# Patient Record
Sex: Male | Born: 1940 | Race: Black or African American | Hispanic: No | Marital: Married | Smoking: Former smoker
Health system: Southern US, Community
[De-identification: ages and names within clinical notes are randomized; demographics above are authoritative.]

## PROBLEM LIST (undated history)

## (undated) DIAGNOSIS — I10 Essential (primary) hypertension: Secondary | ICD-10-CM

## (undated) DIAGNOSIS — K859 Acute pancreatitis without necrosis or infection, unspecified: Secondary | ICD-10-CM

## (undated) DIAGNOSIS — I219 Acute myocardial infarction, unspecified: Secondary | ICD-10-CM

## (undated) DIAGNOSIS — G4730 Sleep apnea, unspecified: Secondary | ICD-10-CM

## (undated) DIAGNOSIS — J45909 Unspecified asthma, uncomplicated: Secondary | ICD-10-CM

## (undated) DIAGNOSIS — E785 Hyperlipidemia, unspecified: Secondary | ICD-10-CM

## (undated) DIAGNOSIS — L309 Dermatitis, unspecified: Secondary | ICD-10-CM

## (undated) DIAGNOSIS — I2510 Atherosclerotic heart disease of native coronary artery without angina pectoris: Secondary | ICD-10-CM

## (undated) DIAGNOSIS — Z720 Tobacco use: Secondary | ICD-10-CM

## (undated) DIAGNOSIS — N182 Chronic kidney disease, stage 2 (mild): Secondary | ICD-10-CM

## (undated) DIAGNOSIS — G629 Polyneuropathy, unspecified: Secondary | ICD-10-CM

## (undated) DIAGNOSIS — I509 Heart failure, unspecified: Secondary | ICD-10-CM

## (undated) DIAGNOSIS — M109 Gout, unspecified: Secondary | ICD-10-CM

## (undated) DIAGNOSIS — N183 Chronic kidney disease, stage 3 unspecified: Secondary | ICD-10-CM

## (undated) DIAGNOSIS — K5790 Diverticulosis of intestine, part unspecified, without perforation or abscess without bleeding: Secondary | ICD-10-CM

## (undated) DIAGNOSIS — I739 Peripheral vascular disease, unspecified: Secondary | ICD-10-CM

## (undated) DIAGNOSIS — R51 Headache: Secondary | ICD-10-CM

## (undated) HISTORY — DX: Dermatitis, unspecified: L30.9

## (undated) HISTORY — PX: TONSILLECTOMY: SUR1361

## (undated) HISTORY — PX: ADENOIDECTOMY: SUR15

## (undated) HISTORY — DX: Gout, unspecified: M10.9

## (undated) HISTORY — DX: Chronic kidney disease, stage 3 unspecified: N18.30

## (undated) HISTORY — DX: Sleep apnea, unspecified: G47.30

## (undated) HISTORY — DX: Hyperlipidemia, unspecified: E78.5

## (undated) HISTORY — DX: Chronic kidney disease, stage 2 (mild): N18.2

## (undated) HISTORY — DX: Polyneuropathy, unspecified: G62.9

## (undated) HISTORY — PX: FEMORAL-FEMORAL BYPASS GRAFT: SHX936

## (undated) HISTORY — DX: Unspecified asthma, uncomplicated: J45.909

## (undated) HISTORY — DX: Acute pancreatitis without necrosis or infection, unspecified: K85.90

## (undated) HISTORY — PX: REPLACEMENT TOTAL HIP W/  RESURFACING IMPLANTS: SUR1222

---

## 2002-04-07 HISTORY — PX: CORONARY ANGIOPLASTY WITH STENT PLACEMENT: SHX49

## 2004-03-25 ENCOUNTER — Encounter (HOSPITAL_COMMUNITY): Admission: RE | Admit: 2004-03-25 | Discharge: 2004-06-23 | Payer: Self-pay | Admitting: Internal Medicine

## 2004-06-03 ENCOUNTER — Ambulatory Visit (HOSPITAL_COMMUNITY): Admission: RE | Admit: 2004-06-03 | Discharge: 2004-06-03 | Payer: Self-pay | Admitting: Internal Medicine

## 2004-08-19 ENCOUNTER — Encounter: Admission: RE | Admit: 2004-08-19 | Discharge: 2004-08-19 | Payer: Self-pay | Admitting: Sports Medicine

## 2004-09-16 ENCOUNTER — Ambulatory Visit (HOSPITAL_COMMUNITY): Admission: RE | Admit: 2004-09-16 | Discharge: 2004-09-16 | Payer: Self-pay | Admitting: Internal Medicine

## 2004-11-08 ENCOUNTER — Encounter: Admission: RE | Admit: 2004-11-08 | Discharge: 2004-11-08 | Payer: Self-pay | Admitting: Orthopedic Surgery

## 2005-01-21 ENCOUNTER — Inpatient Hospital Stay (HOSPITAL_COMMUNITY): Admission: RE | Admit: 2005-01-21 | Discharge: 2005-01-24 | Payer: Self-pay | Admitting: Orthopedic Surgery

## 2006-02-13 ENCOUNTER — Emergency Department (HOSPITAL_COMMUNITY): Admission: EM | Admit: 2006-02-13 | Discharge: 2006-02-13 | Payer: Self-pay | Admitting: Emergency Medicine

## 2006-05-27 ENCOUNTER — Encounter: Admission: RE | Admit: 2006-05-27 | Discharge: 2006-05-27 | Payer: Self-pay | Admitting: Internal Medicine

## 2006-07-28 HISTORY — PX: DOPPLER ECHOCARDIOGRAPHY: SHX263

## 2007-01-18 ENCOUNTER — Inpatient Hospital Stay (HOSPITAL_COMMUNITY): Admission: EM | Admit: 2007-01-18 | Discharge: 2007-01-20 | Payer: Self-pay | Admitting: Emergency Medicine

## 2007-09-20 ENCOUNTER — Ambulatory Visit (HOSPITAL_COMMUNITY): Admission: RE | Admit: 2007-09-20 | Discharge: 2007-09-20 | Payer: Self-pay | Admitting: Cardiology

## 2007-09-20 HISTORY — PX: LOWER EXTREMITY ANGIOGRAM: SHX5955

## 2009-05-08 DIAGNOSIS — K5790 Diverticulosis of intestine, part unspecified, without perforation or abscess without bleeding: Secondary | ICD-10-CM

## 2009-05-08 HISTORY — DX: Diverticulosis of intestine, part unspecified, without perforation or abscess without bleeding: K57.90

## 2009-05-21 ENCOUNTER — Ambulatory Visit (HOSPITAL_COMMUNITY): Admission: RE | Admit: 2009-05-21 | Discharge: 2009-05-21 | Payer: Self-pay | Admitting: Gastroenterology

## 2010-06-03 ENCOUNTER — Emergency Department (HOSPITAL_COMMUNITY)
Admission: EM | Admit: 2010-06-03 | Discharge: 2010-06-03 | Disposition: A | Discharge status: Home / Self Care | Payer: Medicare Other | Attending: Emergency Medicine | Admitting: Emergency Medicine

## 2010-06-03 DIAGNOSIS — E119 Type 2 diabetes mellitus without complications: Secondary | ICD-10-CM | POA: Insufficient documentation

## 2010-06-03 DIAGNOSIS — E7800 Pure hypercholesterolemia, unspecified: Secondary | ICD-10-CM | POA: Insufficient documentation

## 2010-06-03 DIAGNOSIS — I10 Essential (primary) hypertension: Secondary | ICD-10-CM | POA: Insufficient documentation

## 2010-06-03 DIAGNOSIS — S61409A Unspecified open wound of unspecified hand, initial encounter: Secondary | ICD-10-CM | POA: Insufficient documentation

## 2010-06-03 DIAGNOSIS — Y990 Civilian activity done for income or pay: Secondary | ICD-10-CM | POA: Insufficient documentation

## 2010-06-03 DIAGNOSIS — Y929 Unspecified place or not applicable: Secondary | ICD-10-CM | POA: Insufficient documentation

## 2010-06-03 DIAGNOSIS — I519 Heart disease, unspecified: Secondary | ICD-10-CM | POA: Insufficient documentation

## 2010-06-03 DIAGNOSIS — W268XXA Contact with other sharp object(s), not elsewhere classified, initial encounter: Secondary | ICD-10-CM | POA: Insufficient documentation

## 2010-06-26 LAB — GLUCOSE, CAPILLARY
Glucose-Capillary: 70 mg/dL (ref 70–99)
Glucose-Capillary: 80 mg/dL (ref 70–99)

## 2010-08-20 NOTE — H&P (Signed)
Zachary Hoffman, Zachary Hoffman               ACCOUNT NO.:  [id number]   MEDICAL RECORD NO.:  [id number]          PATIENT TYPE:  INP   LOCATION:  6738                         FACILITY:  [facility]   PHYSICIAN:  [name], M.D.    DATE OF BIRTH:  [birth year]   DATE OF ADMISSION:  01/17/2007  DATE OF DISCHARGE:                              HISTORY & PHYSICAL   PRESENTING COMPLAINT:  Nausea and gaseous bloating.   HISTORY OF PRESENTING ILLNESS:  Zachary Hoffman is a 70 year old African  American gentleman with past medical history significant for type 2  diabetes mellitus, hypertension, coronary artery disease, peripheral  vascular disease, osteoarthritis of the hips and knees, tobacco abuse,  and peripheral neuropathy.  He presented to the emergency room at [facility] with 1-day history of sudden onset nausea, gaseous  bloating of the abdomen, heartburn and belching.  He stated he had one  episode of vomiting of a yellowish material without any blood.  He  denied any abdominal pain.  No diarrhea or constipation but he recall  eating a lot of macaroni and cheese and believes this blocked him up.  He had no melena or hematochezia.  He denied having any chest pain,  shortness of breath, orthopnea or PND.  He denies using any alcohol or  nonsteroidal anti-inflammatory drugs.  He had no fevers or chills.  At  the emergency room, he had persistent vomiting and had to have an NG  tube placed.  His vital signs were stable at the emergency room and  initial laboratory data did show slightly elevated creatinine at 1.74.  His liver function tests essentially were within normal limits.  Point-  of-care CPK was also normal and troponin.  Abdominal x-ray, however,  revealed small bowel with gaseous distention and air-fluid levels  suggestive of small-bowel obstruction.  He was therefore admitted to the  hospital for further evaluation and appropriate management.   PAST MEDICAL HISTORY:  1. Type 2 diabetes  mellitus.  His last hemoglobin A1c on Aug 17, 2006,      was 8.12.  2. Hypertension, systemic.  3. Coronary artery disease.  He is usually followed by Dr. Adrian Prows.      His last 2-D echocardiogram was July 28, 2006, with normal LV      systolic function and diastolic dysfunction.  Stress Myoview at      that time revealed no ischemia and a low-risk scan.  4. Tobacco abuse.  He continues to smoke cigarettes, about five per      day.  5. Peripheral vascular disease.  6. Arthritis of the hips and knees status post left total hip      replacement.  7. Hyperlipidemia.   PAST SURGICAL HISTORY:  1. Angioplasty and stent.  2. Femoral artery bypass surgery.  3. Right leg stent.  4. Left total hip replacement.   MEDICATION HISTORY:  He is on:  1. Aspirin 325 mg once a day.  2. Accupril 40 mg daily.  3. Niaspan 500 mg daily.  4. Nexium 40 mg daily.  5. Toprol-XL 100 mg daily.  6. Albuterol MDI two puffs q.6h. p.r.n.  7. Darvocet-N 100 one p.o. q.12h. p.r.n.  8. Januvia 100 mg daily.  9. Flexeril 10 mg t.i.d. p.r.n.  10.Lantus insulin 60 units q.h.s.  11.Lyrica 50 mg t.i.d.  12.Apidra insulin 10 units b.i.d. a.c.  13.Cialis 20 mg daily p.r.n.  14.Zocor 20 mg daily.  15.Azor 10/20 one p.o. daily.  16.Reglan 5 mg t.i.d. a.c.  17.Lasix 40 mg b.i.d.  18.Nasacort AQ two sprays each nostril once a day.   ALLERGIES:  He says AVANDIA causes hepatitis.   FAMILY HISTORY AND SOCIAL HISTORY:  He is married and lives with his  wife.  He smokes five cigarettes per day and has done so for the last 40  years.  He denies any use of alcohol or illicit drugs.  His father is  deceased from cancer.  His mother died from a stroke.  He has one  sibling with diabetes and has three healthy children.   REVIEW OF SYSTEMS:  Essentially as above.   PHYSICAL EXAMINATION:  He is lying a hospital bed, not in acute  respiratory or painful distress.  He is not pale.  He is not icteric.  He is not cyanosed.   He is well-hydrated.  INITIAL VITAL SIGNS:  Show a blood pressure of 160/83, heart rate of 97,  respiratory rate of 20, temperature 98.1 and O2 saturation on room air  is 96%.  HEENT:  Pupils are equal and react to light and accommodation.  NECK:  Supple with no elevated JVD, no cervical lymphadenopathy, no  carotid bruit.  CHEST:  Shows good air entry bilaterally with no rales, rhonchi or  wheezes.  Heart sounds 1 and 2 are heard.  No S3 gallop.  ABDOMEN:  Obese, soft, nontender, no palpable masses.  Bowel sounds are present.  EXTREMITIES:  Shows no edema, no calf tenderness or swelling.  Peripheral pulses are weak.  CENTRAL NERVOUS SYSTEM:  He is alert, oriented x2 with no focal  neurological deficits.   LABORATORY DATA:  Shows a white count of 8.4, hemoglobin 13.8, platelet  count 304, hematocrit 42.3.  Sodium is 144, chloride 109, potassium 3.8,  bicarbonate of 27, BUN is 18, creatinine 1.44, and glucose of 140.  AST  is 25, ALT 27, alkaline phosphatase 96, and total bilirubin is 0.9.  Lipase is 14, albumin is 4.3.  CPK and troponin x2 are normal.  Total  cholesterol of 83, LDL 48, HDL 21, triglycerides of 70.  Calcium is 9.1.  Chest x-ray reported as showing no acute infiltrates.  Abdominal x-ray:  Bowel gas pattern compatible with small-bowel obstruction.  Urinalysis  is negative for leukocyte esterase or nitrites.   ASSESSMENT:  Zachary Hoffman is a 70 year old African American  gentleman with past medical history significant for coronary artery  disease, systemic hypertension, diabetes mellitus, peripheral vascular  disease.  His last colonoscopy was in 2005 and according to him was  normal, performed in [place].  He now presents with symptoms and signs  suggestive of small-bowel obstruction, most likely due to ileus.   ADMISSION DIAGNOSIS:  1. Small-bowel ileus.  2. Type 2 diabetes mellitus.  3. Systemic hypertension.  4. Gastroesophageal reflux disease.   PLAN OF  CARE:  He will be admitted to a telemetry bed.  NG tube suction  will be performed until stable, and he will be started on clear liquids  and advanced as tolerated.  CBG is to be checked a.c. and h.s. and  covered with sliding scale NovoLog insulin.  His home medication will be  continued as above.  If he fails to respond to this conservative line of  treatment, a consult will be requested with gastroenterology to proceed  with endoscopy during hospitalization; otherwise, this can be performed  in the outpatient once he is more stable.  This plan of care has been  discussed with him and his questions answered.      [name], M.D.  Electronically Signed     EA/MEDQ  D:  01/18/2007  T:  01/19/2007  Job:  [id number]

## 2010-08-20 NOTE — Cardiovascular Report (Signed)
NAME:  Zachary Hoffman, Zachary Hoffman               ACCOUNT NO.:  [id number]   MEDICAL RECORD NO.:  [id number]          PATIENT TYPE:  AMB   LOCATION:  SDS                          FACILITY:  [facility]   PHYSICIAN:  Ulice Dash R. Einar Gip, MD       DATE OF BIRTH:  22-Jun-1940   DATE OF PROCEDURE:  09/20/2007  DATE OF DISCHARGE:                            CARDIAC CATHETERIZATION   ATTENDING PHYSICIAN:  [name], M.D.   PROCEDURES PERFORMED:  1. Pelvic aortogram.  2. Left iliac arteriogram with left femoral runoff.   INDICATIONS:  [name] is a 58-year gentleman with history of  smoking which he has recently quit and history of coronary artery  disease with a negative stress test in January 2009 who has been  complaining of left hip pain and left leg pain.  He has previously  undergone left femoro-femoral bypass surgery in [place], details of  which were not known.  He had undergone outpatient Doppler evaluation  which had suggested stenosis at the inflow of the graft of greater than  50%.  With his symptoms of claudication and new finding of stenosis  proximal to the graft or at the insertion of the proximal segment of the  graft he was brought to the catheterization lab to evaluate his  peripheral anatomy.   Pelvic Aortogram:  Pelvic aortogram revealed aortoiliac bifurcation to  be widely patent.  There was mild calcification.  The left external  iliac artery showed 10% to 20% luminal irregularity but was otherwise  widely patent.   Left iliac arteriogram with left femoral runoff:  The left femoral  runoff revealed that there was mild luminal irregularity in the left  iliac artery and left common femoral artery, and the left SFA was  occluded.  This was bypassed from left femoro-femoral bypass Dacron  graft.  This graft is widely patent.  The insertions both proximally and  distally are widely patent.   At the level of the knee, the popliteal artery is widely patent.   Below the left knee,  the anterior tibial artery is occluded and the  tibioperoneal trunk has a smooth 50% stenosis.  The posterior tibial  artery is also occluded.  There is one-vessel runoff in the form of a  large peroneal artery.  This peroneal artery gives collaterals to both  posterior tibial and anterior tibial arteries at the level of the ankle.   RECOMMENDATIONS:  The Doppler velocities are probably elevated secondary  to tortuosity, but no significant stenosis is noted.  We will continue  present aggressive medical therapy.  I congratulated Zachary Hoffman for  having quit smoking recently.  He will follow up with Dr. [name]  for further management and evaluation of his medical issues.  I will see  him back in 6 months to a year for coronary-disease and peripheral-  arterial-disease followup.   A total of 50 mL of contrast was utilized for diagnostic angiography.   TECHNIQUE OF THE PROCEDURE:  Under usual sterile precautions using a 5-  French right femoral arterial access, a Omni Flush 5-French catheter was  utilized to perform pelvic aortogram.  The same catheter was utilized to  engage the left common iliac artery, and crossover was performed with a  Wholey wire.  The Omni Flush catheter  was placed into the left external iliac artery, and left external iliac  arteriogram with left femoral runoff was performed.  The catheter was  then pulled out of the body.  The patient tolerated the procedure well.  No immediate complication noted.      [name], MD  Electronically Signed     [name], MD  Electronically Signed    JRG/MEDQ  D:  09/20/2007  T:  09/20/2007  Job:  [id number]   cc:   [name], M.D.

## 2010-08-23 NOTE — H&P (Signed)
[name], [name]               ACCOUNT NO.:  [id number]   MEDICAL RECORD NO.:  [id number]          PATIENT TYPE:  INP   LOCATION:  NA                           FACILITY:  [facility]   PHYSICIAN:  Pietro Cassis. Alvan Dame, M.D.  DATE OF BIRTH:  Oct 04, 1940   DATE OF ADMISSION:  01/21/2005  DATE OF DISCHARGE:                                HISTORY & PHYSICAL   PRINCIPAL DIAGNOSIS:  Right hip osteoarthritis.   SECONDARY DIAGNOSES:  1.  Hypertension.  2.  History of myocardial infarction.  3.  History of peripheral vascular disease.  4.  History of insulin-dependent diabetes.  5.  History of thyroid disease.  6.  Osteoarthritis.  7.  Degenerative disk disease.  8.  History of peripheral vascular lower extremity arterial bypass surgery.   HISTORY OF PRESENT ILLNESS:  Zachary Hoffman is a 70 year old gentleman who is  here for evaluation. He had been followed in the office for right hip pain.  He moved from [place] earlier this year and had previously been seen  and evaluated for right hip pain. He has had evidence of persistent right  groin pain that had not relieved with any conservative measures including  cortisone injections. MRI revealed evidence of degenerative changes. He is  noted to have significant medial sided cyst formation. His MRI revealed  advanced osteoarthritic changes of the right hip with some iliopsoas  bursitis. Injections have failed to provide relief. Given these parameters,  he wished to proceed with surgical intervention as he has been relegated to  using a cane and walker at this point to get around without pain.   PAST MEDICAL HISTORY:  Hypertension, myocardial infarction, peripheral  vascular disease, insulin-dependent diabetes, thyroid disease,  osteoarthritis, and degenerative disk disease.   PAST SURGICAL HISTORY:  1.  Includes lower extremity vascular bypass.  2.  Coronary stent placement and right lower extremity stent placement.   FAMILY HISTORY:   Includes stroke, osteoarthritis, diabetes, and bone cancer.   ALLERGIES:  No known drug allergies.   CURRENT MEDICATIONS:  Ibuprofen, Vytorin, Norvasc, Nexium, aspirin,  bisulfate, Centrum, vitamin C, vitamin E, Humalog, Toprol XL, Lantus,  Niaspan, and Quinapril.   REVIEW OF SYSTEMS:  Is otherwise unremarkable for any recent problems with  upper respiratory, pulmonary, cardiac, gastrointestinal, or genitourinary  issues over the past 2 to 3 weeks.   PHYSICAL EXAMINATION:  GENERAL:  An awake, alert, oriented male today.  VITAL SIGNS:  Temperature 98.5, pulse 68, blood pressure 128/72.  HEENT:  Normal examination. He does have some loss of teeth but no evidence  of any obvious infection at this point. He otherwise has no evidence of any  facial asymmetry. Has normal speech pattern.  NECK:  Normal examination. Supple without any lymph nodes palpable. He has  no bruits detected.  CHEST:  Clear to auscultation bilaterally.  HEART:  Regular rate and rhythm with no murmurs detected.  ABDOMEN:  Soft, nontender with palpable bowel sounds.  EXTREMITIES:  Examination reveals again that he is relying on a walker at  this point to get around, with pain in the  groin with internal and external  rotation, limited range of motion.  NEUROLOGIC:  He is otherwise neurovascularly intact.  SKIN:  Intact and no evidence of cellulitis around the right hip.   LABORATORY DATA:  Radiographs reveal degenerative changes of the right hip  with cystic formation in the femoral head. The MRI supported these claims  with no evidence of any other significant pathology.   ASSESSMENT:  End stage right hip osteoarthritis, primarily medially, with  intra-femoral cystic formation.   PLAN:  [name] will be admitted for surgery on January 21, 2005 for right  hip surgery. He will undergo right total hip replacement with plans for  release of his iliopsoas bursa. Risks and benefits reviewed in the office  today.  Consent will be obtained based on this visit.      Pietro Cassis Alvan Dame, M.D.  Electronically Signed     MDO/MEDQ  D:  01/19/2005  T:  01/19/2005  Job:  [id number]

## 2010-08-23 NOTE — Op Note (Signed)
[name], [name] NO.:  [id number]   MEDICAL RECORD NO.:  [id number]          PATIENT TYPE:  INP   LOCATION:  X003                         FACILITY:  [facility]   PHYSICIAN:  Zachary Hoffman, M.D.  DATE OF BIRTH:  [birth year]   DATE OF PROCEDURE:  01/21/2005  DATE OF DISCHARGE:                                 OPERATIVE REPORT   PREOPERATIVE DIAGNOSIS:  Right hip osteoarthritis.   POSTOPERATIVE DIAGNOSIS:  Right hip osteoarthritis.   PROCEDURE:  Right total hip replacement.   COMPONENTS USED:  A DePuy hip system with a size 52 pinnacle cup, 36 neutral  metal liner, one cancellous bone screw. A Trilock hip stem with a size 11.3  lateralized offset, 36 plus 1.5 ball.   SURGEON:  Zachary Hoffman, M.D.   ASSISTANT:  Bobbye Morton, P.A.-C.   ANESTHESIA:  General.   ESTIMATED BLOOD LOSS:  350.   DRAINS:  Drains x1.   COMPLICATIONS:  None.   DISPOSITION:  Stable to recovery room.   INDICATIONS FOR PROCEDURE:  Zachary Hoffman is a pleasant 70 year old black male  who I have been following for progressive right hip pain. I initially  presented on consultation from out of town. He has failed all conservative  measures including cortisone injections which failed to provide longstanding  relief. Radiographs including MRI revealed significant advanced degenerative  changes in the right hip.   After reviewing the risks and benefits of neurovascular damage, dislocation,  component failure, the patient was consented for right total hip  replacement.   DESCRIPTION OF PROCEDURE:  The patient was brought to the operative theater.  Once adequate anesthesia and preoperative antibiotics, 1 gram of Ancef, the  patient was positioned in the left lateral decubitus position with the right  side up. The right lower extremity was then prepped and draped in a sterile  fashion following a prescrub. A lateral based incision was made for  posterior approach to the hip. The patient's  soft tissues including  subcutaneous tissue, fat, and muscle tendon were extremely tight. This made  exposure a little bit more of a challenge. The gluteus maximus was not  released though it probably would have helped in terms of exposure though  that is  a technique that is not performed readily at this point. I did  taken down a portion of the quadratus which tried to save.   The femoral head was dislocated, femoral neck osteotomy was made based off  the anatomic landmarks and preoperative templates. At this point, attention  was directed to the femur. Femoral exposure was obtained with appropriate  retractors. A starting box cut was made with about 20 degrees of  anteversion. I then placed a starting reamer then followed by broaching.  Broaching was carried up sequentially to initially a size 10. At this point,  attention was directed to the acetabulum.   Acetabular exposure was obtained including labrectomy and anterior  capsulotomy. Retractors placed and exposure obtained. Initial reaming was  carried out with a 45 reamer and then carried up sequentially with the mini  reamers to a 51  reamer. This had provided excellent bone preparation. The  final 52 cup was then impacted with excellent stable scratch fit. A single  screw was placed in the superior ileum with excellent purchase. A manual  cover was positioned followed by placement of a trial liner. At this point,  attention was redirected back to the femur where the size 10 broach was  placed. A trial reduction was carried out. The patient's leg lengths were  symmetric to that examined preoperatively with a let down leg. Hip stability  was excellent. Initially I used a standard offset neck. However, there was a  slight bit of impingement anteriorly and the iliotibial band appeared to be  loose so I went with the lateralized offset. This maintained leg lengths but  provided extra stability. The patient was stable in the sleep  position.  Adduction to 40 degrees, internal rotation to 80 degrees, stable in neutral  abduction, flexion to 80 degrees and internal rotation to 80 degrees. The  patient's tissues were tight enough that there was no shucking; however, leg  lengths were equal. The patient also tolerated external rotation and  extension. Given these parameters, I reexamined the proximal femur with a  size 10 broach and there was some torsional movement so I decided to broach  up to 11.3. This had excellent torsional stability. The final 11.3  lateralized stem was then brought onto the field.   The final acetabular liner, 36 neutral liner was positioned and impacted  into place without complications. The final 11.3 lateralized stem was then  impacted into position to the level of the broach placement. The final 36  plus 1.5 ball was then  impacted onto a clean and dry trunnion, hip reduced.  The hip was irrigated throughout the case and in particular at the end of  the case. A medium hemovac drain was placed. The posterior capsule was  reapproximated to the posterior leaflet. The remainder of the wound was  closed in layers with #1 Ethibond on the iliotibial band. The #1 Vicryl was  used on the gluteus maximus fascia. The wound was closed in layers with 4-0  Monocryl on the skin.      Zachary Hoffman, M.D.  Electronically Signed     MDO/MEDQ  D:  01/21/2005  T:  01/21/2005  Job:  [id number]

## 2010-08-23 NOTE — Discharge Summary (Signed)
Zachary Hoffman, Zachary Hoffman               ACCOUNT NO.:  [id number]   MEDICAL RECORD NO.:  [id number]          PATIENT TYPE:  INP   LOCATION:  [place]                         FACILITY:  [facility]   PHYSICIAN:  Pietro Cassis. Alvan Dame, M.D.  DATE OF BIRTH:  [birth year]   DATE OF ADMISSION:  01/21/2005  DATE OF DISCHARGE:  01/24/2005                                 DISCHARGE SUMMARY   ADMISSION DIAGNOSES:  1.  Right hip osteoarthritis.  2.  Hypertension.  3.  History of myocardial infarction.  4.  History of peripheral vascular disease.  5.  Insulin-dependent diabetes.  6.  Thyroid disease.  7.  Osteoarthritis.  8.  Degenerative disk disease.  9.  History of peripheral vascular lower extremity and arterial bypass      surgery.   DISCHARGE DIAGNOSES:  1.  Right hip osteoarthritis.  2.  Hypertension.  3.  History of myocardial infarction.  4.  History of peripheral vascular disease.  5.  Insulin-dependent diabetes.  6.  Thyroid disease.  7.  Osteoarthritis.  8.  Degenerative disk disease.  9.  History of peripheral vascular lower extremity and arterial bypass      surgery.   OPERATION:  On January 21, 2005 the patient underwent right total hip  replacement arthroplasty utilizing Dupuy hip system.  Verner Chol  assisted.   BRIEF HISTORY:  This 70 year old black male had been seen, but for  progressive problems concerning his right hip.  He has had cortisone  injection to the hip as well as taken anti-inflammatories in hopes to  prolong hip surgery due to his age.  He now is quite miserable, having  difficulty getting about.  After risks and benefits of surgery, and much  consideration by the patient, he agreed to go ahead with the above  procedure.   COURSE IN THE HOSPITAL:  The patient tolerated the surgical procedure quite  well.  Neurovascularly he remained intact in the operative extremity and  dressing and wound remained dry.  He had some itching postoperatively, we  were unsure if this  might be something given to him in the anesthesia, or  some of the analgesics, but we gave him Benadryl and this essentially took  care of the problem.  Eventually we weaned him from his PCA, Foley was out,  Hemovac was removed.  The patient was ambulating in the hall, doing really  quite well with physical therapy.  Home arrangements were made through  [place], home equipment was provided for the patient's safety.  It was felt  he could be maintained at home and as he was able to maintain 50% weight-  bearing, and arrangements were made for that discharge.  Laboratory values  in the hospital hematologically showed a CBC on admission completely within  normal limits, hemoglobin 13.3, hematocrit 40.0, final hemoglobin 10.5 with  hematocrit of 30.0.  Blood chemistries were normal.  Urinalysis was negative  for urinary tract infection.  X-ray showed osteoarthritis of the right hip  preop and no active disease was seen on chest x-ray.  No electrocardiogram  seen on this chart.  CONDITION ON DISCHARGE:  Improved, stable.   PLAN:  The patient is discharged home to continue with home health, 50%  weight-bearing in the operative extremity.  Use dry pressing p.r.n.  Continue home medications and diet.  We gave him a prescription for Vicodin  for pain, Robaxin as a muscle relaxant, and a prescription for Coumadin on  which he will stay for four weeks after date of surgery.  He will return to  see Dr. Alvan Dame in 2 to 2-1/2 weeks after the date of surgery.  He may shower  at this point in time.  Call with any problems or questions.      Zachary Hoffman.      Pietro Cassis Alvan Dame, M.D.  Electronically Signed    DLU/MEDQ  D:  02/03/2005  T:  02/03/2005  Job:  [id number]   cc:   Durenda Guthrie, MD  [facility]

## 2010-09-17 HISTORY — PX: OTHER SURGICAL HISTORY: SHX169

## 2011-01-02 LAB — TSH: TSH: 0.206 — ABNORMAL LOW

## 2011-01-02 LAB — BASIC METABOLIC PANEL
BUN: 19
CO2: 26
Calcium: 9.5
Chloride: 103
Creatinine, Ser: 1.7 — ABNORMAL HIGH
GFR calc Af Amer: 49 — ABNORMAL LOW
GFR calc non Af Amer: 41 — ABNORMAL LOW
Glucose, Bld: 301 — ABNORMAL HIGH
Potassium: 4.2
Sodium: 136

## 2011-01-02 LAB — T4: T4, Total: 6.2

## 2011-01-02 LAB — T3: T3, Total: 126.2 (ref 80.0–204.0)

## 2011-01-15 LAB — BASIC METABOLIC PANEL
BUN: 8
CO2: 26
Calcium: 9.3
Chloride: 107
Creatinine, Ser: 1.27
GFR calc Af Amer: >60
GFR calc non Af Amer: 57 — ABNORMAL LOW
Glucose, Bld: 143 — ABNORMAL HIGH
Potassium: 4.1
Sodium: 139

## 2011-01-16 LAB — CBC
HCT: 36 — ABNORMAL LOW
HCT: 42.3
Hemoglobin: 13.8
MCHC: 32.7
MCV: 86.1
Platelets: 216
Platelets: 304
RBC: 4.92
RDW: 16.2 — ABNORMAL HIGH
RDW: 16.6 — ABNORMAL HIGH
WBC: 5.5
WBC: 8.4

## 2011-01-16 LAB — COMPREHENSIVE METABOLIC PANEL
ALT: 27
AST: 18
AST: 25
Albumin: 3.2 — ABNORMAL LOW
Albumin: 4.3
Alkaline Phosphatase: 75
Alkaline Phosphatase: 96
BUN: 23
BUN: 7
CO2: 27
Calcium: 9.9
Chloride: 101
Chloride: 109
Creatinine, Ser: 1.74 — ABNORMAL HIGH
GFR calc Af Amer: 48 — ABNORMAL LOW
GFR calc Af Amer: >60
GFR calc non Af Amer: 40 — ABNORMAL LOW
Glucose, Bld: 190 — ABNORMAL HIGH
Potassium: 4
Potassium: 4.6
Sodium: 139
Sodium: 141
Total Bilirubin: 0.9
Total Bilirubin: 0.9
Total Protein: 6.2
Total Protein: 8.5 — ABNORMAL HIGH

## 2011-01-16 LAB — URINALYSIS, ROUTINE W REFLEX MICROSCOPIC
Bilirubin Urine: NEGATIVE
Glucose, UA: NEGATIVE
Hgb urine dipstick: NEGATIVE
Ketones, ur: NEGATIVE
Leukocytes, UA: NEGATIVE
Nitrite: NEGATIVE
Protein, ur: 30 — AB
Specific Gravity, Urine: 1.021
Urobilinogen, UA: 0.2
pH: 5.5

## 2011-01-16 LAB — LIPASE, BLOOD: Lipase: 14

## 2011-01-16 LAB — CARDIAC PANEL(CRET KIN+CKTOT+MB+TROPI)
Relative Index: 3.4 — ABNORMAL HIGH
Total CK: 128
Troponin I: 0.02

## 2011-01-16 LAB — DIFFERENTIAL
Basophils Absolute: 0
Basophils Relative: 0
Eosinophils Absolute: 0
Eosinophils Relative: 0
Lymphocytes Relative: 17
Lymphs Abs: 1.4
Monocytes Absolute: 0.5
Monocytes Relative: 7
Neutro Abs: 6.4
Neutrophils Relative %: 76

## 2011-01-16 LAB — TROPONIN I: Troponin I: 0.01

## 2011-01-16 LAB — BASIC METABOLIC PANEL
Chloride: 109
GFR calc Af Amer: 60 — ABNORMAL LOW
Potassium: 3.8

## 2011-01-16 LAB — LIPID PANEL
Cholesterol: 83
HDL: 21 — ABNORMAL LOW
Total CHOL/HDL Ratio: 4
VLDL: 14

## 2011-01-16 LAB — URINE MICROSCOPIC-ADD ON

## 2011-06-09 ENCOUNTER — Inpatient Hospital Stay (HOSPITAL_COMMUNITY)
Admission: EM | Admit: 2011-06-09 | Discharge: 2011-06-11 | DRG: 439 | Disposition: A | Discharge status: Home / Self Care | Payer: Medicare Other | Source: Ambulatory Visit | Attending: Internal Medicine | Admitting: Internal Medicine

## 2011-06-09 DIAGNOSIS — I509 Heart failure, unspecified: Secondary | ICD-10-CM | POA: Diagnosis present

## 2011-06-09 DIAGNOSIS — I252 Old myocardial infarction: Secondary | ICD-10-CM

## 2011-06-09 DIAGNOSIS — N189 Chronic kidney disease, unspecified: Secondary | ICD-10-CM | POA: Diagnosis present

## 2011-06-09 DIAGNOSIS — I129 Hypertensive chronic kidney disease with stage 1 through stage 4 chronic kidney disease, or unspecified chronic kidney disease: Secondary | ICD-10-CM | POA: Diagnosis present

## 2011-06-09 DIAGNOSIS — Z23 Encounter for immunization: Secondary | ICD-10-CM

## 2011-06-09 DIAGNOSIS — Z7982 Long term (current) use of aspirin: Secondary | ICD-10-CM

## 2011-06-09 DIAGNOSIS — N289 Disorder of kidney and ureter, unspecified: Secondary | ICD-10-CM

## 2011-06-09 DIAGNOSIS — K5730 Diverticulosis of large intestine without perforation or abscess without bleeding: Secondary | ICD-10-CM | POA: Diagnosis present

## 2011-06-09 DIAGNOSIS — I10 Essential (primary) hypertension: Secondary | ICD-10-CM

## 2011-06-09 DIAGNOSIS — I739 Peripheral vascular disease, unspecified: Secondary | ICD-10-CM | POA: Diagnosis present

## 2011-06-09 DIAGNOSIS — E119 Type 2 diabetes mellitus without complications: Secondary | ICD-10-CM | POA: Diagnosis present

## 2011-06-09 DIAGNOSIS — I219 Acute myocardial infarction, unspecified: Secondary | ICD-10-CM

## 2011-06-09 DIAGNOSIS — K859 Acute pancreatitis without necrosis or infection, unspecified: Principal | ICD-10-CM | POA: Diagnosis present

## 2011-06-09 DIAGNOSIS — Z96649 Presence of unspecified artificial hip joint: Secondary | ICD-10-CM

## 2011-06-09 DIAGNOSIS — Z794 Long term (current) use of insulin: Secondary | ICD-10-CM

## 2011-06-09 DIAGNOSIS — N179 Acute kidney failure, unspecified: Secondary | ICD-10-CM | POA: Diagnosis present

## 2011-06-09 HISTORY — DX: Heart failure, unspecified: I50.9

## 2011-06-09 HISTORY — DX: Essential (primary) hypertension: I10

## 2011-06-09 HISTORY — DX: Acute myocardial infarction, unspecified: I21.9

## 2011-06-09 HISTORY — DX: Tobacco use: Z72.0

## 2011-06-09 HISTORY — DX: Headache: R51

## 2011-06-09 HISTORY — DX: Peripheral vascular disease, unspecified: I73.9

## 2011-06-09 HISTORY — DX: Diverticulosis of intestine, part unspecified, without perforation or abscess without bleeding: K57.90

## 2011-06-09 HISTORY — DX: Atherosclerotic heart disease of native coronary artery without angina pectoris: I25.10

## 2011-06-10 ENCOUNTER — Emergency Department (HOSPITAL_COMMUNITY): Payer: Medicare Other

## 2011-06-10 ENCOUNTER — Inpatient Hospital Stay (HOSPITAL_COMMUNITY): Payer: Medicare Other

## 2011-06-10 ENCOUNTER — Encounter (HOSPITAL_COMMUNITY): Payer: Self-pay | Admitting: Emergency Medicine

## 2011-06-10 DIAGNOSIS — I10 Essential (primary) hypertension: Secondary | ICD-10-CM | POA: Diagnosis present

## 2011-06-10 DIAGNOSIS — N289 Disorder of kidney and ureter, unspecified: Secondary | ICD-10-CM | POA: Diagnosis present

## 2011-06-10 LAB — POCT I-STAT, CHEM 8
BUN: 22 mg/dL (ref 6–23)
Calcium, Ion: 1.27 mmol/L (ref 1.12–1.32)
Chloride: 106 mEq/L (ref 96–112)
HCT: 39 % (ref 39.0–52.0)
Potassium: 4.8 mEq/L (ref 3.5–5.1)
Sodium: 140 mEq/L (ref 135–145)

## 2011-06-10 LAB — LIPASE, BLOOD: Lipase: 737 U/L — ABNORMAL HIGH (ref 11–59)

## 2011-06-10 LAB — CBC
HCT: 37 % — ABNORMAL LOW (ref 39.0–52.0)
MCH: 28.4 pg (ref 26.0–34.0)
MCV: 83.3 fL (ref 78.0–100.0)
RDW: 14.5 % (ref 11.5–15.5)
WBC: 6.3 10*3/uL (ref 4.0–10.5)

## 2011-06-10 LAB — GLUCOSE, CAPILLARY
Glucose-Capillary: 277 mg/dL — ABNORMAL HIGH (ref 70–99)
Glucose-Capillary: 197 mg/dL — ABNORMAL HIGH (ref 70–99)
Glucose-Capillary: 260 mg/dL — ABNORMAL HIGH (ref 70–99)
Glucose-Capillary: 282 mg/dL — ABNORMAL HIGH (ref 70–99)

## 2011-06-10 LAB — URINALYSIS, ROUTINE W REFLEX MICROSCOPIC
Bilirubin Urine: NEGATIVE
Hgb urine dipstick: NEGATIVE
Ketones, ur: NEGATIVE mg/dL
Protein, ur: 30 mg/dL — AB
Urobilinogen, UA: 0.2 mg/dL (ref 0.0–1.0)

## 2011-06-10 LAB — HEPATIC FUNCTION PANEL
ALT: 21 U/L (ref 0–53)
Alkaline Phosphatase: 101 U/L (ref 39–117)
Bilirubin, Direct: <0.1 mg/dL (ref 0.0–0.3)

## 2011-06-10 LAB — URINE MICROSCOPIC-ADD ON

## 2011-06-10 LAB — DIFFERENTIAL
Eosinophils Absolute: 0.2 10*3/uL (ref 0.0–0.7)
Eosinophils Relative: 3 % (ref 0–5)
Lymphocytes Relative: 36 % (ref 12–46)
Lymphs Abs: 2.3 10*3/uL (ref 0.7–4.0)
Monocytes Absolute: 0.6 10*3/uL (ref 0.1–1.0)

## 2011-06-10 MED ORDER — NIACIN-SIMVASTATIN ER 1000-20 MG PO TB24: 1.0000 | ORAL_TABLET | Freq: Every day | ORAL | Status: DC

## 2011-06-10 MED ORDER — NIACIN ER (ANTIHYPERLIPIDEMIC) 500 MG PO TBCR
500.0000 mg | EXTENDED_RELEASE_TABLET | Freq: Every morning | ORAL | Status: DC
  Administered 2011-06-10 – 2011-06-11 (×2): 500 mg via ORAL
  Filled 2011-06-10 (×2): qty 1

## 2011-06-10 MED ORDER — SODIUM CHLORIDE 0.9 % IV SOLN: INTRAVENOUS | Status: DC

## 2011-06-10 MED ORDER — SENNA 8.6 MG PO TABS
1.0000 | ORAL_TABLET | Freq: Two times a day (BID) | ORAL | Status: DC
  Administered 2011-06-10 – 2011-06-11 (×3): 8.6 mg via ORAL
  Filled 2011-06-10 (×2): qty 1
  Filled 2011-06-10: qty 2
  Filled 2011-06-10: qty 1

## 2011-06-10 MED ORDER — INSULIN ASPART 100 UNIT/ML ~~LOC~~ SOLN
0.0000 [IU] | SUBCUTANEOUS | Status: DC
  Administered 2011-06-10: 8 [IU] via SUBCUTANEOUS
  Administered 2011-06-11 (×2): 2 [IU] via SUBCUTANEOUS

## 2011-06-10 MED ORDER — HEPARIN SODIUM (PORCINE) 5000 UNIT/ML IJ SOLN
5000.0000 [IU] | Freq: Three times a day (TID) | INTRAMUSCULAR | Status: DC
  Administered 2011-06-10 – 2011-06-11 (×3): 5000 [IU] via SUBCUTANEOUS
  Filled 2011-06-10 (×7): qty 1

## 2011-06-10 MED ORDER — ONDANSETRON HCL 4 MG PO TABS: 4.0000 mg | ORAL_TABLET | Freq: Four times a day (QID) | ORAL | Status: DC | PRN

## 2011-06-10 MED ORDER — CLOPIDOGREL BISULFATE 75 MG PO TABS
75.0000 mg | ORAL_TABLET | Freq: Every day | ORAL | Status: DC
  Administered 2011-06-10 – 2011-06-11 (×2): 75 mg via ORAL
  Filled 2011-06-10 (×2): qty 1

## 2011-06-10 MED ORDER — ACETAMINOPHEN 650 MG RE SUPP: 650.0000 mg | Freq: Four times a day (QID) | RECTAL | Status: DC | PRN

## 2011-06-10 MED ORDER — INSULIN ASPART 100 UNIT/ML ~~LOC~~ SOLN
0.0000 [IU] | SUBCUTANEOUS | Status: DC
  Administered 2011-06-10: 3 [IU] via SUBCUTANEOUS
  Filled 2011-06-10: qty 3

## 2011-06-10 MED ORDER — LINAGLIPTIN 5 MG PO TABS
5.0000 mg | ORAL_TABLET | Freq: Every day | ORAL | Status: DC
  Administered 2011-06-10 – 2011-06-11 (×2): 5 mg via ORAL
  Filled 2011-06-10 (×2): qty 1

## 2011-06-10 MED ORDER — PREGABALIN 50 MG PO CAPS
50.0000 mg | ORAL_CAPSULE | Freq: Three times a day (TID) | ORAL | Status: DC
  Administered 2011-06-10 – 2011-06-11 (×3): 50 mg via ORAL
  Filled 2011-06-10 (×3): qty 1

## 2011-06-10 MED ORDER — INSULIN GLARGINE 100 UNIT/ML ~~LOC~~ SOLN
50.0000 [IU] | Freq: Every day | SUBCUTANEOUS | Status: DC
Start: 2011-06-10 — End: 2011-06-11
  Administered 2011-06-10: 50 [IU] via SUBCUTANEOUS
  Filled 2011-06-10: qty 3

## 2011-06-10 MED ORDER — DOCUSATE SODIUM 100 MG PO CAPS
100.0000 mg | ORAL_CAPSULE | Freq: Two times a day (BID) | ORAL | Status: DC
  Administered 2011-06-10 – 2011-06-11 (×3): 100 mg via ORAL
  Filled 2011-06-10 (×3): qty 1

## 2011-06-10 MED ORDER — METOPROLOL SUCCINATE ER 100 MG PO TB24
100.0000 mg | ORAL_TABLET | Freq: Every day | ORAL | Status: DC
  Administered 2011-06-10 – 2011-06-11 (×2): 100 mg via ORAL
  Filled 2011-06-10 (×2): qty 1

## 2011-06-10 MED ORDER — NIACIN ER 500 MG PO CPCR
1000.0000 mg | ORAL_CAPSULE | Freq: Every day | ORAL | Status: DC
  Administered 2011-06-10: 1000 mg via ORAL
  Filled 2011-06-10 (×2): qty 2

## 2011-06-10 MED ORDER — AMLODIPINE BESYLATE 10 MG PO TABS
10.0000 mg | ORAL_TABLET | Freq: Every day | ORAL | Status: DC
  Administered 2011-06-10 – 2011-06-11 (×2): 10 mg via ORAL
  Filled 2011-06-10 (×2): qty 1

## 2011-06-10 MED ORDER — ACETAMINOPHEN 325 MG PO TABS: 650.0000 mg | ORAL_TABLET | Freq: Four times a day (QID) | ORAL | Status: DC | PRN

## 2011-06-10 MED ORDER — ONDANSETRON HCL 4 MG/2ML IJ SOLN
4.0000 mg | Freq: Once | INTRAMUSCULAR | Status: AC
  Administered 2011-06-10: 4 mg via INTRAVENOUS
  Filled 2011-06-10: qty 2

## 2011-06-10 MED ORDER — ASPIRIN EC 325 MG PO TBEC: 325.0000 mg | DELAYED_RELEASE_TABLET | Freq: Every day | ORAL | Status: DC

## 2011-06-10 MED ORDER — SIMVASTATIN 20 MG PO TABS
20.0000 mg | ORAL_TABLET | Freq: Every day | ORAL | Status: DC
  Administered 2011-06-10: 20 mg via ORAL
  Filled 2011-06-10 (×2): qty 1

## 2011-06-10 MED ORDER — PNEUMOCOCCAL VAC POLYVALENT 25 MCG/0.5ML IJ INJ
0.5000 mL | INJECTION | Freq: Once | INTRAMUSCULAR | Status: AC
  Administered 2011-06-10: 0.5 mL via INTRAMUSCULAR
  Filled 2011-06-10: qty 0.5

## 2011-06-10 MED ORDER — SODIUM CHLORIDE 0.9 % IV SOLN
INTRAVENOUS | Status: DC
  Administered 2011-06-10: 03:00:00 via INTRAVENOUS

## 2011-06-10 MED ORDER — SODIUM CHLORIDE 0.9 % IV SOLN
INTRAVENOUS | Status: AC
  Administered 2011-06-10: 1000 mL via INTRAVENOUS
  Administered 2011-06-11: 75 mL/h via INTRAVENOUS

## 2011-06-10 MED ORDER — PREGABALIN 50 MG PO CAPS
50.0000 mg | ORAL_CAPSULE | Freq: Every morning | ORAL | Status: DC
  Administered 2011-06-10: 50 mg via ORAL
  Filled 2011-06-10: qty 1

## 2011-06-10 MED ORDER — ONDANSETRON HCL 4 MG/2ML IJ SOLN: 4.0000 mg | Freq: Four times a day (QID) | INTRAMUSCULAR | Status: DC | PRN

## 2011-06-10 MED ORDER — ASPIRIN EC 325 MG PO TBEC
325.0000 mg | DELAYED_RELEASE_TABLET | Freq: Every day | ORAL | Status: DC
  Administered 2011-06-10 – 2011-06-11 (×2): 325 mg via ORAL
  Filled 2011-06-10 (×2): qty 1

## 2011-06-10 MED ORDER — MORPHINE SULFATE 2 MG/ML IJ SOLN
2.0000 mg | INTRAMUSCULAR | Status: DC | PRN
  Administered 2011-06-10 – 2011-06-11 (×2): 2 mg via INTRAVENOUS
  Filled 2011-06-10 (×2): qty 1

## 2011-06-10 MED ORDER — INSULIN ASPART 100 UNIT/ML ~~LOC~~ SOLN
0.0000 [IU] | Freq: Three times a day (TID) | SUBCUTANEOUS | Status: DC
  Filled 2011-06-10: qty 3

## 2011-06-10 MED ORDER — HYDROMORPHONE HCL PF 1 MG/ML IJ SOLN
1.0000 mg | Freq: Once | INTRAMUSCULAR | Status: AC
  Administered 2011-06-10: 1 mg via INTRAVENOUS
  Filled 2011-06-10: qty 1

## 2011-06-10 NOTE — ED Notes (Signed)
Provided pt with urinal for sample ° °

## 2011-06-10 NOTE — ED Notes (Signed)
Pt has been having abd pain since last Wednesday. Pt unsure of cause. Pt thinks the upper abd is swollen. Pt has some nausea and vomiting. Pt had a small amount of BM today.

## 2011-06-10 NOTE — ED Notes (Signed)
Pt snoring in stretcher in NAD, respirations even and unlabored.

## 2011-06-10 NOTE — H&P (Signed)
PCP:   Philis Fendt, MD, MD   Confirmed with pt   Chief Complaint:  Abdominal pain  HPI: 45yoM with h/o CAD s/p stenting, PAD s/p left femoro-femoral bypass, diabetes on insulin presents  with pancreatitis.   Pt is reliable historian and relates that epigastric pain developed last Wednesday and has  progressive gotten worse, described as burning/cramping, radiating into his back, although pt has  baseline LBP/arthritis. He has also had nausea, and "made himself vomit" on Friday. He also  endorses increased abdominal distention and feeling of fullness, but has been having BM's, last  were yesterday in the am and in the pm that were hard/constipated, and assoc'd with flatus. He has  been able to maintain some PO intake that does not apparently worsen the pain.   In the ED, vitals were stable, but lipase noted to be >700, with normal LFT's. Cr a bit up at 1.5  and glucose elevated to 250. CBC stable. UA negative. Pt admitted for pancreatitis.   He adamantly denies any alcohol intake at all, although he does endorse cigarettes. He denies any  h/o chronic biliary colic type pain. He's never had pancreatitis before. He denies fevers, chills,  sweats, cardiopulmonary symptoms. ROS otherwise negative.  Past Medical History  Diagnosis Date  . Diabetes mellitus     On insulin   . Hypertension   . CHF (congestive heart failure)     Echo 07/2006 with normal LV fxn, and diastolic dysfxn  . MI (myocardial infarction)     S/p stenting per patient. Last stress 04/2007 negative   . Peripheral arterial disease     S/p left femoro-femoral bypass. LE cath by Dr. Einar Gip 09/2007 without significant stenosis  . Tobacco abuse   . Diverticulosis 05/2009    Noted on screening colo     Past Surgical History  Procedure Date  . Replacement total hip w/  resurfacing implants     Left  . Femoral-femoral bypass graft     In [place], on the left. Patent on cath 09/2007    Medications:  HOME MEDS:   Well reconciled with pt  Prior to Admission medications   Medication Sig Start Date End Date Taking? Authorizing Provider  amLODipine (NORVASC) 10 MG tablet Take 10 mg by mouth daily.   Yes Historical Provider, MD  aspirin 325 MG EC tablet Take 325 mg by mouth daily.   Yes Historical Provider, MD  clopidogrel (PLAVIX) 75 MG tablet Take 75 mg by mouth daily.   Yes Historical Provider, MD  cyclobenzaprine (FLEXERIL) 10 MG tablet Take 10 mg by mouth 3 (three) times daily as needed. For muscle spasms   Yes Historical Provider, MD  furosemide (LASIX) 40 MG tablet Take 40 mg by mouth daily.   Yes Historical Provider, MD  HYDROcodone-acetaminophen (VICODIN) 5-500 MG per tablet Take 1 tablet by mouth every 6 (six) hours as needed. For pain   Yes Historical Provider, MD  insulin glargine (LANTUS) 100 UNIT/ML injection Inject 50 Units into the skin daily after supper.    Yes Historical Provider, MD  insulin glulisine (APIDRA) 100 UNIT/ML injection Inject 10 Units into the skin daily before supper.    Yes Historical Provider, MD  Iron-Vitamins (GERITOL PO) Take 1 capsule by mouth every morning.   Yes Historical Provider, MD  methocarbamol (ROBAXIN) 500 MG tablet Take 500 mg by mouth daily as needed. For muscle relaxer   Yes Historical Provider, MD  metoprolol succinate (TOPROL-XL) 100 MG 24 hr tablet  Take 100 mg by mouth daily. Take with or immediately following a meal.   Yes Historical Provider, MD  niacin (NIASPAN) 500 MG CR tablet Take 500 mg by mouth every morning.   Yes Historical Provider, MD  niacin-simvastatin [facility]) 1000-20 MG 24 hr tablet Take 1 tablet by mouth at bedtime.   Yes Historical Provider, MD  OMEGA-3 KRILL OIL PO Take 1 capsule by mouth every morning.   Yes Historical Provider, MD  pantoprazole (PROTONIX) 40 MG tablet Take 40 mg by mouth daily as needed. For acid reflux   Yes Historical Provider, MD  pregabalin (LYRICA) 50 MG capsule Take 50 mg by mouth every morning.   Yes Historical  Provider, MD  quinapril (ACCUPRIL) 40 MG tablet Take 40 mg by mouth at bedtime.   Yes Historical Provider, MD  sitaGLIPtin (JANUVIA) 100 MG tablet Take 100 mg by mouth daily.   Yes Historical Provider, MD   Allergies:  No Known Allergies  Social History:   reports that he has been smoking Cigarettes.  He has a 25 pack-year smoking history. He has never used smokeless tobacco. He reports that he does not drink alcohol or use illicit drugs.  Lives with wife, has grown children. Still active without cane or walker. Still smoking 1/2 ppd but denies any alcohol or drugs.   Family History: History reviewed. No pertinent family history.  Physical Exam: Filed Vitals:   06/10/11 0234 06/10/11 0353 06/10/11 0430 06/10/11 0500  BP: 123/59 122/67 118/70 129/73  Pulse: 64 70 72 67  Temp:      TempSrc:      Resp: 18 18    SpO2: 96% 99% 95% 99%   Blood pressure 129/73, pulse 67, temperature 98.5 F (36.9 C), temperature source Oral, resp. rate 18, SpO2 99.00%.  Gen: Younger than stated age appearing M in no distress, pleasant, able to relate history well.  Breathing comfortably, not in pain.  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 Pupils round and reactive, scleral muddying present. Mouth moist, with dentures in place,  normal Lungs: CTAB no w/c/r, normal air movement Heart: Regular and not tachycardic, no m/g, normal exam Abd: Distended moderately, but not peritoneal or rigid, minimal facial grimacing to palpation, no  bowel sounds heard. Negative Murphy sign.  Extrem: Warm, perfusing well, radials easily palpable, no BLE edema noted.  Neuro: Alert, attentive, oriented, pleasant, CN 2-12 intact without droop or slurring, moves  extremities and sits up in bed on his own. Grossly non-focal.    Labs & Imaging Results for orders placed during the hospital encounter of 06/09/11 (from the past 48 hour(s))  CBC     Status: Abnormal     Collection Time   06/10/11  2:29 AM      Component Value Range Comment   WBC 6.3  4.0 - 10.5 (K/uL)    RBC 4.44  4.22 - 5.81 (MIL/uL)    Hemoglobin 12.6 (*) 13.0 - 17.0 (g/dL)    HCT 37.0 (*) 39.0 - 52.0 (%)    MCV 83.3  78.0 - 100.0 (fL)    MCH 28.4  26.0 - 34.0 (pg)    MCHC 34.1  30.0 - 36.0 (g/dL)    RDW 14.5  11.5 - 15.5 (%)    Platelets 221  150 - 400 (K/uL)   DIFFERENTIAL     Status: Normal   Collection Time   06/10/11  2:29 AM  Component Value Range Comment   Neutrophils Relative 52  43 - 77 (%)    Neutro Abs 3.3  1.7 - 7.7 (K/uL)    Lymphocytes Relative 36  12 - 46 (%)    Lymphs Abs 2.3  0.7 - 4.0 (K/uL)    Monocytes Relative 9  3 - 12 (%)    Monocytes Absolute 0.6  0.1 - 1.0 (K/uL)    Eosinophils Relative 3  0 - 5 (%)    Eosinophils Absolute 0.2  0.0 - 0.7 (K/uL)    Basophils Relative 1  0 - 1 (%)    Basophils Absolute 0.0  0.0 - 0.1 (K/uL)   HEPATIC FUNCTION PANEL     Status: Normal   Collection Time   06/10/11  2:29 AM      Component Value Range Comment   Total Protein 7.2  6.0 - 8.3 (g/dL)    Albumin 3.5  3.5 - 5.2 (g/dL)    AST 19  0 - 37 (U/L)    ALT 21  0 - 53 (U/L)    Alkaline Phosphatase 101  39 - 117 (U/L)    Total Bilirubin 0.4  0.3 - 1.2 (mg/dL)    Bilirubin, Direct <0.1  0.0 - 0.3 (mg/dL)    Indirect Bilirubin NOT CALCULATED  0.3 - 0.9 (mg/dL)   LIPASE, BLOOD     Status: Abnormal   Collection Time   06/10/11  2:29 AM      Component Value Range Comment   Lipase 737 (*) 11 - 59 (U/L) REPEATED TO VERIFY  POCT I-STAT, CHEM 8     Status: Abnormal   Collection Time   06/10/11  2:51 AM      Component Value Range Comment   Sodium 140  135 - 145 (mEq/L)    Potassium 4.8  3.5 - 5.1 (mEq/L)    Chloride 106  96 - 112 (mEq/L)    BUN 22  6 - 23 (mg/dL)    Creatinine, Ser 1.50 (*) 0.50 - 1.35 (mg/dL)    Glucose, Bld 256 (*) 70 - 99 (mg/dL)    Calcium, Ion 1.27  1.12 - 1.32 (mmol/L)    TCO2 27  0 - 100 (mmol/L)    Hemoglobin 13.3  13.0 - 17.0 (g/dL)    HCT  39.0  39.0 - 52.0 (%)   URINALYSIS, ROUTINE W REFLEX MICROSCOPIC     Status: Abnormal   Collection Time   06/10/11  3:29 AM      Component Value Range Comment   Color, Urine YELLOW  YELLOW     APPearance CLEAR  CLEAR     Specific Gravity, Urine 1.017  1.005 - 1.030     pH 5.5  5.0 - 8.0     Glucose, UA 500 (*) NEGATIVE (mg/dL)    Hgb urine dipstick NEGATIVE  NEGATIVE     Bilirubin Urine NEGATIVE  NEGATIVE     Ketones, ur NEGATIVE  NEGATIVE (mg/dL)    Protein, ur 30 (*) NEGATIVE (mg/dL)    Urobilinogen, UA 0.2  0.0 - 1.0 (mg/dL)    Nitrite NEGATIVE  NEGATIVE     Leukocytes, UA NEGATIVE  NEGATIVE    URINE MICROSCOPIC-ADD ON     Status: Normal   Collection Time   06/10/11  3:29 AM      Component Value Range Comment   WBC, UA 0-2  <3 (WBC/hpf)    RBC / HPF 0-2  <3 (RBC/hpf)    No results found.  Impression Present on Admission:  .Pancreatitis, acute .Renal insufficiency .Diabetes mellitus .Hypertension  24yoM with h/o CAD s/p stenting, PAD s/p left femoro-femoral bypass, diabetes on insulin presents  with pancreatitis.   1. Pancreatitis: Pt seems reliable and adamantly refuses any alcohol intake, he also denies any  h/o pancreatitis, biliary colic, or gallstone disease. However, given negative alcohol intake,  gallstones is probably the most likely Dx.   - Symptomatic therapy, keep NPO and give IVF's, can advance to clears as tolerated, get RUQ u/s  and depending on results, may need GI consult, ERCP, etc.  - Plain film abdomen for abdominal distention and constipation to r/o obstruction.   2. Acute vs chronic kidney disease: Cr 1.5 is up from prior noted values of 1.2-1.4 which is  probably his baseline. Will give IVF's as above and trend for now.  - Holding home lasix, quinapril  3. Diabetes: On insulin and currently hyperglycemic. Will continue home lantus, add moderate SSI.  Continue home sitagliptin alternative med  4. HTN: Continue home amlodipine, ASA 325, niacin,  statin  5. H/o MI: Continue plavix, toprol  Regular bed, MC team 4 Presumed full code   Other plans as per orders.  Halie Gass 06/10/2011, 5:49 AM

## 2011-06-10 NOTE — ED Provider Notes (Signed)
History     CSN: [id number]  Arrival date & time 06/09/11  2345   First MD Initiated Contact with Patient 06/10/11 0217      Chief Complaint  Patient presents with  . Abdominal Pain    (Consider location/radiation/quality/duration/timing/severity/associated sxs/prior treatment) HPI This is a 71 year old black male with a six-day history of abdominal pain. The pain began in the epigastrium and is now located in the epigastrium and suprapubic regions. He states it feels like someone is stabbing him. The pain is moderate to severe. It has gotten worse. He feels that his abdomen is mildly distended. He has been nauseated but has not vomited. His appetite is still present but is unable to eat orally but likely due to nausea. He has not had diarrhea but has had decreased bowel movements. He denies dysuria or hematuria. He denies fevers or chills.   Past Medical History  Diagnosis Date  . Diabetes mellitus   . Hypertension   . CHF (congestive heart failure)   . MI (myocardial infarction)     Past Surgical History  Procedure Date  . Replacement total hip w/  resurfacing implants     History reviewed. No pertinent family history.  History  Substance Use Topics  . Smoking status: Not on file  . Smokeless tobacco: Not on file  . Alcohol Use: No      Review of Systems  All other systems reviewed and are negative.    Allergies  Review of patient's allergies indicates no known allergies.  Home Medications   Current Outpatient Rx  Name Route Sig Dispense Refill  . AMLODIPINE BESYLATE 10 MG PO TABS Oral Take 10 mg by mouth daily.    . ASPIRIN 325 MG PO TBEC Oral Take 325 mg by mouth daily.    Marland Kitchen CLOPIDOGREL BISULFATE 75 MG PO TABS Oral Take 75 mg by mouth daily.    . CYCLOBENZAPRINE HCL 10 MG PO TABS Oral Take 10 mg by mouth 3 (three) times daily as needed. For muscle spasms    . FUROSEMIDE 40 MG PO TABS Oral Take 40 mg by mouth daily.    Marland Kitchen HYDROCODONE-ACETAMINOPHEN 5-500  MG PO TABS Oral Take 1 tablet by mouth every 6 (six) hours as needed. For pain    . INSULIN GLARGINE 100 UNIT/ML [place] SOLN Subcutaneous Inject 50 Units into the skin daily.    . INSULIN GLULISINE 100 UNIT/ML [place] SOLN Subcutaneous Inject 10 Units into the skin 3 (three) times daily before meals.    Marland Kitchen GERITOL PO Oral Take 1 capsule by mouth every morning.    Marland Kitchen METHOCARBAMOL 500 MG PO TABS Oral Take 500 mg by mouth daily as needed. For muscle relaxer    . METOPROLOL SUCCINATE ER 100 MG PO TB24 Oral Take 100 mg by mouth daily. Take with or immediately following a meal.    . NIACIN ER (ANTIHYPERLIPIDEMIC) 500 MG PO TBCR Oral Take 500 mg by mouth every morning.    Marland Kitchen NIACIN-SIMVASTATIN ER 1000-20 MG PO TB24 Oral Take 1 tablet by mouth at bedtime.    . OMEGA-3 KRILL OIL PO Oral Take 1 capsule by mouth every morning.    Marland Kitchen PANTOPRAZOLE SODIUM 40 MG PO TBEC Oral Take 40 mg by mouth daily as needed. For acid reflux    . PREGABALIN 50 MG PO CAPS Oral Take 50 mg by mouth every morning.    . QUINAPRIL HCL 40 MG PO TABS Oral Take 40 mg by mouth at bedtime.    Marland Kitchen  SITAGLIPTIN PHOSPHATE 100 MG PO TABS Oral Take 100 mg by mouth daily.      BP 137/64  Pulse 66  Temp(Src) 98.5 F (36.9 C) (Oral)  Resp 20  SpO2 96%  Physical Exam General: Well-developed, well-nourished male in no acute distress; appearance consistent with age of record HENT: normocephalic, atraumatic Eyes: pupils equal round and reactive to light; extraocular muscles intact; arcus senilis bilateral Neck: supple Heart: regular rate and rhythm; no murmurs Lungs: clear to auscultation bilaterally Abdomen: soft; nondistended; mild epigastric and suprapubic tenderness; no masses or hepatosplenomegaly appreciated; bowel sounds present Extremities: No deformity; full range of motion; pulses normal Neurologic: Awake, alert and oriented; motor function intact in all extremities and symmetric; no facial droop Skin: Warm and dry Psychiatric: Normal mood  and affect    ED Course  Procedures (including critical care time)    MDM   Nursing notes and vitals signs, including pulse oximetry, reviewed.  Summary of this visit's results, reviewed by myself:  Labs:  Results for orders placed during the hospital encounter of 06/09/11  CBC      Component Value Range   WBC 6.3  4.0 - 10.5 (K/uL)   RBC 4.44  4.22 - 5.81 (MIL/uL)   Hemoglobin 12.6 (*) 13.0 - 17.0 (g/dL)   HCT 37.0 (*) 39.0 - 52.0 (%)   MCV 83.3  78.0 - 100.0 (fL)   MCH 28.4  26.0 - 34.0 (pg)   MCHC 34.1  30.0 - 36.0 (g/dL)   RDW 14.5  11.5 - 15.5 (%)   Platelets 221  150 - 400 (K/uL)  DIFFERENTIAL      Component Value Range   Neutrophils Relative 52  43 - 77 (%)   Neutro Abs 3.3  1.7 - 7.7 (K/uL)   Lymphocytes Relative 36  12 - 46 (%)   Lymphs Abs 2.3  0.7 - 4.0 (K/uL)   Monocytes Relative 9  3 - 12 (%)   Monocytes Absolute 0.6  0.1 - 1.0 (K/uL)   Eosinophils Relative 3  0 - 5 (%)   Eosinophils Absolute 0.2  0.0 - 0.7 (K/uL)   Basophils Relative 1  0 - 1 (%)   Basophils Absolute 0.0  0.0 - 0.1 (K/uL)  HEPATIC FUNCTION PANEL      Component Value Range   Total Protein 7.2  6.0 - 8.3 (g/dL)   Albumin 3.5  3.5 - 5.2 (g/dL)   AST 19  0 - 37 (U/L)   ALT 21  0 - 53 (U/L)   Alkaline Phosphatase 101  39 - 117 (U/L)   Total Bilirubin 0.4  0.3 - 1.2 (mg/dL)   Bilirubin, Direct <0.1  0.0 - 0.3 (mg/dL)   Indirect Bilirubin NOT CALCULATED  0.3 - 0.9 (mg/dL)  LIPASE, BLOOD      Component Value Range   Lipase 737 (*) 11 - 59 (U/L)  URINALYSIS, ROUTINE W REFLEX MICROSCOPIC      Component Value Range   Color, Urine YELLOW  YELLOW    APPearance CLEAR  CLEAR    Specific Gravity, Urine 1.017  1.005 - 1.030    pH 5.5  5.0 - 8.0    Glucose, UA 500 (*) NEGATIVE (mg/dL)   Hgb urine dipstick NEGATIVE  NEGATIVE    Bilirubin Urine NEGATIVE  NEGATIVE    Ketones, ur NEGATIVE  NEGATIVE (mg/dL)   Protein, ur 30 (*) NEGATIVE (mg/dL)   Urobilinogen, UA 0.2  0.0 - 1.0 (mg/dL)    Nitrite NEGATIVE  NEGATIVE    Leukocytes, UA NEGATIVE  NEGATIVE   POCT I-STAT, CHEM 8      Component Value Range   Sodium 140  135 - 145 (mEq/L)   Potassium 4.8  3.5 - 5.1 (mEq/L)   Chloride 106  96 - 112 (mEq/L)   BUN 22  6 - 23 (mg/dL)   Creatinine, Ser 1.50 (*) 0.50 - 1.35 (mg/dL)   Glucose, Bld 256 (*) 70 - 99 (mg/dL)   Calcium, Ion 1.27  1.12 - 1.32 (mmol/L)   TCO2 27  0 - 100 (mmol/L)   Hemoglobin 13.3  13.0 - 17.0 (g/dL)   HCT 39.0  39.0 - 52.0 (%)  URINE MICROSCOPIC-ADD ON      Component Value Range   WBC, UA 0-2  <3 (WBC/hpf)   RBC / HPF 0-2  <3 (RBC/hpf)    [facility] to admit for new onset pancreatitis. Patient states he is a nondrinker.        Wynetta Fines, MD 06/10/11 [phone]

## 2011-06-10 NOTE — ED Notes (Signed)
Pt resting now and introduced self.  Pt has abdominal tenderness to mid above umbilicus area.  Bowel sounds present.  Pt states he had bowel movement yesterday and it was hard.  Pt has no swelling to ankles or sob.  Warm blankets given and will continue to monitor

## 2011-06-10 NOTE — Progress Notes (Signed)
Pt with H/O CAD-Stent, admitted few hrs ago for 2nd episode of Acute Pancreatitis, awaits [place] Abd, pain better, start clears, accuchecks and scale Q4hrs.

## 2011-06-10 NOTE — ED Notes (Signed)
CBG 227 

## 2011-06-10 NOTE — Progress Notes (Signed)
Utilization review completed. Zachary Hoffman 06/10/2011

## 2011-06-10 NOTE — ED Notes (Signed)
Called to give report and floor unable to take at this time.  Floor to call me back

## 2011-06-11 ENCOUNTER — Encounter: Payer: Self-pay | Admitting: Gastroenterology

## 2011-06-11 LAB — COMPREHENSIVE METABOLIC PANEL
ALT: 17 U/L (ref 0–53)
AST: 17 U/L (ref 0–37)
CO2: 23 mEq/L (ref 19–32)
Calcium: 9.6 mg/dL (ref 8.4–10.5)
Chloride: 108 mEq/L (ref 96–112)
Creatinine, Ser: 1.2 mg/dL (ref 0.50–1.35)
GFR calc Af Amer: 69 mL/min — ABNORMAL LOW (ref >90)
GFR calc non Af Amer: 60 mL/min — ABNORMAL LOW (ref >90)
Glucose, Bld: 66 mg/dL — ABNORMAL LOW (ref 70–99)
Total Bilirubin: 0.4 mg/dL (ref 0.3–1.2)

## 2011-06-11 LAB — CBC
Hemoglobin: 12.1 g/dL — ABNORMAL LOW (ref 13.0–17.0)
MCH: 28.3 pg (ref 26.0–34.0)
MCHC: 34.2 g/dL (ref 30.0–36.0)
Platelets: 206 10*3/uL (ref 150–400)
RBC: 4.28 MIL/uL (ref 4.22–5.81)

## 2011-06-11 LAB — LIPASE, BLOOD: Lipase: 108 U/L — ABNORMAL HIGH (ref 11–59)

## 2011-06-11 LAB — GLUCOSE, CAPILLARY: Glucose-Capillary: 131 mg/dL — ABNORMAL HIGH (ref 70–99)

## 2011-06-11 MED ORDER — HYDROCODONE-ACETAMINOPHEN 5-500 MG PO TABS: 1.0000 | ORAL_TABLET | Freq: Four times a day (QID) | ORAL | Status: DC | PRN

## 2011-06-11 NOTE — Discharge Summary (Signed)
Patient ID: Zachary Hoffman MRN: [id number] DOB/AGE: 1941/01/16 71 y.o.  Admit date: 06/09/2011 Discharge date: 06/11/2011  Primary Care Physician:  Philis Fendt, MD, MD  Discharge Diagnoses:    Present on Admission:  .Pancreatitis, acute .Renal insufficiency .Diabetes mellitus .Hypertension  Active Problems:  Diabetes mellitus  Hypertension  Pancreatitis, acute  Renal insufficiency   Medication List  As of 06/11/2011 10:51 AM   TAKE these medications         amLODipine 10 MG tablet   Commonly known as: NORVASC   Take 10 mg by mouth daily.      aspirin 325 MG EC tablet   Take 325 mg by mouth daily.      clopidogrel 75 MG tablet   Commonly known as: PLAVIX   Take 75 mg by mouth daily.      cyclobenzaprine 10 MG tablet   Commonly known as: FLEXERIL   Take 10 mg by mouth 3 (three) times daily as needed. For muscle spasms      furosemide 40 MG tablet   Commonly known as: LASIX   Take 40 mg by mouth daily.      GERITOL PO   Take 1 capsule by mouth every morning.      HYDROcodone-acetaminophen 5-500 MG per tablet   Commonly known as: VICODIN   Take 1 tablet by mouth every 6 (six) hours as needed. For pain      insulin glargine 100 UNIT/ML injection   Commonly known as: LANTUS   Inject 50 Units into the skin daily after supper.      insulin glulisine 100 UNIT/ML injection   Commonly known as: APIDRA   Inject 10 Units into the skin daily before supper.      methocarbamol 500 MG tablet   Commonly known as: ROBAXIN   Take 500 mg by mouth daily as needed. For muscle relaxer      metoprolol succinate 100 MG 24 hr tablet   Commonly known as: TOPROL-XL   Take 100 mg by mouth daily. Take with or immediately following a meal.      niacin 500 MG CR tablet   Commonly known as: NIASPAN   Take 500 mg by mouth every morning.      niacin-simvastatin 1000-20 MG 24 hr tablet   Commonly known as: SIMCOR   Take 1 tablet by mouth at bedtime.      OMEGA-3 KRILL OIL PO   Take 1 capsule by mouth every morning.      pantoprazole 40 MG tablet   Commonly known as: PROTONIX   Take 40 mg by mouth daily as needed. For acid reflux      pregabalin 50 MG capsule   Commonly known as: LYRICA   Take 50 mg by mouth every morning.      quinapril 40 MG tablet   Commonly known as: ACCUPRIL   Take 40 mg by mouth at bedtime.      sitaGLIPtin 100 MG tablet   Commonly known as: JANUVIA   Take 100 mg by mouth daily.            Disposition and Follow-up: Patient will need to see PCP in 2 weeks and have following lab tests repeated: complete metabolic panel to ensure that liver enzymes remain stable, also needs to have lipase checked to ensure it is trending down. I have also scheduled an appointment with [facility] Dr. Sharlett Iles 06/24/2011 at 2:45 pm for further recommendations on management and evaluation. Will forward this  note to Dr. Sharlett Iles and PCP as well.  Consults: none  Significant Diagnostic Studies:  Dg Abd 1 View 06/10/2011   IMPRESSION: Nonobstructive bowel gas pattern.    US Abdomen Complete 06/10/2011   IMPRESSION: Small amount of sludge within the gallbladder.  No stones or evidence of acute cholecystitis.  Pancreas not visualized due to overlying bowel gas and body habitus.    Brief H and P: 71 yo M with h/o CAD s/p stenting, PAD s/p left femoro-femoral bypass, diabetes on insulin, presents with left sided, LUQ abdominal pain that started 1 week prior to admission and has been getting progressively worse, associated with poor oral intake, nausea and vomiting, radiating to the back, and 7/10 in severity. His pain was worse with food ingestion and relieved with rest. He denies similar episodes in the past but was admitted in the past for pancreatitis. He denies alcohol intake but does endorse smoking. He denies fevers, chills, sweats, cardiopulmonary symptoms. ROS otherwise negative.  Physical Exam on Discharge:  Filed Vitals:   06/10/11 1407 06/10/11  2100 06/11/11 0606 06/11/11 1011  BP: 126/66 135/71 118/63 146/66  Pulse: 64 56 67   Temp: 97 F (36.1 C) 97.5 F (36.4 C) 99.4 F (37.4 C)   TempSrc: Oral Oral Oral   Resp: 16 20 20    Height:      Weight:      SpO2: 97% 98% 95%     Intake/Output Summary (Last 24 hours) at 06/11/11 1051 Last data filed at 06/10/11 2341  Gross per 24 hour  Intake    360 ml  Output      0 ml  Net    360 ml    General: Alert, awake, oriented x3, in no acute distress. HEENT: No bruits, no goiter. Heart: Regular rate and rhythm, without murmurs, rubs, gallops. Lungs: Clear to auscultation bilaterally. Abdomen: Soft, nontender, nondistended, positive bowel sounds. Extremities: No clubbing cyanosis or edema with positive pedal pulses. Neuro: Grossly intact, nonfocal.  CBC:    Component Value Date/Time   WBC 5.5 06/11/2011 0613   HGB 12.1* 06/11/2011 0613   HCT 35.4* 06/11/2011 0613   PLT 206 06/11/2011 0613   MCV 82.7 06/11/2011 0613   NEUTROABS 3.3 06/10/2011 0229   LYMPHSABS 2.3 06/10/2011 0229   MONOABS 0.6 06/10/2011 0229   EOSABS 0.2 06/10/2011 0229   BASOSABS 0.0 06/10/2011 99991111    Basic Metabolic Panel:    Component Value Date/Time   NA 140 06/11/2011 0613   K 4.3 06/11/2011 0613   CL 108 06/11/2011 0613   CO2 23 06/11/2011 0613   BUN 12 06/11/2011 0613   CREATININE 1.20 06/11/2011 0613   GLUCOSE 66* 06/11/2011 0613   CALCIUM 9.6 06/11/2011 0613    Hospital Course:   Active Problems:  Diabetes mellitus - this has remained stable during the stay - pt will resume home medication regimen upon discharge   Hypertension - remained stable during the hospitalization - pt will resume home medication regimen as noted above   Pancreatitis, acute - please note the elevated lipase level of >700 - repeat lipase pending - pt clinically improved and denies any pain - pt also tolerating solids and liquids with no nausea or vomiting  - his LFT are stable and within normal limits - please also note that small  amount of biliary sludge noted on imaging and I have discussed with pt the need for CT of the abdomen for further evaluation - he is insisting on going  home and having the rest of the work up done in an outpatient setting - he has responded well to IVF, analgesia and antiemetics - I have scheduled an appointment with GI and please see disposition part   Renal insufficiency - stable function  And within normal limits  Time spent on Discharge: Over 30 minutes   DISPOSITION - plan of care and diagnosis, diagnostic studies and test results were discussed with pt  - pt verbalized understanding  Signed: MAGICK-Pooja Camuso 06/11/2011, 10:51 AM  [facility] [phone] (pager)

## 2011-06-24 ENCOUNTER — Ambulatory Visit: Payer: Medicare Other | Admitting: Gastroenterology

## 2011-10-21 ENCOUNTER — Ambulatory Visit: Payer: Medicare Other | Attending: Internal Medicine | Admitting: Physical Therapy

## 2011-10-21 DIAGNOSIS — IMO0001 Reserved for inherently not codable concepts without codable children: Secondary | ICD-10-CM | POA: Insufficient documentation

## 2011-10-21 DIAGNOSIS — M545 Low back pain, unspecified: Secondary | ICD-10-CM | POA: Insufficient documentation

## 2011-10-21 DIAGNOSIS — R293 Abnormal posture: Secondary | ICD-10-CM | POA: Insufficient documentation

## 2011-10-28 ENCOUNTER — Ambulatory Visit: Payer: Medicare Other | Admitting: Rehabilitation

## 2011-10-30 ENCOUNTER — Ambulatory Visit: Payer: Medicare Other | Admitting: Rehabilitation

## 2011-11-03 ENCOUNTER — Encounter: Payer: Medicare Other | Admitting: Rehabilitation

## 2011-11-05 ENCOUNTER — Encounter: Payer: Medicare Other | Admitting: Rehabilitation

## 2011-11-14 ENCOUNTER — Ambulatory Visit: Payer: Medicare Other | Attending: Internal Medicine | Admitting: Rehabilitation

## 2011-11-14 DIAGNOSIS — R293 Abnormal posture: Secondary | ICD-10-CM | POA: Insufficient documentation

## 2011-11-14 DIAGNOSIS — IMO0001 Reserved for inherently not codable concepts without codable children: Secondary | ICD-10-CM | POA: Insufficient documentation

## 2011-11-14 DIAGNOSIS — M545 Low back pain, unspecified: Secondary | ICD-10-CM | POA: Insufficient documentation

## 2011-11-17 ENCOUNTER — Ambulatory Visit: Payer: Medicare Other | Admitting: Physical Therapy

## 2011-11-19 ENCOUNTER — Encounter: Payer: Medicare Other | Admitting: Physical Therapy

## 2011-11-24 ENCOUNTER — Encounter: Payer: Medicare Other | Admitting: Physical Therapy

## 2012-05-24 ENCOUNTER — Ambulatory Visit (HOSPITAL_COMMUNITY)
Admission: RE | Admit: 2012-05-24 | Discharge: 2012-05-24 | Disposition: A | Discharge status: Home / Self Care | Payer: Medicare Other | Source: Ambulatory Visit | Attending: Internal Medicine | Admitting: Internal Medicine

## 2012-05-24 ENCOUNTER — Other Ambulatory Visit (HOSPITAL_COMMUNITY): Payer: Self-pay | Admitting: Internal Medicine

## 2012-05-24 DIAGNOSIS — R071 Chest pain on breathing: Secondary | ICD-10-CM

## 2012-05-24 DIAGNOSIS — R079 Chest pain, unspecified: Secondary | ICD-10-CM | POA: Insufficient documentation

## 2012-06-23 ENCOUNTER — Other Ambulatory Visit: Payer: Self-pay | Admitting: Gastroenterology

## 2012-06-23 DIAGNOSIS — R109 Unspecified abdominal pain: Secondary | ICD-10-CM

## 2012-06-25 ENCOUNTER — Other Ambulatory Visit: Payer: Medicare Other

## 2012-07-01 ENCOUNTER — Ambulatory Visit
Admission: RE | Admit: 2012-07-01 | Discharge: 2012-07-01 | Disposition: A | Discharge status: Home / Self Care | Payer: Medicare Other | Source: Ambulatory Visit | Attending: Gastroenterology | Admitting: Gastroenterology

## 2012-07-01 DIAGNOSIS — R109 Unspecified abdominal pain: Secondary | ICD-10-CM

## 2012-08-12 ENCOUNTER — Other Ambulatory Visit (HOSPITAL_COMMUNITY): Payer: Self-pay | Admitting: Cardiovascular Disease

## 2012-08-12 DIAGNOSIS — I739 Peripheral vascular disease, unspecified: Secondary | ICD-10-CM

## 2012-08-15 ENCOUNTER — Encounter: Payer: Self-pay | Admitting: *Deleted

## 2012-08-24 ENCOUNTER — Encounter (HOSPITAL_COMMUNITY): Payer: Medicare Other

## 2012-09-24 ENCOUNTER — Ambulatory Visit (HOSPITAL_COMMUNITY)
Admission: RE | Admit: 2012-09-24 | Discharge: 2012-09-24 | Disposition: A | Discharge status: Home / Self Care | Payer: Medicare Other | Source: Ambulatory Visit | Attending: Internal Medicine | Admitting: Internal Medicine

## 2012-09-24 ENCOUNTER — Encounter: Payer: Self-pay | Admitting: Cardiovascular Disease

## 2012-09-24 DIAGNOSIS — I739 Peripheral vascular disease, unspecified: Secondary | ICD-10-CM

## 2012-09-24 NOTE — Progress Notes (Signed)
Lower Extremity Arterial Duplex Completed. °Zachary Hoffman ° °

## 2012-10-19 ENCOUNTER — Telehealth: Payer: Self-pay | Admitting: *Deleted

## 2012-10-19 DIAGNOSIS — I739 Peripheral vascular disease, unspecified: Secondary | ICD-10-CM

## 2012-10-19 NOTE — Telephone Encounter (Signed)
Message copied by Tressa Busman on Tue Oct 19, 2012  4:27 PM ------      Message from: [place], [place]      Created: Sun Oct 17, 2012  7:00 PM       Arterial supply to left foot is a little worse compared to last eval, due to known infrapopliteal disease; repeat in 6 months. ------

## 2012-10-19 NOTE — Telephone Encounter (Signed)
Results of doppler study called to patient.  Informed him we need to repeat in 6 months.  Voiced understanding.

## 2012-10-19 NOTE — Telephone Encounter (Signed)
Arterial doppler order placed for 04/2013

## 2012-10-22 ENCOUNTER — Telehealth: Payer: Self-pay | Admitting: Cardiovascular Disease

## 2012-10-22 NOTE — Telephone Encounter (Signed)
Wants Dr. Loletha Grayer to be aware of legs swelling.  Told pt he needs to make an appt for evaluation and treatment we can not treat over the phone.  Voiced understanding will call Monday to schedule.  Suggested he see a PA for edema and then schedule w/either Dr. Loletha Grayer or Dr. Gwenlyn Found.

## 2012-10-22 NOTE — Telephone Encounter (Signed)
Pt received a letter about his leg and would like to speak to someone regarding this letter.

## 2012-10-22 NOTE — Telephone Encounter (Signed)
Pt wants to now if he can get a new medical records release sent to him, he would like to update it but would not like to wait until his next appointment. He would like for someone to call him back.

## 2012-10-25 ENCOUNTER — Ambulatory Visit (INDEPENDENT_AMBULATORY_CARE_PROVIDER_SITE_OTHER): Payer: Medicare Other | Admitting: Cardiology

## 2012-10-25 ENCOUNTER — Encounter: Payer: Self-pay | Admitting: Cardiology

## 2012-10-25 ENCOUNTER — Telehealth: Payer: Self-pay | Admitting: *Deleted

## 2012-10-25 VITALS — BP 140/66 | HR 60 | Ht 67.0 in | Wt 205.0 lb

## 2012-10-25 DIAGNOSIS — I739 Peripheral vascular disease, unspecified: Secondary | ICD-10-CM

## 2012-10-25 DIAGNOSIS — Z733 Stress, not elsewhere classified: Secondary | ICD-10-CM

## 2012-10-25 DIAGNOSIS — G4730 Sleep apnea, unspecified: Secondary | ICD-10-CM | POA: Insufficient documentation

## 2012-10-25 DIAGNOSIS — R609 Edema, unspecified: Secondary | ICD-10-CM

## 2012-10-25 DIAGNOSIS — I2510 Atherosclerotic heart disease of native coronary artery without angina pectoris: Secondary | ICD-10-CM | POA: Insufficient documentation

## 2012-10-25 DIAGNOSIS — F439 Reaction to severe stress, unspecified: Secondary | ICD-10-CM

## 2012-10-25 DIAGNOSIS — E669 Obesity, unspecified: Secondary | ICD-10-CM

## 2012-10-25 NOTE — Assessment & Plan Note (Signed)
No angina 

## 2012-10-25 NOTE — Telephone Encounter (Signed)
Pt called c/o left leg edema and pain.  Wants to be seen ASAP.   Appt scheduled w/Luke Dyann Kief today at 3:40pm.

## 2012-10-25 NOTE — Assessment & Plan Note (Signed)
I suspect this is venous insufficiency but it is unilateral and recent in onset, r/o DVT

## 2012-10-25 NOTE — Assessment & Plan Note (Signed)
Recent drop in his Lt ABI, no claudication history

## 2012-10-25 NOTE — Patient Instructions (Addendum)
Avoid salt, continue with compression stockings. Sleep study, dopplers, follow up with Dr Sallyanne Kuster.  Your physician has recommended that you have a sleep study. This test records several body functions during sleep, including: brain activity, eye movement, oxygen and carbon dioxide blood levels, heart rate and rhythm, breathing rate and rhythm, the flow of air through your mouth and nose, snoring, body muscle movements, and chest and belly movement.  Your physician has requested that you have a lower extremity venous duplex. This test is an ultrasound of the veins in the legs. It looks at venous blood flow that carries blood from the heart to the legs. Allow one hour for a Lower Venous exam. There are no restrictions or special instructions. Please schedule this week.    Marland Kitchen

## 2012-10-25 NOTE — Progress Notes (Signed)
10/25/2012 Zachary Hoffman   [birth year]  [id number]  Primary Physicia Zachary Fendt, MD Primary Cardiologist: Dr Zachary Hoffman  HPI:   [name] is a pleasant 72 y/o from [place] who had a Lt Fem-Fem BPG and an LAD stent (no details available) in 2004. He had an lower extremity angiogram in [place] in 2009 after abnormal dopplers that showed patent Fem-Fem. He had a Myoview in 2012 that was low risk. Recent LE dopplers from 09/24/12 showed a drop in his Lt ABI to 0.68. He is for f/u in 6 mos. Today he comes to the office complaining of Lt leg "swelling" since May. He denies any pain, hemoptysis, SOB, or recent injury. He denies any orthopnea or PND. He has been treating this himself with compression stockings that work nicely.    Current Outpatient Prescriptions  Medication Sig Dispense Refill  . amLODipine (NORVASC) 10 MG tablet Take 10 mg by mouth daily.      Marland Kitchen aspirin 325 MG EC tablet Take 325 mg by mouth daily.      . clopidogrel (PLAVIX) 75 MG tablet Take 75 mg by mouth daily.      . colchicine 0.6 MG tablet Take 0.6 mg by mouth daily as needed.      . cyclobenzaprine (FLEXERIL) 10 MG tablet Take 10 mg by mouth 3 (three) times daily as needed. For muscle spasms      . dexlansoprazole (DEXILANT) 60 MG capsule Take 60 mg by mouth daily.      . furosemide (LASIX) 40 MG tablet Take 40 mg by mouth daily.      Marland Kitchen HYDROcodone-acetaminophen (VICODIN) 5-500 MG per tablet Take 1 tablet by mouth every 6 (six) hours as needed. For pain  65 tablet  1  . insulin glargine (LANTUS) 100 UNIT/ML injection Inject 35 Units into the skin daily after supper.       . insulin glulisine (APIDRA) 100 UNIT/ML injection Inject 10 Units into the skin daily before supper.       . Iron-Vitamins (GERITOL PO) Take 1 capsule by mouth every morning.      . methocarbamol (ROBAXIN) 500 MG tablet Take 500 mg by mouth daily as needed. For muscle relaxer      . metoprolol succinate (TOPROL-XL) 100 MG 24 hr tablet Take 100 mg by mouth daily.  Take with or immediately following a meal.      . niacin (NIASPAN) 500 MG CR tablet Take 500 mg by mouth every morning.      . niacin-simvastatin (SIMCOR) 1000-20 MG 24 hr tablet Take 1 tablet by mouth at bedtime.      . pantoprazole (PROTONIX) 40 MG tablet Take 40 mg by mouth daily as needed. For acid reflux      . pregabalin (LYRICA) 50 MG capsule Take 50 mg by mouth every morning.      . quinapril (ACCUPRIL) 40 MG tablet Take 40 mg by mouth at bedtime.      . simvastatin (ZOCOR) 20 MG tablet Take 20 mg by mouth every evening.      . sitaGLIPtin (JANUVIA) 100 MG tablet Take 100 mg by mouth daily.      . ursodiol (ACTIGALL) 250 MG tablet Take 250 mg by mouth daily.      . OMEGA-3 KRILL OIL PO Take 1 capsule by mouth every morning.       No current facility-administered medications for this visit.    No Known Allergies  History   Social History  . Marital  Status: Married    Spouse Name: N/A    Number of Children: N/A  . Years of Education: N/A   Occupational History  . Not on file.   Social History Main Topics  . Smoking status: Former Smoker -- 0.50 packs/day for 50 years    Types: Cigarettes    Quit date: 10/18/2012  . Smokeless tobacco: Never Used  . Alcohol Use: No  . Drug Use: No  . Sexually Active: Not Currently   Other Topics Concern  . Not on file   Social History Narrative   Lives with wife, has grown children. Still active without cane or walker.      Review of Systems: General: negative for chills, fever, night sweats or weight changes.  Cardiovascular: negative for chest pain, dyspnea on exertion, orthopnea, palpitations, paroxysmal nocturnal dyspnea or shortness of breath Dermatological: negative for rash Respiratory: negative for cough or wheezing Urologic: negative for hematuria Abdominal: negative for nausea, vomiting, diarrhea, bright red blood per rectum, melena, or hematemesis Neurologic: negative for visual changes, syncope, or dizziness He had  hospitalization for pancreatitis in March 2014 He has daytime somnolence, snoring, and poor sleep patterns He has been under a lot of stress- 3d seperation All other systems reviewed and are otherwise negative except as noted above.    Blood pressure 140/66, pulse 60, height 5\' 7"  (1.702 m), weight 92.987 kg (205 lb).  General appearance: alert, cooperative, no distress and moderately obese Lungs: clear to auscultation bilaterally Heart: regular rate and rhythm, S1, S2 normal, no murmur, click, rub or gallop Extremities: no edema with compression stockings on    ASSESSMENT AND PLAN:   Edema- Lt leg, new since May I suspect this is venous insufficiency but it is unilateral and recent in onset, r/o DVT  Unspecified sleep apnea by history He admits to daytime somnolence, poor sleep patterns, snoring  PVD (peripheral vascular disease)- Hx F-F in [place], last dopplers OK 6/13 Recent drop in his Lt ABI, no claudication history  CAD (coronary artery disease)- Hx of LAD stent in '04- low risk Myoview 6/12 No angina  Situational stress- going through his 3d seperation (same wife) .    PLAN  Check venous dopplers, avoid salt, sleep study. Consider decreasing his Norvasc if needed but his edema was quit well controlled with his compression stockings. I think he was concerned about recent abnormal dopplers and thought there may be a connection between that and his edema.    Zachary Hoffman KPA-C 10/25/2012 4:53 PM

## 2012-10-25 NOTE — Assessment & Plan Note (Signed)
He admits to daytime somnolence, poor sleep patterns, snoring

## 2012-10-28 ENCOUNTER — Other Ambulatory Visit (HOSPITAL_COMMUNITY): Payer: Self-pay | Admitting: Cardiovascular Disease

## 2012-10-28 ENCOUNTER — Ambulatory Visit (HOSPITAL_COMMUNITY)
Admission: RE | Admit: 2012-10-28 | Discharge: 2012-10-28 | Disposition: A | Discharge status: Home / Self Care | Payer: Medicare Other | Source: Ambulatory Visit | Attending: Cardiovascular Disease | Admitting: Cardiovascular Disease

## 2012-10-28 DIAGNOSIS — R609 Edema, unspecified: Secondary | ICD-10-CM

## 2012-10-28 DIAGNOSIS — M7989 Other specified soft tissue disorders: Secondary | ICD-10-CM | POA: Insufficient documentation

## 2012-10-28 NOTE — Progress Notes (Signed)
Left Lower Extremity Venous Duplex Completed. Zachary Hoffman

## 2012-11-03 ENCOUNTER — Encounter: Payer: Self-pay | Admitting: *Deleted

## 2012-12-02 DIAGNOSIS — G4733 Obstructive sleep apnea (adult) (pediatric): Secondary | ICD-10-CM

## 2012-12-27 DIAGNOSIS — G4733 Obstructive sleep apnea (adult) (pediatric): Secondary | ICD-10-CM

## 2013-02-22 ENCOUNTER — Ambulatory Visit (INDEPENDENT_AMBULATORY_CARE_PROVIDER_SITE_OTHER): Payer: Medicare Other | Admitting: Cardiovascular Disease

## 2013-02-22 ENCOUNTER — Encounter: Payer: Self-pay | Admitting: Cardiovascular Disease

## 2013-02-22 VITALS — BP 122/60 | HR 70 | Ht 67.0 in | Wt 193.6 lb

## 2013-02-22 DIAGNOSIS — I10 Essential (primary) hypertension: Secondary | ICD-10-CM

## 2013-02-22 DIAGNOSIS — E785 Hyperlipidemia, unspecified: Secondary | ICD-10-CM | POA: Insufficient documentation

## 2013-02-22 DIAGNOSIS — G4733 Obstructive sleep apnea (adult) (pediatric): Secondary | ICD-10-CM

## 2013-02-22 DIAGNOSIS — E669 Obesity, unspecified: Secondary | ICD-10-CM

## 2013-02-22 DIAGNOSIS — E119 Type 2 diabetes mellitus without complications: Secondary | ICD-10-CM

## 2013-02-22 NOTE — Progress Notes (Signed)
Patient ID: Zachary Hoffman, male   DOB: January 05, 1941, 72 y.o.   MRN: [id number]     HPI: Zachary Hoffman, is a 72 y.o. male who presents to sleep clinic for evaluation following initiation of CPAP therapy for obstructive sleep apnea.  [name] is a 72 year old Afro-American gentleman who is followed by Dr. 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 He also has a history of hyperlipidemia due to concerns for sleep apnea he was referred for a diagnostic polysomnogram on 12/02/2012. At that time, he did have significant daytime sleepiness with an Epworth sleepiness scale and pushing at 15. He complained of loud snoring, witnessed apnea, gasping for breath, nonrestorative sleep, difficulty falling asleep as well as waking to early. These complaints have persisted for greater than 2 years. He was found to have moderate sleep apnea with AHI of 20.0 and REM sleep this increased to 25.9 per hour. He also had a significant positional component. Oxygen saturation dropped to 79% with non-REM sleep and 81% with REM sleep. He had evidence for loud snoring. A subsequent CPAP titration trial was done on 12/27/2012 he was titrated up to 16 cm water pressure.  Since initiating CPAP therapy his sleep pattern has improved. Her, he does have complaints where he feels that there is a leak around his mask. He also has noticed some dry mouth and some postnasal drip which he contributes to the CPAP treatment.  Recent download was obtained from October 18 through 02/20/2013. He said status is 90% at 27/30 days. 23 days, or 77% were greater than 4 hours. He is averaging 6 hours and 30 minutes of use per night and a 16 cm water pressure. HIA 6.2. However, review of his download indicates a significant leak a daily basis. He has not yet changed his cushion  around his nasal mask.   Epworth Sleepiness Scale: Situation   Chance of Dozing/Sleeping (0 = never , 1 = slight chance , 2 = moderate chance , 3 = high chance )   sitting and reading 2   watching TV 2   sitting inactive in a public place 2   being a passenger in a motor vehicle for an hour or more 0   lying down in the afternoon 2   sitting and talking to someone 2   sitting quietly after lunch (no alcohol) 2   while stopped for a few minutes in traffic as the driver 0   Total Score  12    Past Medical History  Diagnosis Date  . Hypertension   . CHF (congestive heart failure)     Echo 07/2006 with normal LV fxn, and diastolic dysfxn  . MI (myocardial infarction)     S/p stenting per patient. Last stress 04/2007 negative   . Peripheral arterial disease     S/p left femoro-femoral bypass. LE cath by Dr. Einar Gip 09/2007 without significant stenosis  . Tobacco abuse   . Diverticulosis 05/2009    Noted on screening colo   . Coronary artery disease   . Headache(784.0)   . Diabetes mellitus     On insulin   . Neuropathy   . 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 Dyslipidemia   . Pancreatitis     P/H    Past Surgical History  Procedure Laterality Date  .  Replacement total hip w/  resurfacing implants      Left  . Femoral-femoral bypass graft      In [place], on the left. Patent on cath 09/2007  . Coronary angioplasty with stent placement  2004    Mid LAD, 95% first obtuse marginal   . Doppler echocardiography  07/28/2006    Mod. LVH,EF =>55%,LA mildly dilated,mitral annular ca+,AOV mildly sclerotic,trace AI  . Nuclear stress test  09/17/2010    No ischemia  . Lower extremity angiogram  09/20/2007    no significant stenosis    No Known Allergies  Current Outpatient Prescriptions  Medication Sig Dispense Refill  . amLODipine (NORVASC) 10 MG tablet Take 10 mg by mouth daily.      Marland Kitchen aspirin 81 MG tablet Take 81 mg by mouth daily.      . clopidogrel (PLAVIX)  75 MG tablet Take 75 mg by mouth daily.      . colchicine 0.6 MG tablet Take 0.6 mg by mouth daily as needed.      . cyclobenzaprine (FLEXERIL) 10 MG tablet Take 10 mg by mouth 3 (three) times daily as needed. For muscle spasms      . dexlansoprazole (DEXILANT) 60 MG capsule Take 60 mg by mouth daily.      . furosemide (LASIX) 40 MG tablet Take 40 mg by mouth daily.      Marland Kitchen HYDROcodone-acetaminophen (VICODIN) 5-500 MG per tablet Take 1 tablet by mouth every 6 (six) hours as needed. For pain  65 tablet  1  . insulin glargine (LANTUS) 100 UNIT/ML injection Inject 35 Units into the skin daily after supper.       . insulin glulisine (APIDRA) 100 UNIT/ML injection Inject 10 Units into the skin daily before supper.       . Iron-Vitamins (GERITOL PO) Take 1 capsule by mouth every morning.      . Linaclotide (LINZESS PO) Take 1 capsule by mouth daily.      . methocarbamol (ROBAXIN) 500 MG tablet Take 500 mg by mouth daily as needed. For muscle relaxer      . metoprolol succinate (TOPROL-XL) 100 MG 24 hr tablet Take 100 mg by mouth daily. Take with or immediately following a meal.      . niacin (NIASPAN) 500 MG CR tablet Take 500 mg by mouth every morning.      Marland Kitchen OMEGA-3 KRILL OIL PO Take 1 capsule by mouth every morning.      . pantoprazole (PROTONIX) 40 MG tablet Take 40 mg by mouth daily as needed. For acid reflux      . pregabalin (LYRICA) 50 MG capsule Take 50 mg by mouth daily. 2 tabs      . quinapril (ACCUPRIL) 40 MG tablet Take 40 mg by mouth at bedtime.      . simvastatin (ZOCOR) 20 MG tablet Take 20 mg by mouth every evening.      . sitaGLIPtin (JANUVIA) 100 MG tablet Take 100 mg by mouth daily.      . ursodiol (ACTIGALL) 250 MG tablet Take 250 mg by mouth daily.       No current facility-administered medications for this visit.    Social history is that he was born in [place] the he is married has 5 children and grandchildren 3 great-grandchildren. He continues to smoke cigarettes  currently one half to one third pack a day. No alcohol use. He does not routinely exercise.  ROS negative for fever, chills or  night sweats. He is unaware of breakthrough snoring. He denies restless legs. He denies parasomnias. There is no cataplexy. He denies vivid dreams.. He does at times no chest congestion. He denies tachycardia palpitations. He denies recent angina or chest pressure. He denies abdominal pain. He denies nausea vomiting or diarrhea. He denies recent GU symptoms. He denies change in GI pattern. He denies significant edema. He denies myalgias. He denies cold or heat intolerance. He does have diabetes Other comprehensive 12 point system review is negative.  PE BP 122/60  Pulse 70  Ht 5\' 7"  (1.702 m)  Wt 193 lb 9.6 oz (87.816 kg)  BMI 30.31 kg/m2  General: Alert, oriented, no distress.  Skin: normal turgor, no rashes HEENT: Normocephalic, atraumatic. Pupils round and reactive; sclera anicteric; Fundi bilaterally narrowing the Nose without nasal septal hypertrophy Mouth/Parynx benign; Mallinpatti scale 2 Neck: No JVD, no carotid briuts Lungs: clear to ausculatation and percussion; no wheezing or rales Heart: RRR, s1 s2 normal  1/6 systolic murmur. Abdomen: soft, nontender; no hepatosplenomehaly, BS+; abdominal aorta nontender and not dilated by palpation. Pulses 2+ Extremities: no clubbinbg cyanosis or edema, Homan's sign negative  Neurologic: grossly nonfocal    LABS:  BMET    Component Value Date/Time   NA 140 06/11/2011 0613   K 4.3 06/11/2011 0613   CL 108 06/11/2011 0613   CO2 23 06/11/2011 0613   GLUCOSE 66* 06/11/2011 0613   BUN 12 06/11/2011 0613   CREATININE 1.20 06/11/2011 0613   CALCIUM 9.6 06/11/2011 0613   GFRNONAA 60* 06/11/2011 0613   GFRAA 69* 06/11/2011 0613     Hepatic Function Panel     Component Value Date/Time   PROT 6.7 06/11/2011 0613   ALBUMIN 3.2* 06/11/2011 0613   AST 17 06/11/2011 0613   ALT 17 06/11/2011 0613   ALKPHOS 79 06/11/2011 0613   BILITOT 0.4  06/11/2011 0613   BILIDIR <0.1 06/10/2011 0229   IBILI NOT CALCULATED 06/10/2011 0229     CBC    Component Value Date/Time   WBC 5.5 06/11/2011 0613   RBC 4.28 06/11/2011 0613   HGB 12.1* 06/11/2011 0613   HCT 35.4* 06/11/2011 0613   PLT 206 06/11/2011 0613   MCV 82.7 06/11/2011 0613   MCH 28.3 06/11/2011 0613   MCHC 34.2 06/11/2011 0613   RDW 14.5 06/11/2011 0613   LYMPHSABS 2.3 06/10/2011 0229   MONOABS 0.6 06/10/2011 0229   EOSABS 0.2 06/10/2011 0229   BASOSABS 0.0 06/10/2011 0229     BNP No results found for this basename: probnp    Lipid Panel     Component Value Date/Time   CHOL  Value: 83        ATP III CLASSIFICATION:  <200     mg/dL   Desirable  200-239  mg/dL   Borderline High  >=240    mg/dL   High 01/18/2007 0539   TRIG 70 01/18/2007 0539   HDL 21* 01/18/2007 0539   CHOLHDL 4.0 01/18/2007 0539   VLDL 14 01/18/2007 0539   LDLCALC  Value: 48        Total Cholesterol/HDL:CHD Risk Coronary Heart Disease Risk Table                     Men   Women  1/2 Average Risk   3.4   3.3 01/18/2007 0539     RADIOLOGY: No results found.    ASSESSMENT AND PLAN: Impression is that Mr. Gwyndolyn Saxon Befort is a 72 year old Afro-American  gentleman with significant neurovascular comorbidities including coronary artery disease, peripheral vascular disease, diabetes mellitus, hypertension, hyperlipidemia. The patient does have moderate obstructive sleep apnea with documented significant oxygen desaturation to 79% with non-REM sleep on his initial study. Unfortunately continues to smoke cigarettes. We talked about the importance of smoking cessation. Presently, his AHI is 6.2 on his most recent download. He is having sizable suite. We did review with him how to adjust his humidification temperature as well as level which may be playing a role in some of his chest congestion and dry mouth. I do believe he has a leak as a result of his condition or mask. He will be refitted for this and have new supplies sent today upon  his insurance timing. A  download will be  obtained at 2 weeks after the new mask and cushion is applied to see if there has been significant correction of this leak. We talked about 100% days of use and even greater sleep duration per night if possible. He is meeting Medicare compliance standards.  I will be available in the future if problems arise.     Troy Sine, MD, [facility]  02/22/2013 10:57 AM

## 2013-02-22 NOTE — Patient Instructions (Signed)
Your physician recommends that you schedule a follow-up appointment with Dr. Claiborne Billings as needed if problems arise for sleep.

## 2013-03-10 ENCOUNTER — Telehealth: Payer: Self-pay | Admitting: *Deleted

## 2013-03-10 NOTE — Telephone Encounter (Signed)
Faxed signed CPAP supply order back.

## 2013-05-05 ENCOUNTER — Telehealth: Payer: Self-pay | Admitting: *Deleted

## 2013-05-05 NOTE — Telephone Encounter (Signed)
Returned CPAP order supply order.

## 2013-06-02 ENCOUNTER — Ambulatory Visit: Payer: Medicare Other | Admitting: Cardiovascular Disease

## 2013-07-12 ENCOUNTER — Ambulatory Visit: Payer: Medicare Other | Admitting: Cardiovascular Disease

## 2013-08-10 ENCOUNTER — Ambulatory Visit: Payer: Medicare Other | Admitting: Cardiovascular Disease

## 2013-08-22 ENCOUNTER — Telehealth: Payer: Self-pay | Admitting: Cardiovascular Disease

## 2013-08-25 NOTE — Telephone Encounter (Signed)
Closed encounter °

## 2013-09-07 ENCOUNTER — Telehealth (HOSPITAL_COMMUNITY): Payer: Self-pay | Admitting: *Deleted

## 2013-09-12 ENCOUNTER — Ambulatory Visit (HOSPITAL_COMMUNITY)
Admission: RE | Admit: 2013-09-12 | Discharge: 2013-09-12 | Disposition: A | Discharge status: Home / Self Care | Payer: Medicare Other | Source: Ambulatory Visit | Attending: Cardiovascular Disease | Admitting: Cardiovascular Disease

## 2013-09-12 DIAGNOSIS — I739 Peripheral vascular disease, unspecified: Secondary | ICD-10-CM | POA: Diagnosis present

## 2013-09-12 NOTE — Progress Notes (Signed)
Arterial Duplex Lower Ext. Completed. Alylah Blakney, BS, RDMS, RVT  

## 2013-09-21 ENCOUNTER — Encounter (HOSPITAL_COMMUNITY): Payer: Self-pay | Admitting: Emergency Medicine

## 2013-09-21 DIAGNOSIS — E785 Hyperlipidemia, unspecified: Secondary | ICD-10-CM | POA: Diagnosis not present

## 2013-09-21 DIAGNOSIS — I509 Heart failure, unspecified: Secondary | ICD-10-CM | POA: Insufficient documentation

## 2013-09-21 DIAGNOSIS — Z9861 Coronary angioplasty status: Secondary | ICD-10-CM | POA: Insufficient documentation

## 2013-09-21 DIAGNOSIS — Z8719 Personal history of other diseases of the digestive system: Secondary | ICD-10-CM | POA: Insufficient documentation

## 2013-09-21 DIAGNOSIS — Z7902 Long term (current) use of antithrombotics/antiplatelets: Secondary | ICD-10-CM | POA: Diagnosis not present

## 2013-09-21 DIAGNOSIS — M795 Residual foreign body in soft tissue: Secondary | ICD-10-CM | POA: Insufficient documentation

## 2013-09-21 DIAGNOSIS — Z7982 Long term (current) use of aspirin: Secondary | ICD-10-CM | POA: Diagnosis not present

## 2013-09-21 DIAGNOSIS — I129 Hypertensive chronic kidney disease with stage 1 through stage 4 chronic kidney disease, or unspecified chronic kidney disease: Secondary | ICD-10-CM | POA: Insufficient documentation

## 2013-09-21 DIAGNOSIS — Z794 Long term (current) use of insulin: Secondary | ICD-10-CM | POA: Diagnosis not present

## 2013-09-21 DIAGNOSIS — N182 Chronic kidney disease, stage 2 (mild): Secondary | ICD-10-CM | POA: Insufficient documentation

## 2013-09-21 DIAGNOSIS — I2510 Atherosclerotic heart disease of native coronary artery without angina pectoris: Secondary | ICD-10-CM | POA: Insufficient documentation

## 2013-09-21 DIAGNOSIS — Z87891 Personal history of nicotine dependence: Secondary | ICD-10-CM | POA: Diagnosis not present

## 2013-09-21 DIAGNOSIS — E119 Type 2 diabetes mellitus without complications: Secondary | ICD-10-CM | POA: Insufficient documentation

## 2013-09-21 DIAGNOSIS — G589 Mononeuropathy, unspecified: Secondary | ICD-10-CM | POA: Insufficient documentation

## 2013-09-21 DIAGNOSIS — I252 Old myocardial infarction: Secondary | ICD-10-CM | POA: Diagnosis not present

## 2013-09-21 NOTE — ED Notes (Signed)
Pt. reports insulin needle broke and left imbedded in his abdomen this evening. No bleeding .

## 2013-09-22 ENCOUNTER — Emergency Department (HOSPITAL_COMMUNITY)
Admission: EM | Admit: 2013-09-22 | Discharge: 2013-09-22 | Disposition: A | Discharge status: Home / Self Care | Payer: Medicare Other | Attending: Emergency Medicine | Admitting: Emergency Medicine

## 2013-09-22 DIAGNOSIS — M795 Residual foreign body in soft tissue: Secondary | ICD-10-CM

## 2013-09-22 NOTE — ED Provider Notes (Signed)
CSN: [id number]     Arrival date & time 09/21/13  2344 History   First MD Initiated Contact with Patient 09/22/13 0231     No chief complaint on file.    (Consider location/radiation/quality/duration/timing/severity/associated sxs/prior Treatment) The history is provided by the patient.  Zachary Hoffman is a 73 y.o. male hx of HTN, CHF, MI, DM here with insulin needle in his abdomen. He was giving himself his night time insulin injection at 11 pm and pulled out the syringe but not the needle. Denies fever, just sense of discomfort. Minimal swelling. His tetanus is up to date.    Past Medical History  Diagnosis Date  . Hypertension   . CHF (congestive heart failure)     Echo 07/2006 with normal LV fxn, and diastolic dysfxn  . MI (myocardial infarction)     S/p stenting per patient. Last stress 04/2007 negative   . Peripheral arterial disease     S/p left femoro-femoral bypass. LE cath by Dr. Einar Gip 09/2007 without significant stenosis  . Tobacco abuse   . Diverticulosis 05/2009    Noted on screening colo   . Coronary artery disease   . Headache(784.0)   . Diabetes mellitus     On insulin   . Neuropathy   . 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 Dyslipidemia   . Pancreatitis     P/H   Past Surgical History  Procedure Laterality Date  . Replacement total hip w/  resurfacing implants      Left  . Femoral-femoral bypass graft      In [place], on the left. Patent on cath 09/2007  . Coronary angioplasty with stent placement  2004    Mid LAD, 95% first obtuse marginal   . Doppler echocardiography  07/28/2006    Mod. LVH,EF =>55%,LA mildly dilated,mitral annular ca+,AOV mildly sclerotic,trace AI  . Nuclear stress test  09/17/2010    No ischemia  . Lower extremity angiogram  09/20/2007    no significant stenosis   Family History  Problem Relation Age of Onset  . Stroke Mother   . Diabetes Father   . Cancer Father    History  Substance Use Topics  . Smoking  status: Former Smoker -- 0.50 packs/day for 50 years    Types: Cigarettes    Quit date: 10/18/2012  . Smokeless tobacco: Never Used  . Alcohol Use: No    Review of Systems  Skin: Positive for wound.  All other systems reviewed and are negative.     Allergies  Review of patient's allergies indicates no known allergies.  Home Medications   Prior to Admission medications   Medication Sig Start Date End Date Taking? Authorizing Provider  amLODipine (NORVASC) 10 MG tablet Take 10 mg by mouth daily.    Historical Provider, MD  aspirin 81 MG tablet Take 81 mg by mouth daily.    Historical Provider, MD  clopidogrel (PLAVIX) 75 MG tablet Take 75 mg by mouth daily.    Historical Provider, MD  colchicine 0.6 MG tablet Take 0.6 mg by mouth daily as needed.    Historical Provider, MD  cyclobenzaprine (FLEXERIL) 10 MG tablet Take 10 mg by mouth 3 (three) times daily as needed. For muscle spasms    Historical Provider, MD  dexlansoprazole (DEXILANT) 60 MG capsule Take 60 mg by mouth daily.    Historical Provider, MD  furosemide (LASIX) 40 MG tablet Take 40 mg by mouth daily.    Historical  Provider, MD  HYDROcodone-acetaminophen (VICODIN) 5-500 MG per tablet Take 1 tablet by mouth every 6 (six) hours as needed. For pain 06/11/11   Theodis Blaze, MD  insulin glargine (LANTUS) 100 UNIT/ML injection Inject 35 Units into the skin daily after supper.     Historical Provider, MD  insulin glulisine (APIDRA) 100 UNIT/ML injection Inject 10 Units into the skin daily before supper.     Historical Provider, MD  Iron-Vitamins (GERITOL PO) Take 1 capsule by mouth every morning.    Historical Provider, MD  Linaclotide (LINZESS PO) Take 1 capsule by mouth daily.    Historical Provider, MD  methocarbamol (ROBAXIN) 500 MG tablet Take 500 mg by mouth daily as needed. For muscle relaxer    Historical Provider, MD  metoprolol succinate (TOPROL-XL) 100 MG 24 hr tablet Take 100 mg by mouth daily. Take with or immediately  following a meal.    Historical Provider, MD  niacin (NIASPAN) 500 MG CR tablet Take 500 mg by mouth every morning.    Historical Provider, MD  OMEGA-3 KRILL OIL PO Take 1 capsule by mouth every morning.    Historical Provider, MD  pantoprazole (PROTONIX) 40 MG tablet Take 40 mg by mouth daily as needed. For acid reflux    Historical Provider, MD  pregabalin (LYRICA) 50 MG capsule Take 50 mg by mouth daily. 2 tabs    Historical Provider, MD  quinapril (ACCUPRIL) 40 MG tablet Take 40 mg by mouth at bedtime.    Historical Provider, MD  simvastatin (ZOCOR) 20 MG tablet Take 20 mg by mouth every evening.    Historical Provider, MD  sitaGLIPtin (JANUVIA) 100 MG tablet Take 100 mg by mouth daily.    Historical Provider, MD  ursodiol (ACTIGALL) 250 MG tablet Take 250 mg by mouth daily.    Historical Provider, MD   BP 124/60  Pulse 70  Temp(Src) 98.2 F (36.8 C) (Oral)  Resp 14  Ht 5\' 7"  (1.702 m)  Wt 207 lb (93.895 kg)  BMI 32.41 kg/m2  SpO2 98% Physical Exam  Nursing note and vitals reviewed. Constitutional: He is oriented to person, place, and time. He appears well-developed.  HENT:  Head: Normocephalic.  Eyes: Conjunctivae are normal. Pupils are equal, round, and reactive to light.  Neck: Normal range of motion.  Cardiovascular: Normal rate.   Pulmonary/Chest: Effort normal.  Abdominal: Soft. Bowel sounds are normal.  L side abdomen, small puncture wound with possible foreign body. No purulent drainage   Musculoskeletal: Normal range of motion. He exhibits no edema.  Neurological: He is alert and oriented to person, place, and time.  Skin: Skin is warm and dry.  Psychiatric: He has a normal mood and affect. His behavior is normal. Judgment and thought content normal.    ED Course  Procedures (including critical care time)  Foreign body removal Area numbed with 5 cc 1% lido with epi. Area explored. No needle found.   EMERGENCY DEPARTMENT US SOFT TISSUE INTERPRETATION "Study:  Limited Ultrasound of the noted body part in comments below"  INDICATIONS: Other (refer to comments) Multiple views of the body part are obtained with a multi-frequency linear probe  PERFORMED BY:  Myself  IMAGES ARCHIVED?: Yes  SIDE:Left  BODY PART:Abdominal wall  FINDINGS: Other possible small needle  LIMITATIONS:  Emergent Procedure  INTERPRETATION:   Possible small needle   COMMENT: possible needle in sub Q tissue    Labs Review Labs Reviewed - No data to display  Imaging Review No results  found.   EKG Interpretation None      MDM   Final diagnoses:  None    Zachary Hoffman is a 73 y.o. male here with possible insulin needle in abdomen. US showed possible needle. Attempted to remove it but was unable to visualize it. I called Dr. Barry Dienes, surgeon on call, who states that it is usually difficult to visualize and that the needle will likely make its way out. Told patient to watch for signs of infection and apply neosporin on it.    Wandra Arthurs, MD 09/22/13 [phone]

## 2013-09-22 NOTE — ED Notes (Signed)
Dr. Yao at bedside. 

## 2013-09-22 NOTE — Discharge Instructions (Signed)
Apply neosporin to the area daily.   Keep wound clean and dry.   The needle will likely makes its way to the surface in several weeks.   Follow up with your doctor.   Return to ER if you have severe pain, fever, purulent drainage from wound.

## 2013-09-22 NOTE — ED Notes (Signed)
Discharge instructions reviewed with pt. Pt verbalized understanding.   

## 2013-10-20 ENCOUNTER — Encounter: Payer: Self-pay | Admitting: Cardiovascular Disease

## 2013-10-20 ENCOUNTER — Ambulatory Visit (INDEPENDENT_AMBULATORY_CARE_PROVIDER_SITE_OTHER): Payer: Medicare Other | Admitting: Cardiovascular Disease

## 2013-10-20 VITALS — BP 160/64 | HR 63 | Resp 16 | Ht 67.0 in | Wt 211.9 lb

## 2013-10-20 DIAGNOSIS — I2510 Atherosclerotic heart disease of native coronary artery without angina pectoris: Secondary | ICD-10-CM

## 2013-10-20 DIAGNOSIS — I739 Peripheral vascular disease, unspecified: Secondary | ICD-10-CM

## 2013-10-20 DIAGNOSIS — R3919 Other difficulties with micturition: Secondary | ICD-10-CM

## 2013-10-20 DIAGNOSIS — E1121 Type 2 diabetes mellitus with diabetic nephropathy: Secondary | ICD-10-CM

## 2013-10-20 DIAGNOSIS — R609 Edema, unspecified: Secondary | ICD-10-CM

## 2013-10-20 DIAGNOSIS — R39198 Other difficulties with micturition: Secondary | ICD-10-CM

## 2013-10-20 DIAGNOSIS — N289 Disorder of kidney and ureter, unspecified: Secondary | ICD-10-CM

## 2013-10-20 DIAGNOSIS — E1129 Type 2 diabetes mellitus with other diabetic kidney complication: Secondary | ICD-10-CM

## 2013-10-20 DIAGNOSIS — Z79899 Other long term (current) drug therapy: Secondary | ICD-10-CM

## 2013-10-20 DIAGNOSIS — E785 Hyperlipidemia, unspecified: Secondary | ICD-10-CM

## 2013-10-20 DIAGNOSIS — N058 Unspecified nephritic syndrome with other morphologic changes: Secondary | ICD-10-CM

## 2013-10-20 MED ORDER — FUROSEMIDE 80 MG PO TABS: 80.0000 mg | ORAL_TABLET | Freq: Every day | ORAL | Status: DC

## 2013-10-20 NOTE — Patient Instructions (Addendum)
Your physician recommends that you return for lab work in: [place].   Please have a lower extremity venous doppler - done in our office.   Your physician has recommended you make the following change in your medication: INCREASE furosemide (lasix) to 80mg  once daily. (you can take 2 of your 40mg  tablets until they run out)   Please follow up with an extender in 4-6 weeks.

## 2013-10-20 NOTE — Progress Notes (Signed)
Patient ID: Zachary Hoffman, male   DOB: 1940/12/06, 73 y.o.   MRN: [id number]      Reason for office visit Leg edema, PAD  [name] presents with complaints of leg swelling. Both legs are edematous but the left one is markedly more edematous and uncomfortable. He has an extensive history of peripheral arterial disease. In 2009 he had a left common femoral to superficial femoral artery bypass procedure for occlusive disease. He also is a history of coronary artery disease and has previously received a stent to the LAD (2004, [place], no details known).  Has occasional mild exertional dyspnea. He continues to smoke 6 cigarettes a day. He tried using a nicotine patch and he cigarettes and was still unable to quit completely. He does not have angina pectoris or intermittent claudication. He has not experienced syncope, dizziness, cough or hemoptysis.  He has previously had problems with lower extremity edema, usually resolved by using compression stockings. The swelling has never been quite this severe. A year ago he had venous Dopplers that did not show evidence of a DVT.  His most recent arterial Doppler study was performed in June of 2015. 1.0 and an improved left ABI of 0.87 (had been 0.68). The femoral to femoral bypass graft was widely patent with normal velocities. He has a one vessel runoff via the peroneal artery on the left. On the right side the anterior tibial artery is occluded. Normal LV EF by echo in 2008. Normal myocardial perfusion by nuclear study 2012  No Known Allergies  Current Outpatient Prescriptions  Medication Sig Dispense Refill  . amLODipine (NORVASC) 10 MG tablet Take 10 mg by mouth daily.      Marland Kitchen aspirin 81 MG tablet Take 81 mg by mouth daily.      . clopidogrel (PLAVIX) 75 MG tablet Take 75 mg by mouth daily.      . colchicine 0.6 MG tablet Take 0.6 mg by mouth daily as needed.      . cyclobenzaprine (FLEXERIL) 10 MG tablet Take 10 mg by mouth 3 (three) times daily  as needed. For muscle spasms      . dexlansoprazole (DEXILANT) 60 MG capsule Take 60 mg by mouth daily.      . furosemide (LASIX) 80 MG tablet Take 1 tablet (80 mg total) by mouth daily.  30 tablet  6  . HYDROcodone-acetaminophen (VICODIN) 5-500 MG per tablet Take 1 tablet by mouth every 6 (six) hours as needed. For pain  65 tablet  1  . insulin glargine (LANTUS) 100 UNIT/ML injection Inject 35 Units into the skin daily after supper.       . insulin glulisine (APIDRA) 100 UNIT/ML injection Inject 10 Units into the skin daily before supper.       . Iron-Vitamins (GERITOL PO) Take 1 capsule by mouth every morning.      . Linaclotide (LINZESS PO) Take 1 capsule by mouth daily.      . methocarbamol (ROBAXIN) 500 MG tablet Take 500 mg by mouth daily as needed. For muscle relaxer      . metoprolol succinate (TOPROL-XL) 100 MG 24 hr tablet Take 100 mg by mouth daily. Take with or immediately following a meal.      . niacin (NIASPAN) 500 MG CR tablet Take 500 mg by mouth every morning.      Marland Kitchen OMEGA-3 KRILL OIL PO Take 1 capsule by mouth every morning.      . pantoprazole (PROTONIX) 40 MG tablet Take  40 mg by mouth daily as needed. For acid reflux      . pregabalin (LYRICA) 50 MG capsule Take 50 mg by mouth daily. 2 tabs      . quinapril (ACCUPRIL) 40 MG tablet Take 40 mg by mouth at bedtime.      . simvastatin (ZOCOR) 20 MG tablet Take 20 mg by mouth every evening.      . ursodiol (ACTIGALL) 250 MG tablet Take 250 mg by mouth daily.       No current facility-administered medications for this visit.    Past Medical History  Diagnosis Date  . Hypertension   . CHF (congestive heart failure)     Echo 07/2006 with normal LV fxn, and diastolic dysfxn  . MI (myocardial infarction)     S/p stenting per patient. Last stress 04/2007 negative   . Peripheral arterial disease     S/p left femoro-femoral bypass. LE cath by Dr. Einar Gip 09/2007 without significant stenosis  . Tobacco abuse   . Diverticulosis  05/2009    Noted on screening colo   . Coronary artery disease   . Headache(784.0)   . Diabetes mellitus     On insulin   . Neuropathy   . 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 Dyslipidemia   . Pancreatitis     P/H    Past Surgical History  Procedure Laterality Date  . Replacement total hip w/  resurfacing implants      Left  . Femoral-femoral bypass graft      In [place], on the left. Patent on cath 09/2007  . Coronary angioplasty with stent placement  2004    Mid LAD, 95% first obtuse marginal   . Doppler echocardiography  07/28/2006    Mod. LVH,EF =>55%,LA mildly dilated,mitral annular ca+,AOV mildly sclerotic,trace AI  . Nuclear stress test  09/17/2010    No ischemia  . Lower extremity angiogram  09/20/2007    no significant stenosis    Family History  Problem Relation Age of Onset  . Stroke Mother   . Diabetes Father   . Cancer Father     History   Social History  . Marital Status: Married    Spouse Name: N/A    Number of Children: N/A  . Years of Education: N/A   Occupational History  . Not on file.   Social History Main Topics  . Smoking status: Former Smoker -- 0.50 packs/day for 50 years    Types: Cigarettes    Quit date: 10/18/2012  . Smokeless tobacco: Never Used  . Alcohol Use: No  . Drug Use: No  . Sexual Activity: Not Currently   Other Topics Concern  . Not on file   Social History Narrative   Lives with wife, has grown children. Still active without cane or walker.     Review of systems: The patient specifically denies any chest pain at rest or with exertion, dyspnea at rest or with exertion, orthopnea, paroxysmal nocturnal dyspnea, syncope, palpitations, focal neurological deficits, intermittent claudication, lower extremity edema, unexplained weight gain, cough, hemoptysis or wheezing.  The patient also denies abdominal pain, nausea, vomiting, dysphagia, diarrhea, constipation, polyuria, polydipsia, dysuria, hematuria,  frequency, urgency, abnormal bleeding or bruising, fever, chills, unexpected weight changes, mood swings, change in skin or hair texture, change in voice quality, auditory or visual problems, allergic reactions or rashes, new musculoskeletal complaints other than usual "aches and pains".   PHYSICAL EXAM BP 160/64  Pulse 63  Resp 16  Ht _0  (1.702 m)  Wt 211 lb 14.4 oz (96.117 kg)  BMI 33.18 kg/m2  General: Alert, oriented x3, no distress Head: no evidence of trauma, PERRL, EOMI, no exophtalmos or lid lag, no myxedema, no xanthelasma; normal ears, nose and oropharynx Neck: Hard to see the jugular venous pulsations and no hepatojugular reflux; brisk carotid pulses without delay and no carotid bruits Chest: clear to auscultation, no signs of consolidation by percussion or palpation, normal fremitus, symmetrical and full respiratory excursions Cardiovascular: normal position and quality of the apical impulse, regular rhythm, normal first and second heart sounds, no murmurs, rubs or gallops Abdomen: no tenderness or distention, no masses by palpation, no abnormal pulsatility or arterial bruits, normal bowel sounds, no hepatosplenomegaly Extremities: no clubbing, cyanosis or edema; 2+ radial, ulnar and brachial pulses bilaterally; 2+ right femoral, 2+posterior tibial and absent dorsalis pedis pulse; 2+ left femoral, absent left posterior tibial and dorsalis pedis pulses; bilateral femoral bruits Neurological: grossly nonfocal   EKG: Normal sinus rhythm, normal tracing  Lipid Panel (after visit)    Component Value Date/Time   CHOL 110 10/20/2013 1510   TRIG 171* 10/20/2013 1510   HDL 24* 10/20/2013 1510   CHOLHDL 4.6 10/20/2013 1510   VLDL 34 10/20/2013 1510   LDLCALC 52 10/20/2013 1510    BMET (after visit)    Component Value Date/Time   NA 136 10/20/2013 1510   K 4.5 10/20/2013 1510   CL 104 10/20/2013 1510   CO2 25 10/20/2013 1510   GLUCOSE 276* 10/20/2013 1510   BUN 19 10/20/2013 1510    CREATININE 1.60* 10/20/2013 1510   CREATININE 1.20 06/11/2011 0613   CALCIUM 8.9 10/20/2013 1510   GFRNONAA 60* 06/11/2011 0613   GFRAA 69* 06/11/2011 0613     ASSESSMENT AND PLAN  The degree of asymmetry of his lower extremity edema is quite pronounced and I think we have to look again for DVT. Last year when he had less pronounced edema the study was negative. If this is again the case I think would look for causes of systemic hypervolemia such as nephrotic syndrome or congestive heart failure. He has retinopathy and is at risk for developing nephropathy. We'll check a urinalysis. He is due to have a lipid profile. Will also recheck renal function.  Addendum: The labs returned after his office visit and a show proteinuria as well as worsening renal function. Will increase his diuretic dose but need to monitor renal function closely and refer him to a nephrologist. 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 Continue ACE inhibitor therapy, but will defer the final recommendation on this to his nephrologist.  Orders Placed This Encounter  Procedures  . Comp Met (CMET)  . Lipid panel  . Urinalysis  . Lower Extremity Venous Duplex Left   Meds ordered this encounter  Medications  . furosemide (LASIX) 80 MG tablet    Sig: Take 1 tablet (80 mg total) by mouth daily.    Dispense:  30 tablet    Refill:  [place] Poppi Scantling, MD, [facility] [phone] office [phone] pager

## 2013-10-21 LAB — URINALYSIS
Bilirubin Urine: NEGATIVE
Glucose, UA: 100 mg/dL — AB
Hgb urine dipstick: NEGATIVE
Ketones, ur: NEGATIVE mg/dL
LEUKOCYTES UA: NEGATIVE
NITRITE: NEGATIVE
PH: 6 (ref 5.0–8.0)
Protein, ur: 100 mg/dL — AB
Specific Gravity, Urine: 1.014 (ref 1.005–1.030)
Urobilinogen, UA: 0.2 mg/dL (ref 0.0–1.0)

## 2013-10-21 LAB — COMPREHENSIVE METABOLIC PANEL
ALBUMIN: 3.5 g/dL (ref 3.5–5.2)
ALT: 22 U/L (ref 0–53)
AST: 21 U/L (ref 0–37)
Alkaline Phosphatase: 114 U/L (ref 39–117)
BUN: 19 mg/dL (ref 6–23)
CALCIUM: 8.9 mg/dL (ref 8.4–10.5)
CHLORIDE: 104 meq/L (ref 96–112)
CO2: 25 mEq/L (ref 19–32)
Creat: 1.6 mg/dL — ABNORMAL HIGH (ref 0.50–1.35)
Glucose, Bld: 276 mg/dL — ABNORMAL HIGH (ref 70–99)
POTASSIUM: 4.5 meq/L (ref 3.5–5.3)
SODIUM: 136 meq/L (ref 135–145)
TOTAL PROTEIN: 6.6 g/dL (ref 6.0–8.3)
Total Bilirubin: 0.4 mg/dL (ref 0.2–1.2)

## 2013-10-21 LAB — LIPID PANEL
Cholesterol: 110 mg/dL (ref 0–200)
HDL: 24 mg/dL — AB (ref >39)
LDL Cholesterol: 52 mg/dL (ref 0–99)
Total CHOL/HDL Ratio: 4.6 Ratio
Triglycerides: 171 mg/dL — ABNORMAL HIGH (ref <150)
VLDL: 34 mg/dL (ref 0–40)

## 2013-10-22 ENCOUNTER — Encounter: Payer: Self-pay | Admitting: Cardiovascular Disease

## 2013-10-25 ENCOUNTER — Telehealth (HOSPITAL_COMMUNITY): Payer: Self-pay | Admitting: *Deleted

## 2013-10-25 DIAGNOSIS — R809 Proteinuria, unspecified: Secondary | ICD-10-CM

## 2013-10-25 NOTE — Telephone Encounter (Signed)
Left message at home and on cell # with lab results and to call for results and info about a referral to nephrology.

## 2013-10-26 ENCOUNTER — Telehealth: Payer: Self-pay | Admitting: Cardiovascular Disease

## 2013-10-26 NOTE — Telephone Encounter (Signed)
Faxed referral to [facility] per Dr. Loletha Grayer referral.

## 2013-11-03 ENCOUNTER — Telehealth (HOSPITAL_COMMUNITY): Payer: Self-pay | Admitting: *Deleted

## 2013-11-04 ENCOUNTER — Telehealth: Payer: Self-pay | Admitting: Cardiovascular Disease

## 2013-11-04 NOTE — Telephone Encounter (Signed)
Per Rollene Fare she wants to give you an update.

## 2013-11-04 NOTE — Telephone Encounter (Signed)
Spoke with Zachary Hoffman, the pt is rated a 4 by the doctors at [facility]. That means he will be called in 4 months to be seen. If there is a change in labs we will need to re-fax to them and ask for re-eval for appt. Will make dr croitoru aware

## 2013-11-09 NOTE — Telephone Encounter (Signed)
Please close when readyr

## 2013-11-10 ENCOUNTER — Ambulatory Visit (HOSPITAL_COMMUNITY)
Admission: RE | Admit: 2013-11-10 | Discharge: 2013-11-10 | Disposition: A | Discharge status: Home / Self Care | Payer: Medicare Other | Source: Ambulatory Visit | Attending: Cardiovascular Disease | Admitting: Cardiovascular Disease

## 2013-11-10 DIAGNOSIS — R609 Edema, unspecified: Secondary | ICD-10-CM | POA: Diagnosis present

## 2013-11-10 NOTE — Progress Notes (Signed)
Left Lower Extremity Venous Duplex Completed. °Brianna L Mazza,RVT °

## 2013-12-01 ENCOUNTER — Ambulatory Visit (INDEPENDENT_AMBULATORY_CARE_PROVIDER_SITE_OTHER): Payer: Medicare Other | Admitting: Cardiovascular Disease

## 2013-12-01 VITALS — BP 141/73 | HR 72 | Resp 20 | Ht 67.0 in | Wt 206.6 lb

## 2013-12-01 DIAGNOSIS — I10 Essential (primary) hypertension: Secondary | ICD-10-CM

## 2013-12-01 DIAGNOSIS — G4730 Sleep apnea, unspecified: Secondary | ICD-10-CM

## 2013-12-01 DIAGNOSIS — I2510 Atherosclerotic heart disease of native coronary artery without angina pectoris: Secondary | ICD-10-CM

## 2013-12-01 DIAGNOSIS — E785 Hyperlipidemia, unspecified: Secondary | ICD-10-CM

## 2013-12-01 DIAGNOSIS — G4733 Obstructive sleep apnea (adult) (pediatric): Secondary | ICD-10-CM

## 2013-12-01 DIAGNOSIS — Z9989 Dependence on other enabling machines and devices: Secondary | ICD-10-CM

## 2013-12-01 DIAGNOSIS — I739 Peripheral vascular disease, unspecified: Secondary | ICD-10-CM

## 2013-12-01 DIAGNOSIS — Z79899 Other long term (current) drug therapy: Secondary | ICD-10-CM

## 2013-12-01 MED ORDER — FUROSEMIDE 80 MG PO TABS: 40.0000 mg | ORAL_TABLET | Freq: Every day | ORAL | Status: DC

## 2013-12-01 NOTE — Patient Instructions (Signed)
DECREASE Furosemide to 40mg  daily.    Your physician recommends that you weigh, daily, at the same time every day, and in the same amount of clothing. Please record your daily weights on the handout provided and bring it to your next appointment.  If weight increases take Furosemide to 80mg  as needed.  Your physician recommends that you return for lab work in: Prior to your next visit.  Dr. Sallyanne Kuster recommends that you schedule a follow-up appointment in: 6 months.

## 2013-12-02 ENCOUNTER — Encounter: Payer: Self-pay | Admitting: Cardiovascular Disease

## 2013-12-02 NOTE — Progress Notes (Signed)
Patient ID: KISHAUN NAVES, male   DOB: April 10, 1940, 73 y.o.   MRN: [id number]      Reason for office visit PAD, edema  Roxana Hires feels much better compared to his last office visit. He had remarkably asymmetrical edema of his lower extremities, but the lower extremity venous ultrasound did not show any evidence of thrombosis and the swelling has resolved completely with increased diuretic dose. His blood pressure is also better today. He does not have any shortness of breath.  He has an extensive history of peripheral arterial disease. In 2009 he had a left common femoral to superficial femoral artery bypass procedure for occlusive disease. He also is a history of coronary artery disease and has previously received a stent to the LAD (2004, [place], no details known). His most recent arterial Doppler study was performed in June of 2015. 1.0 and an improved left ABI of 0.87 (had been 0.68). The femoral to femoral bypass graft was widely patent with normal velocities. He has a one vessel runoff via the peroneal artery on the left. On the right side the anterior tibial artery is occluded. Normal LV EF by echo in 2008. Normal myocardial perfusion by nuclear study 2012   No Known Allergies  Current Outpatient Prescriptions  Medication Sig Dispense Refill  . amLODipine (NORVASC) 10 MG tablet Take 10 mg by mouth daily.      Marland Kitchen aspirin 81 MG tablet Take 81 mg by mouth daily.      . clopidogrel (PLAVIX) 75 MG tablet Take 75 mg by mouth daily.      . colchicine 0.6 MG tablet Take 0.6 mg by mouth daily as needed.      . cyclobenzaprine (FLEXERIL) 10 MG tablet Take 10 mg by mouth 3 (three) times daily as needed. For muscle spasms      . dexlansoprazole (DEXILANT) 60 MG capsule Take 60 mg by mouth daily.      . furosemide (LASIX) 80 MG tablet Take 0.5 tablets (40 mg total) by mouth daily.  30 tablet  6  . HYDROcodone-acetaminophen (VICODIN) 5-500 MG per tablet Take 1 tablet by mouth every 6 (six) hours as  needed. For pain  65 tablet  1  . insulin glargine (LANTUS) 100 UNIT/ML injection Inject 35 Units into the skin daily after supper.       . insulin glulisine (APIDRA) 100 UNIT/ML injection Inject 10 Units into the skin daily before supper.       . Iron-Vitamins (GERITOL PO) Take 1 capsule by mouth every morning.      . Linaclotide (LINZESS PO) Take 1 capsule by mouth daily.      . methocarbamol (ROBAXIN) 500 MG tablet Take 500 mg by mouth daily as needed. For muscle relaxer      . metoprolol succinate (TOPROL-XL) 100 MG 24 hr tablet Take 100 mg by mouth daily. Take with or immediately following a meal.      . niacin (NIASPAN) 500 MG CR tablet Take 500 mg by mouth every morning.      Marland Kitchen OMEGA-3 KRILL OIL PO Take 1 capsule by mouth every morning.      . pantoprazole (PROTONIX) 40 MG tablet Take 40 mg by mouth daily as needed. For acid reflux      . pregabalin (LYRICA) 50 MG capsule Take 50 mg by mouth daily. 2 tabs      . quinapril (ACCUPRIL) 40 MG tablet Take 40 mg by mouth at bedtime.      Marland Kitchen  simvastatin (ZOCOR) 20 MG tablet Take 20 mg by mouth every evening.      . ursodiol (ACTIGALL) 250 MG tablet Take 250 mg by mouth daily.       No current facility-administered medications for this visit.    Past Medical History  Diagnosis Date  . Hypertension   . CHF (congestive heart failure)     Echo 07/2006 with normal LV fxn, and diastolic dysfxn  . MI (myocardial infarction)     S/p stenting per patient. Last stress 04/2007 negative   . Peripheral arterial disease     S/p left femoro-femoral bypass. LE cath by Dr. Einar Gip 09/2007 without significant stenosis  . Tobacco abuse   . Diverticulosis 05/2009    Noted on screening colo   . Coronary artery disease   . Headache(784.0)   . Diabetes mellitus     On insulin   . Neuropathy   . 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 Dyslipidemia   . Pancreatitis     P/H    Past Surgical History  Procedure Laterality Date  . Replacement  total hip w/  resurfacing implants      Left  . Femoral-femoral bypass graft      In [place], on the left. Patent on cath 09/2007  . Coronary angioplasty with stent placement  2004    Mid LAD, 95% first obtuse marginal   . Doppler echocardiography  07/28/2006    Mod. LVH,EF =>55%,LA mildly dilated,mitral annular ca+,AOV mildly sclerotic,trace AI  . Nuclear stress test  09/17/2010    No ischemia  . Lower extremity angiogram  09/20/2007    no significant stenosis    Family History  Problem Relation Age of Onset  . Stroke Mother   . Diabetes Father   . Cancer Father     History   Social History  . Marital Status: Married    Spouse Name: N/A    Number of Children: N/A  . Years of Education: N/A   Occupational History  . Not on file.   Social History Main Topics  . Smoking status: Former Smoker -- 0.50 packs/day for 50 years    Types: Cigarettes    Quit date: 10/18/2012  . Smokeless tobacco: Never Used  . Alcohol Use: No  . Drug Use: No  . Sexual Activity: Not Currently   Other Topics Concern  . Not on file   Social History Narrative   Lives with wife, has grown children. Still active without cane or walker.     Review of systems: The patient specifically denies any chest pain at rest or with exertion, dyspnea at rest or with exertion, orthopnea, paroxysmal nocturnal dyspnea, syncope, palpitations, focal neurological deficits, intermittent claudication, lower extremity edema, unexplained weight gain, cough, hemoptysis or wheezing.  The patient also denies abdominal pain, nausea, vomiting, dysphagia, diarrhea, constipation, polyuria, polydipsia, dysuria, hematuria, frequency, urgency, abnormal bleeding or bruising, fever, chills, unexpected weight changes, mood swings, change in skin or hair texture, change in voice quality, auditory or visual problems, allergic reactions or rashes, new musculoskeletal complaints other than usual "aches and pains".      PHYSICAL EXAM BP  141/73  Pulse 72  Resp 20  Ht 5\' 7"  (1.702 m)  Wt 206 lb 9.6 oz (93.713 kg)  BMI 32.35 kg/m2 General: Alert, oriented x3, no distress  Head: no evidence of trauma, PERRL, EOMI, no exophtalmos or lid lag, no myxedema, no xanthelasma; normal ears, nose and oropharynx  Neck: Hard to see the jugular venous pulsations and no hepatojugular reflux; brisk carotid pulses without delay and no carotid bruits  Chest: clear to auscultation, no signs of consolidation by percussion or palpation, normal fremitus, symmetrical and full respiratory excursions  Cardiovascular: normal position and quality of the apical impulse, regular rhythm, normal first and second heart sounds, no murmurs, rubs or gallops  Abdomen: no tenderness or distention, no masses by palpation, no abnormal pulsatility or arterial bruits, normal bowel sounds, no hepatosplenomegaly  Extremities: no clubbing, cyanosis or edema; 2+ radial, ulnar and brachial pulses bilaterally; 2+ right femoral, 2+posterior tibial and absent dorsalis pedis pulse; 2+ left femoral, absent left posterior tibial and dorsalis pedis pulses; bilateral femoral bruits  Neurological: grossly nonfocal   Lipid Panel     Component Value Date/Time   CHOL 110 10/20/2013 1510   TRIG 171* 10/20/2013 1510   HDL 24* 10/20/2013 1510   CHOLHDL 4.6 10/20/2013 1510   VLDL 34 10/20/2013 1510   LDLCALC 52 10/20/2013 1510    BMET    Component Value Date/Time   NA 136 10/20/2013 1510   K 4.5 10/20/2013 1510   CL 104 10/20/2013 1510   CO2 25 10/20/2013 1510   GLUCOSE 276* 10/20/2013 1510   BUN 19 10/20/2013 1510   CREATININE 1.60* 10/20/2013 1510   CREATININE 1.20 06/11/2011 0613   CALCIUM 8.9 10/20/2013 1510   GFRNONAA 60* 06/11/2011 0613   GFRAA 69* 06/11/2011 0613     ASSESSMENT AND PLAN  I think he should reduce his dose of diuretic back to the maintenance of 40 mg daily. His creatinine has increased slightly with an crease diuretics. I have asked him to weigh himself on a daily  basis so that he will have early notice of recurrent fluid retention. If this should occur frequently, think we need to reassess his left ventricular systolic function by echocardiography.  Patient Instructions  DECREASE Furosemide to 40mg  daily.    Your physician recommends that you weigh, daily, at the same time every day, and in the same amount of clothing. Please record your daily weights on the handout provided and bring it to your next appointment.  If weight increases take Furosemide to 80mg  as needed.  Your physician recommends that you return for lab work in: Prior to your next visit.  Dr. Sallyanne Kuster recommends that you schedule a follow-up appointment in: 6 months.          Orders Placed This Encounter  Procedures  . Basic metabolic panel   Meds ordered this encounter  Medications  . furosemide (LASIX) 80 MG tablet    Sig: Take 0.5 tablets (40 mg total) by mouth daily.    Dispense:  30 tablet    Refill:  [place] Kaydence Menard, MD, [facility] [phone] office [phone] pager

## 2013-12-15 ENCOUNTER — Ambulatory Visit (HOSPITAL_COMMUNITY)
Admission: RE | Admit: 2013-12-15 | Discharge: 2013-12-15 | Disposition: A | Discharge status: Home / Self Care | Payer: Medicare Other | Source: Ambulatory Visit | Attending: Internal Medicine | Admitting: Internal Medicine

## 2013-12-15 ENCOUNTER — Other Ambulatory Visit (HOSPITAL_COMMUNITY): Payer: Self-pay | Admitting: Internal Medicine

## 2013-12-15 DIAGNOSIS — J449 Chronic obstructive pulmonary disease, unspecified: Secondary | ICD-10-CM | POA: Diagnosis present

## 2013-12-15 DIAGNOSIS — R05 Cough: Secondary | ICD-10-CM | POA: Diagnosis not present

## 2013-12-15 DIAGNOSIS — F17200 Nicotine dependence, unspecified, uncomplicated: Secondary | ICD-10-CM | POA: Insufficient documentation

## 2013-12-15 DIAGNOSIS — R059 Cough, unspecified: Secondary | ICD-10-CM | POA: Diagnosis not present

## 2013-12-15 DIAGNOSIS — J4489 Other specified chronic obstructive pulmonary disease: Secondary | ICD-10-CM | POA: Insufficient documentation

## 2014-04-09 ENCOUNTER — Emergency Department (HOSPITAL_COMMUNITY): Payer: Medicare Other

## 2014-04-09 ENCOUNTER — Emergency Department (HOSPITAL_COMMUNITY)
Admission: EM | Admit: 2014-04-09 | Discharge: 2014-04-09 | Disposition: A | Discharge status: Home / Self Care | Payer: Medicare Other | Attending: Emergency Medicine | Admitting: Emergency Medicine

## 2014-04-09 ENCOUNTER — Encounter (HOSPITAL_COMMUNITY): Payer: Self-pay | Admitting: Family Medicine

## 2014-04-09 DIAGNOSIS — Y9389 Activity, other specified: Secondary | ICD-10-CM | POA: Diagnosis not present

## 2014-04-09 DIAGNOSIS — Z7982 Long term (current) use of aspirin: Secondary | ICD-10-CM | POA: Diagnosis not present

## 2014-04-09 DIAGNOSIS — N182 Chronic kidney disease, stage 2 (mild): Secondary | ICD-10-CM | POA: Insufficient documentation

## 2014-04-09 DIAGNOSIS — I2510 Atherosclerotic heart disease of native coronary artery without angina pectoris: Secondary | ICD-10-CM | POA: Diagnosis not present

## 2014-04-09 DIAGNOSIS — Z955 Presence of coronary angioplasty implant and graft: Secondary | ICD-10-CM | POA: Insufficient documentation

## 2014-04-09 DIAGNOSIS — E119 Type 2 diabetes mellitus without complications: Secondary | ICD-10-CM | POA: Insufficient documentation

## 2014-04-09 DIAGNOSIS — I129 Hypertensive chronic kidney disease with stage 1 through stage 4 chronic kidney disease, or unspecified chronic kidney disease: Secondary | ICD-10-CM | POA: Diagnosis not present

## 2014-04-09 DIAGNOSIS — Z79899 Other long term (current) drug therapy: Secondary | ICD-10-CM | POA: Diagnosis not present

## 2014-04-09 DIAGNOSIS — Z87891 Personal history of nicotine dependence: Secondary | ICD-10-CM | POA: Diagnosis not present

## 2014-04-09 DIAGNOSIS — Y9289 Other specified places as the place of occurrence of the external cause: Secondary | ICD-10-CM | POA: Insufficient documentation

## 2014-04-09 DIAGNOSIS — E785 Hyperlipidemia, unspecified: Secondary | ICD-10-CM | POA: Diagnosis not present

## 2014-04-09 DIAGNOSIS — S161XXA Strain of muscle, fascia and tendon at neck level, initial encounter: Secondary | ICD-10-CM | POA: Insufficient documentation

## 2014-04-09 DIAGNOSIS — R52 Pain, unspecified: Secondary | ICD-10-CM

## 2014-04-09 DIAGNOSIS — Z8719 Personal history of other diseases of the digestive system: Secondary | ICD-10-CM | POA: Insufficient documentation

## 2014-04-09 DIAGNOSIS — Z794 Long term (current) use of insulin: Secondary | ICD-10-CM | POA: Diagnosis not present

## 2014-04-09 DIAGNOSIS — Y998 Other external cause status: Secondary | ICD-10-CM | POA: Insufficient documentation

## 2014-04-09 DIAGNOSIS — Z7902 Long term (current) use of antithrombotics/antiplatelets: Secondary | ICD-10-CM | POA: Diagnosis not present

## 2014-04-09 DIAGNOSIS — M542 Cervicalgia: Secondary | ICD-10-CM | POA: Diagnosis present

## 2014-04-09 DIAGNOSIS — I509 Heart failure, unspecified: Secondary | ICD-10-CM | POA: Insufficient documentation

## 2014-04-09 DIAGNOSIS — M25562 Pain in left knee: Secondary | ICD-10-CM | POA: Diagnosis not present

## 2014-04-09 DIAGNOSIS — I252 Old myocardial infarction: Secondary | ICD-10-CM | POA: Insufficient documentation

## 2014-04-09 DIAGNOSIS — X58XXXA Exposure to other specified factors, initial encounter: Secondary | ICD-10-CM | POA: Diagnosis not present

## 2014-04-09 DIAGNOSIS — G629 Polyneuropathy, unspecified: Secondary | ICD-10-CM | POA: Diagnosis not present

## 2014-04-09 MED ORDER — OXYCODONE-ACETAMINOPHEN 5-325 MG PO TABS: 1.0000 | ORAL_TABLET | Freq: Four times a day (QID) | ORAL | Status: DC | PRN

## 2014-04-09 MED ORDER — OXYCODONE-ACETAMINOPHEN 5-325 MG PO TABS
1.0000 | ORAL_TABLET | Freq: Once | ORAL | Status: AC
  Administered 2014-04-09: 1 via ORAL
  Filled 2014-04-09: qty 1

## 2014-04-09 MED ORDER — CYCLOBENZAPRINE HCL 10 MG PO TABS: 10.0000 mg | ORAL_TABLET | Freq: Two times a day (BID) | ORAL | Status: DC | PRN

## 2014-04-09 NOTE — ED Notes (Signed)
MD at bedside. Wants soft collar applied to pt.

## 2014-04-09 NOTE — ED Notes (Signed)
Pt leaving for x-ray.  ?

## 2014-04-09 NOTE — ED Provider Notes (Signed)
CSN: [id number]     Arrival date & time 04/09/14  0830 History   First MD Initiated Contact with Patient 04/09/14 [phone]     Chief Complaint  Patient presents with  . Neck Pain  . Knee Pain     (Consider location/radiation/quality/duration/timing/severity/associated sxs/prior Treatment) Patient is a 74 y.o. male presenting with neck pain and knee pain. The history is provided by the patient.  Neck Pain Pain location:  L side and R side Quality:  Aching and stiffness Stiffness is present:  All day Pain radiates to:  L shoulder and R shoulder Pain severity:  Severe Pain is:  Same all the time Onset quality:  Gradual Timing:  Constant Progression:  Worsening Chronicity:  New Context comment:  Woke up on sat morning with neck pain and worsened through the day Relieved by:  Nothing Worsened by:  Position and twisting Ineffective treatments:  Muscle relaxants Associated symptoms: no fever, no headaches, no numbness, no paresis, no tingling and no weakness   Associated symptoms comment:  Pt states also while walking today his left knee went out on him but he did not fall.  However his knee has been sore since. Risk factors: no hx of head and neck radiation, no hx of spinal trauma, no recent head injury and no recurrent falls   Knee Pain Associated symptoms: neck pain   Associated symptoms: no fever     Past Medical History  Diagnosis Date  . Hypertension   . CHF (congestive heart failure)     Echo 07/2006 with normal LV fxn, and diastolic dysfxn  . MI (myocardial infarction)     S/p stenting per patient. Last stress 04/2007 negative   . Peripheral arterial disease     S/p left femoro-femoral bypass. LE cath by Dr. Einar Gip 09/2007 without significant stenosis  . Tobacco abuse   . Diverticulosis 05/2009    Noted on screening colo   . Coronary artery disease   . Headache(784.0)   . Diabetes mellitus     On insulin   . Neuropathy   . Kidney disease, chronic, stage II (GFR 60-89  ml/min)   . Dyslipidemia   . Pancreatitis     P/H   Past Surgical History  Procedure Laterality Date  . Replacement total hip w/  resurfacing implants      Left  . Femoral-femoral bypass graft      In [place], on the left. Patent on cath 09/2007  . Coronary angioplasty with stent placement  2004    Mid LAD, 95% first obtuse marginal   . Doppler echocardiography  07/28/2006    Mod. LVH,EF =>55%,LA mildly dilated,mitral annular ca+,AOV mildly sclerotic,trace AI  . Nuclear stress test  09/17/2010    No ischemia  . Lower extremity angiogram  09/20/2007    no significant stenosis   Family History  Problem Relation Age of Onset  . Stroke Mother   . Diabetes Father   . Cancer Father    History  Substance Use Topics  . Smoking status: Former Smoker -- 0.50 packs/day for 50 years    Types: Cigarettes    Quit date: 10/18/2012  . Smokeless tobacco: Never Used  . Alcohol Use: No    Review of Systems  Constitutional: Negative for fever.  Musculoskeletal: Positive for neck pain.  Neurological: Negative for tingling, weakness, numbness and headaches.  All other systems reviewed and are negative.     Allergies  Review of patient's allergies indicates no known allergies.  Home Medications   Prior to Admission medications   Medication Sig Start Date End Date Taking? Authorizing Provider  amLODipine (NORVASC) 10 MG tablet Take 10 mg by mouth daily.   Yes Historical Provider, MD  aspirin 81 MG tablet Take 81 mg by mouth daily.   Yes Historical Provider, MD  clopidogrel (PLAVIX) 75 MG tablet Take 75 mg by mouth daily.   Yes Historical Provider, MD  colchicine 0.6 MG tablet Take 0.6 mg by mouth daily as needed (for gout flare up).    Yes Historical Provider, MD  cyclobenzaprine (FLEXERIL) 10 MG tablet Take 10 mg by mouth 3 (three) times daily as needed. For muscle spasms   Yes Historical Provider, MD  dexlansoprazole (DEXILANT) 60 MG capsule Take 60 mg by mouth daily.   Yes Historical  Provider, MD  doxazosin (CARDURA) 2 MG tablet Take 1 tablet by mouth daily. 03/08/14  Yes Historical Provider, MD  furosemide (LASIX) 80 MG tablet Take 0.5 tablets (40 mg total) by mouth daily. 12/01/13  Yes Mihai Croitoru, MD  HYDROcodone-acetaminophen (VICODIN) 5-500 MG per tablet Take 1 tablet by mouth every 6 (six) hours as needed. For pain 06/11/11  Yes Theodis Blaze, MD  insulin glargine (LANTUS) 100 UNIT/ML injection Inject 55 Units into the skin daily after supper.    Yes Historical Provider, MD  Iron-Vitamins (GERITOL PO) Take 1 capsule by mouth every morning.   Yes Historical Provider, MD  Linaclotide (LINZESS PO) Take 1 capsule by mouth daily.   Yes Historical Provider, MD  methocarbamol (ROBAXIN) 500 MG tablet Take 500 mg by mouth daily as needed. For muscle relaxer   Yes Historical Provider, MD  metoprolol succinate (TOPROL-XL) 100 MG 24 hr tablet Take 100 mg by mouth daily. Take with or immediately following a meal.   Yes Historical Provider, MD  niacin (NIASPAN) 500 MG CR tablet Take 500 mg by mouth every morning.   Yes Historical Provider, MD  OMEGA-3 KRILL OIL PO Take 1 capsule by mouth every morning.   Yes Historical Provider, MD  pantoprazole (PROTONIX) 40 MG tablet Take 40 mg by mouth daily as needed. For acid reflux   Yes Historical Provider, MD  pregabalin (LYRICA) 50 MG capsule Take 100 mg by mouth daily.    Yes Historical Provider, MD  quinapril (ACCUPRIL) 40 MG tablet Take 40 mg by mouth at bedtime.   Yes Historical Provider, MD  simvastatin (ZOCOR) 20 MG tablet Take 20 mg by mouth every evening.   Yes Historical Provider, MD  ursodiol (ACTIGALL) 250 MG tablet Take 250 mg by mouth daily.   Yes Historical Provider, MD   BP 145/58 mmHg  Pulse 71  Temp(Src) 98.3 F (36.8 C)  Resp 18  SpO2 99% Physical Exam  Constitutional: He is oriented to person, place, and time. He appears well-developed and well-nourished. No distress.  HENT:  Head: Normocephalic and atraumatic.    Eyes: EOM are normal. Pupils are equal, round, and reactive to light.  Neck: Muscular tenderness present. No spinous process tenderness present.    Cardiovascular: Normal rate.   Pulmonary/Chest: Effort normal.  Musculoskeletal:       Left knee: He exhibits normal range of motion, no swelling and no effusion. Tenderness found. Medial joint line tenderness noted.  Neurological: He is alert and oriented to person, place, and time.  Skin: Skin is warm and dry. No rash noted. No erythema.  Psychiatric: He has a normal mood and affect. His behavior is normal.  Nursing note  and vitals reviewed.   ED Course  Procedures (including critical care time) Labs Review Labs Reviewed - No data to display  Imaging Review Dg Knee Complete 4 Views Left  04/09/2014   CLINICAL DATA:  Left knee pain since this morning.  No injury.  EXAM: LEFT KNEE - COMPLETE 4+ VIEW  COMPARISON:  None.  FINDINGS: There is no evidence of fracture, dislocation, or joint effusion. There are chondrocalcinosis the femoral tibial joint. Tibial spine osteophytosis are noted. Soft tissues are unremarkable.  IMPRESSION: No acute fracture or dislocation. Arthritic changes of the left knee, differential diagnosis includes CPPD arthropathy.   Electronically Signed   By: Abelardo Diesel M.D.   On: 04/09/2014 10:03     EKG Interpretation None      MDM   Final diagnoses:  Pain    Patient with muscular type pain that started yesterday after waking up. He has no neurologic symptoms and has no cervical tenderness.  Patient has normal strength and sensation in bilateral upper and lower extremities. He tried Flexeril last night without improvement in his pain.  At this time will treat symptomatically do not feel he needs any imaging.  Secondly patient is complaining that his knee gave way today while he was walking he did not fall. Since that time he has had pain in his knee. Has prior history of surgery on the knee and states it will  occasionally go out on him. Plain film of the knee pending and patient given pain control.  10:23 AM Imaging neg for acute process.  Pt given supportive care and f/u with PcP  Blanchie Dessert, MD 04/09/14 1023

## 2014-04-09 NOTE — ED Notes (Signed)
Pt here for neck pain since Saturday. Pt has neck brace in place. sts left knee pain and gave out on him this am. Denies fall or injury.

## 2014-05-30 ENCOUNTER — Emergency Department (HOSPITAL_COMMUNITY)
Admission: EM | Admit: 2014-05-30 | Discharge: 2014-05-30 | Disposition: A | Discharge status: Home / Self Care | Payer: Medicare Other | Attending: Emergency Medicine | Admitting: Emergency Medicine

## 2014-05-30 ENCOUNTER — Encounter (HOSPITAL_COMMUNITY): Payer: Self-pay | Admitting: *Deleted

## 2014-05-30 DIAGNOSIS — G629 Polyneuropathy, unspecified: Secondary | ICD-10-CM | POA: Diagnosis not present

## 2014-05-30 DIAGNOSIS — Z7982 Long term (current) use of aspirin: Secondary | ICD-10-CM | POA: Insufficient documentation

## 2014-05-30 DIAGNOSIS — Z87891 Personal history of nicotine dependence: Secondary | ICD-10-CM | POA: Insufficient documentation

## 2014-05-30 DIAGNOSIS — N182 Chronic kidney disease, stage 2 (mild): Secondary | ICD-10-CM | POA: Diagnosis not present

## 2014-05-30 DIAGNOSIS — Z9861 Coronary angioplasty status: Secondary | ICD-10-CM | POA: Diagnosis not present

## 2014-05-30 DIAGNOSIS — E119 Type 2 diabetes mellitus without complications: Secondary | ICD-10-CM | POA: Diagnosis not present

## 2014-05-30 DIAGNOSIS — I2510 Atherosclerotic heart disease of native coronary artery without angina pectoris: Secondary | ICD-10-CM | POA: Diagnosis not present

## 2014-05-30 DIAGNOSIS — E785 Hyperlipidemia, unspecified: Secondary | ICD-10-CM | POA: Diagnosis not present

## 2014-05-30 DIAGNOSIS — K5790 Diverticulosis of intestine, part unspecified, without perforation or abscess without bleeding: Secondary | ICD-10-CM | POA: Insufficient documentation

## 2014-05-30 DIAGNOSIS — M79672 Pain in left foot: Secondary | ICD-10-CM | POA: Diagnosis present

## 2014-05-30 DIAGNOSIS — I252 Old myocardial infarction: Secondary | ICD-10-CM | POA: Insufficient documentation

## 2014-05-30 DIAGNOSIS — L03116 Cellulitis of left lower limb: Secondary | ICD-10-CM | POA: Diagnosis not present

## 2014-05-30 DIAGNOSIS — Z794 Long term (current) use of insulin: Secondary | ICD-10-CM | POA: Diagnosis not present

## 2014-05-30 DIAGNOSIS — I5030 Unspecified diastolic (congestive) heart failure: Secondary | ICD-10-CM | POA: Insufficient documentation

## 2014-05-30 DIAGNOSIS — Z79899 Other long term (current) drug therapy: Secondary | ICD-10-CM | POA: Insufficient documentation

## 2014-05-30 DIAGNOSIS — I129 Hypertensive chronic kidney disease with stage 1 through stage 4 chronic kidney disease, or unspecified chronic kidney disease: Secondary | ICD-10-CM | POA: Insufficient documentation

## 2014-05-30 DIAGNOSIS — Z7902 Long term (current) use of antithrombotics/antiplatelets: Secondary | ICD-10-CM | POA: Diagnosis not present

## 2014-05-30 DIAGNOSIS — I739 Peripheral vascular disease, unspecified: Secondary | ICD-10-CM

## 2014-05-30 DIAGNOSIS — R0989 Other specified symptoms and signs involving the circulatory and respiratory systems: Secondary | ICD-10-CM

## 2014-05-30 LAB — COMPREHENSIVE METABOLIC PANEL
ALK PHOS: 108 U/L (ref 39–117)
ALT: 22 U/L (ref 0–53)
ANION GAP: 8 (ref 5–15)
AST: 25 U/L (ref 0–37)
Albumin: 3.4 g/dL — ABNORMAL LOW (ref 3.5–5.2)
BILIRUBIN TOTAL: 0.6 mg/dL (ref 0.3–1.2)
BUN: 28 mg/dL — ABNORMAL HIGH (ref 6–23)
CHLORIDE: 108 mmol/L (ref 96–112)
CO2: 22 mmol/L (ref 19–32)
Calcium: 9.1 mg/dL (ref 8.4–10.5)
Creatinine, Ser: 1.66 mg/dL — ABNORMAL HIGH (ref 0.50–1.35)
GFR calc Af Amer: 46 mL/min — ABNORMAL LOW (ref >90)
GFR, EST NON AFRICAN AMERICAN: 40 mL/min — AB (ref >90)
GLUCOSE: 319 mg/dL — AB (ref 70–99)
POTASSIUM: 4.1 mmol/L (ref 3.5–5.1)
SODIUM: 138 mmol/L (ref 135–145)
Total Protein: 6.4 g/dL (ref 6.0–8.3)

## 2014-05-30 LAB — CBC WITH DIFFERENTIAL/PLATELET
BASOS ABS: 0 10*3/uL (ref 0.0–0.1)
BASOS PCT: 1 % (ref 0–1)
EOS ABS: 0.2 10*3/uL (ref 0.0–0.7)
Eosinophils Relative: 3 % (ref 0–5)
HCT: 35.5 % — ABNORMAL LOW (ref 39.0–52.0)
Hemoglobin: 11.6 g/dL — ABNORMAL LOW (ref 13.0–17.0)
Lymphocytes Relative: 28 % (ref 12–46)
Lymphs Abs: 1.4 10*3/uL (ref 0.7–4.0)
MCH: 26.5 pg (ref 26.0–34.0)
MCHC: 32.7 g/dL (ref 30.0–36.0)
MCV: 81.1 fL (ref 78.0–100.0)
MONOS PCT: 8 % (ref 3–12)
Monocytes Absolute: 0.4 10*3/uL (ref 0.1–1.0)
NEUTROS ABS: 3.1 10*3/uL (ref 1.7–7.7)
Neutrophils Relative %: 60 % (ref 43–77)
Platelets: 231 10*3/uL (ref 150–400)
RBC: 4.38 MIL/uL (ref 4.22–5.81)
RDW: 15.8 % — AB (ref 11.5–15.5)
WBC: 5.1 10*3/uL (ref 4.0–10.5)

## 2014-05-30 LAB — URINALYSIS, ROUTINE W REFLEX MICROSCOPIC
BILIRUBIN URINE: NEGATIVE
GLUCOSE, UA: 500 mg/dL — AB
KETONES UR: NEGATIVE mg/dL
LEUKOCYTES UA: NEGATIVE
Nitrite: NEGATIVE
PH: 6 (ref 5.0–8.0)
Protein, ur: 100 mg/dL — AB
Specific Gravity, Urine: 1.017 (ref 1.005–1.030)
Urobilinogen, UA: 0.2 mg/dL (ref 0.0–1.0)

## 2014-05-30 LAB — URINE MICROSCOPIC-ADD ON

## 2014-05-30 MED ORDER — MORPHINE SULFATE 4 MG/ML IJ SOLN
4.0000 mg | Freq: Once | INTRAMUSCULAR | Status: AC
  Administered 2014-05-30: 4 mg via INTRAVENOUS
  Filled 2014-05-30: qty 1

## 2014-05-30 MED ORDER — HYDROMORPHONE HCL 1 MG/ML IJ SOLN
1.0000 mg | Freq: Once | INTRAMUSCULAR | Status: AC
  Administered 2014-05-30: 1 mg via INTRAVENOUS
  Filled 2014-05-30: qty 1

## 2014-05-30 MED ORDER — CLINDAMYCIN HCL 300 MG PO CAPS: 300.0000 mg | ORAL_CAPSULE | Freq: Three times a day (TID) | ORAL | Status: AC

## 2014-05-30 MED ORDER — CLINDAMYCIN HCL 300 MG PO CAPS
300.0000 mg | ORAL_CAPSULE | Freq: Once | ORAL | Status: AC
  Administered 2014-05-30: 300 mg via ORAL
  Filled 2014-05-30: qty 1

## 2014-05-30 MED ORDER — OXYCODONE-ACETAMINOPHEN 5-325 MG PO TABS: 1.0000 | ORAL_TABLET | Freq: Four times a day (QID) | ORAL | Status: DC | PRN

## 2014-05-30 MED ORDER — SODIUM CHLORIDE 0.9 % IV SOLN
INTRAVENOUS | Status: DC
  Administered 2014-05-30: 10:00:00 via INTRAVENOUS

## 2014-05-30 NOTE — Progress Notes (Signed)
VASCULAR LAB PRELIMINARY  ARTERIAL  ABI completed:    RIGHT    LEFT    PRESSURE WAVEFORM  PRESSURE WAVEFORM  BRACHIAL 161 Triphasic BRACHIAL 172 Triphasic  DP 113 Triphasic DP 156 Triphasic  AT   AT    PT 189 Triphasic PT 129 Triphasic  PER   PER    GREAT TOE  NA GREAT TOE  NA    RIGHT LEFT  ABI 1.10 0.91   The right ABI is within normal limits. The left ABI is suggestive of mild arterial insufficiency, borderline within normal limits.  05/30/2014 12:57 PM Maudry Mayhew, RVT, RDCS, RDMS

## 2014-05-30 NOTE — Discharge Instructions (Signed)
As discussed, it is very important that you follow-up with your primary care physician in 2 days for a wound recheck, as well as to discuss your ongoing elevated blood sugar.  Please take all medication as directed, return here for concerning changes in your condition.   Ankle-Brachial Index Test The Ankle-Brachial index is a test used to find peripheral vascular disease (PVD). PVD is also known as peripheral arterial disease (PAD). PVD is a blocking or hardening of the arteries anywhere within the circulatory system beyond the heart. The cause is cholesterol deposits within the blood vessels (atherosclerosis). These deposits cause arteries to narrow. The delivery of oxygen to tissues is impaired as a result. This can cause muscle pain and fatigue. This is called claudication.  PVD means there may also be build up of cholesterol in the:  Heart, increasing the risk for heart attacks.  Brain, increasing the risk for strokes. This test measures the blood flow in the arms and legs. The test also determines if blood vessels are clogged by cholesterol deposits.  HOW IS THE ANKLE-BRACHIAL INDEX TEST DONE? The test is done while you are lying down and resting. The arm (brachial) and ankle systolic pressure are measured. The measurements are taken two times on both sides. Systolic pressure is the pressure within the arteries when the heart pumps. The highest systolic pressure of the ankle is then divided by the highest arm systolic pressure. The result is the ankle-brachial pressure ratio or ABI. There should be a difference of less than 10 mm Hg. Sometimes this test is repeated after five minutes of exercising on a treadmill.  You may have leg pain during the treadmill portion of the test if you suffer from PAD. If the index number drops after exercise, this may show that PAD is present. Document Released: 03/28/2004 Document Revised: 03/29/2013 Document Reviewed: 05/19/2007 [facility] Patient Information  2015 [place], [place]. This information is not intended to replace advice given to you by your health care provider. Make sure you discuss any questions you have with your health care provider.

## 2014-05-30 NOTE — ED Provider Notes (Signed)
CSN: [id number]     Arrival date & time 05/30/14  [id number] History   First MD Initiated Contact with Patient 05/30/14 [phone]     Chief Complaint  Patient presents with  . Foot Pain     (Consider location/radiation/quality/duration/timing/severity/associated sxs/prior Treatment) HPI Patient presents with new pain in the left foot. Pain began 4 days ago.  Gradual onset, notable progression of his symptoms.  Pain is severe, with associated darkening of the color. No other pain anywhere, no fever, chills, nausea, vomiting, lightheadedness. Patient also describes warmth in the foot, superficial sore proximal to the ankle. Patient has a notable history of diabetes. Patient also has history of prior left femoral-femoral bypass, in the distant past. Patient is on medication regular. Since onset of pain, no relief with anything. No clear exacerbating factors.  Past Medical History  Diagnosis Date  . Hypertension   . CHF (congestive heart failure)     Echo 07/2006 with normal LV fxn, and diastolic dysfxn  . MI (myocardial infarction)     S/p stenting per patient. Last stress 04/2007 negative   . Peripheral arterial disease     S/p left femoro-femoral bypass. LE cath by Dr. Einar Gip 09/2007 without significant stenosis  . Tobacco abuse   . Diverticulosis 05/2009    Noted on screening colo   . Coronary artery disease   . Headache(784.0)   . Diabetes mellitus     On insulin   . Neuropathy   . 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 Dyslipidemia   . Pancreatitis     P/H   Past Surgical History  Procedure Laterality Date  . Replacement total hip w/  resurfacing implants      Left  . Femoral-femoral bypass graft      In [place], on the left. Patent on cath 09/2007  . Coronary angioplasty with stent placement  2004    Mid LAD, 95% first obtuse marginal   . Doppler echocardiography  07/28/2006    Mod. LVH,EF =>55%,LA mildly dilated,mitral annular ca+,AOV mildly sclerotic,trace AI   . Nuclear stress test  09/17/2010    No ischemia  . Lower extremity angiogram  09/20/2007    no significant stenosis   Family History  Problem Relation Age of Onset  . Stroke Mother   . Diabetes Father   . Cancer Father    History  Substance Use Topics  . Smoking status: Former Smoker -- 0.50 packs/day for 50 years    Types: Cigarettes    Quit date: 10/18/2012  . Smokeless tobacco: Never Used  . Alcohol Use: No    Review of Systems  Constitutional:       Per HPI, otherwise negative  HENT:       Per HPI, otherwise negative  Respiratory:       Per HPI, otherwise negative  Cardiovascular:       Per HPI, otherwise negative  Gastrointestinal: Negative for nausea and vomiting.  Endocrine:       Negative aside from HPI  Genitourinary:       Neg aside from HPI   Musculoskeletal:       Per HPI, otherwise negative  Skin: Positive for color change and wound.  Allergic/Immunologic: Negative for immunocompromised state.  Neurological: Negative for syncope.      Allergies  Review of patient's allergies indicates no known allergies.  Home Medications   Prior to Admission medications   Medication Sig Start Date End Date Taking? Authorizing Provider  albuterol (PROVENTIL HFA;VENTOLIN HFA) 108 (90 BASE) MCG/ACT inhaler Inhale 2 puffs into the lungs every 6 (six) hours as needed for wheezing or shortness of breath.   Yes Historical Provider, MD  amLODipine (NORVASC) 10 MG tablet Take 10 mg by mouth daily.   Yes Historical Provider, MD  aspirin 81 MG tablet Take 81 mg by mouth daily.   Yes Historical Provider, MD  bismuth subsalicylate (PEPTO BISMOL) 262 MG/15ML suspension Take 30 mLs by mouth every 6 (six) hours as needed (heartburn).   Yes Historical Provider, MD  clopidogrel (PLAVIX) 75 MG tablet Take 75 mg by mouth daily.   Yes Historical Provider, MD  doxazosin (CARDURA) 2 MG tablet Take 2 mg by mouth daily.  03/08/14  Yes Historical Provider, MD  furosemide (LASIX) 80 MG  tablet Take 0.5 tablets (40 mg total) by mouth daily. Patient taking differently: Take 40 mg by mouth daily as needed.  12/01/13  Yes Mihai Croitoru, MD  ibuprofen (ADVIL,MOTRIN) 200 MG tablet Take 400 mg by mouth every 6 (six) hours as needed for moderate pain.   Yes Historical Provider, MD  insulin glargine (LANTUS) 100 UNIT/ML injection Inject 55 Units into the skin at bedtime.    Yes Historical Provider, MD  Iron-Vitamins (GERITOL PO) Take 1 capsule by mouth every morning.   Yes Historical Provider, MD  metoprolol succinate (TOPROL-XL) 100 MG 24 hr tablet Take 100 mg by mouth daily. Take with or immediately following a meal.   Yes Historical Provider, MD  OMEGA-3 KRILL OIL PO Take 1 capsule by mouth every morning.   Yes Historical Provider, MD  pantoprazole (PROTONIX) 40 MG tablet Take 40 mg by mouth daily as needed. For acid reflux   Yes Historical Provider, MD  pregabalin (LYRICA) 50 MG capsule Take 100 mg by mouth 2 (two) times daily.    Yes Historical Provider, MD  PRESCRIPTION MEDICATION Inject 10 Units into the skin daily before supper.   Yes Historical Provider, MD  PRESCRIPTION MEDICATION Apply 1 application topically 2 (two) times daily. Apply twice daily to blisters on legs.   Yes Historical Provider, MD  quinapril (ACCUPRIL) 40 MG tablet Take 40 mg by mouth daily.    Yes Historical Provider, MD  clindamycin (CLEOCIN) 300 MG capsule Take 1 capsule (300 mg total) by mouth 3 (three) times daily. 05/30/14 06/06/14  Carmin Muskrat, MD  colchicine 0.6 MG tablet Take 0.6 mg by mouth daily as needed (for gout flare up).     Historical Provider, MD  cyclobenzaprine (FLEXERIL) 10 MG tablet Take 1 tablet (10 mg total) by mouth 2 (two) times daily as needed for muscle spasms. 04/09/14   Blanchie Dessert, MD  dexlansoprazole (DEXILANT) 60 MG capsule Take 60 mg by mouth daily.    Historical Provider, MD  Linaclotide (LINZESS PO) Take 1 capsule by mouth daily.    Historical Provider, MD  methocarbamol  (ROBAXIN) 500 MG tablet Take 500 mg by mouth daily as needed. For muscle relaxer    Historical Provider, MD  niacin (NIASPAN) 500 MG CR tablet Take 500 mg by mouth every morning.    Historical Provider, MD  oxyCODONE-acetaminophen (PERCOCET/ROXICET) 5-325 MG per tablet Take 1-2 tablets by mouth every 6 (six) hours as needed for severe pain. 05/30/14   Carmin Muskrat, MD  simvastatin (ZOCOR) 20 MG tablet Take 20 mg by mouth every evening.    Historical Provider, MD  ursodiol (ACTIGALL) 250 MG tablet Take 250 mg by mouth daily.    Historical Provider,  MD   BP 132/54 mmHg  Pulse 53  Temp(Src) 97.9 F (36.6 C) (Oral)  Resp 14  Ht 5\' 7"  (1.702 m)  Wt 197 lb (89.359 kg)  BMI 30.85 kg/m2  SpO2 98% Physical Exam  Constitutional: He is oriented to person, place, and time. He appears well-developed. No distress.  HENT:  Head: Normocephalic and atraumatic.  Eyes: Conjunctivae and EOM are normal.  Cardiovascular: Normal rate and regular rhythm.   Pulmonary/Chest: Effort normal. No stridor. No respiratory distress.  Abdominal: He exhibits no distension.  Musculoskeletal: He exhibits no edema.       Left knee: Normal.       Right ankle: Normal.       Feet:  Neurological: He is alert and oriented to person, place, and time.  Skin: Skin is warm and dry.  Psychiatric: He has a normal mood and affect.  Nursing note and vitals reviewed.   ED Course  Procedures (including critical care time) Labs Review Labs Reviewed  COMPREHENSIVE METABOLIC PANEL - Abnormal; Notable for the following:    Glucose, Bld 319 (*)    BUN 28 (*)    Creatinine, Ser 1.66 (*)    Albumin 3.4 (*)    GFR calc non Af Amer 40 (*)    GFR calc Af Amer 46 (*)    All other components within normal limits  CBC WITH DIFFERENTIAL/PLATELET - Abnormal; Notable for the following:    Hemoglobin 11.6 (*)    HCT 35.5 (*)    RDW 15.8 (*)    All other components within normal limits  URINALYSIS, ROUTINE W REFLEX MICROSCOPIC -  Abnormal; Notable for the following:    Glucose, UA 500 (*)    Hgb urine dipstick TRACE (*)    Protein, ur 100 (*)    All other components within normal limits  URINE MICROSCOPIC-ADD ON    Imaging Review I discussed the ankle brachial index study with our ultrasonographer. Patient had essentially normal levels. On repeat exam after that study the patient was in no distress, we discussed all findings per    MDM   Final diagnoses:  Cellulitis of left lower extremity    Patient presents with ongoing left foot pain patient's history of vascular surgery, as well as slightly diminished pulses required evaluation with ankle-brachial index.  This study was reassuring.  Patient does have some clinical findings concerning for cellulitis, and given that he is diabetic, was started on antibiotics. No other evidence for bacteremia, sepsis, neurovascular compromise.  Patient had no other complaints, was appropriate for discharge with further evaluation, management as an outpatient.  Carmin Muskrat, MD 05/30/14 [phone]

## 2014-05-30 NOTE — ED Notes (Signed)
Patient reports onset of pain in the left foot on Friday.  He noticed darker coloring today.  Patient has hx of diabetes.  He has hx of vascular surgery in the same leg.  Patient is tender to any palpation.  The foot is warm.  Cap refill less than 3 seconds.  No open sore noted.  He does have a sore on the mid shin area.  Patient denies any trauma.  He also reports he has gout.  He is seen by Dr Jeanie Cooks.  Vascular surgery was performed in [place]

## 2014-05-30 NOTE — ED Notes (Signed)
Pt returned from vascular

## 2014-05-30 NOTE — ED Notes (Addendum)
Call to doppler, patient to be called for soon for ankle brachial index.

## 2014-06-05 ENCOUNTER — Ambulatory Visit (INDEPENDENT_AMBULATORY_CARE_PROVIDER_SITE_OTHER): Payer: Medicare Other | Admitting: Cardiovascular Disease

## 2014-06-05 VITALS — BP 122/62 | HR 63 | Resp 16 | Ht 67.0 in | Wt 209.7 lb

## 2014-06-05 DIAGNOSIS — G4733 Obstructive sleep apnea (adult) (pediatric): Secondary | ICD-10-CM

## 2014-06-05 DIAGNOSIS — Z9989 Dependence on other enabling machines and devices: Secondary | ICD-10-CM

## 2014-06-05 DIAGNOSIS — I739 Peripheral vascular disease, unspecified: Secondary | ICD-10-CM

## 2014-06-05 DIAGNOSIS — E669 Obesity, unspecified: Secondary | ICD-10-CM

## 2014-06-05 DIAGNOSIS — I10 Essential (primary) hypertension: Secondary | ICD-10-CM

## 2014-06-05 DIAGNOSIS — I2510 Atherosclerotic heart disease of native coronary artery without angina pectoris: Secondary | ICD-10-CM

## 2014-06-05 DIAGNOSIS — Z720 Tobacco use: Secondary | ICD-10-CM

## 2014-06-05 NOTE — Patient Instructions (Addendum)
Your physician has requested that you have a lower extremity arterial duplex in July 2016. This test is an ultrasound of the arteries in the legs or arms. It looks at arterial blood flow in the legs and arms. Allow one hour for Lower and Upper Arterial scans. There are no restrictions or special instructions.  Your physician discussed the hazards of tobacco use. Tobacco use cessation is recommended and techniques and options to help you quit were discussed.   Dr. Sallyanne Kuster recommends that you schedule a follow-up appointment in: AUGUST 2016.

## 2014-06-06 ENCOUNTER — Encounter: Payer: Self-pay | Admitting: Cardiovascular Disease

## 2014-06-06 DIAGNOSIS — Z720 Tobacco use: Secondary | ICD-10-CM | POA: Insufficient documentation

## 2014-06-06 NOTE — Progress Notes (Signed)
Patient ID: Zachary Hoffman, male   DOB: 11/12/1940, 74 y.o.   MRN: [id number]     Cardiology Office Note   Date:  06/06/2014   ID:  Zachary Hoffman, DOB 10-15-1940, MRN [id number]  PCP:  Philis Fendt, MD  Cardiologist:   Sanda Klein, MD   Chief Complaint  Patient presents with  . Follow-up    6 months follow-up, pt was in ER for infection of his left toes.      History of Present Illness: Zachary Hoffman is a 74 y.o. male who presents for follow-up after an emergency room visit with suspected soft tissue infection of the left foot.  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 Unfortunately he continues to smoke half a pack of cigarettes a day. He had severe pain and discoloration in the ball of the left foot, subsequently resolved. Ankle-brachial indices checked as below, similar to results on previous lower extremity arterial duplex ultrasonography. Last assessment of left ventricle systolic function was nuclear stress test with normal perfusion and ejection fraction of 64% in June 2012  He has an extensive history of peripheral arterial disease. In 2009 he had a left common femoral to superficial femoral artery bypass procedure for occlusive disease. He also is a history of coronary artery disease and has previously received a stent to the LAD (2004, [place], no details known). His most recent arterial Doppler study was performed in June of 2015. 1.0 and an improved left ABI of 0.87 (had been 0.68). The femoral to femoral bypass graft was widely patent with normal velocities. He has a one vessel runoff via the peroneal artery on  the left. On the right side the anterior tibial artery is occluded. Normal LV EF by echo in 2008. Normal myocardial perfusion by nuclear study 2012.  +-----------------+---------+--------------+---------+ Location     Pressure  Brachial indexWaveform  +-----------------+---------+--------------+---------+ Right ant tibial 113 mm Hg 0.66     Triphasic +-----------------+---------+--------------+---------+ Right post tibial189 mm Hg 1.1      Triphasic +-----------------+---------+--------------+---------+ Left ant tibial 156 mm Hg 0.91     Triphasic +-----------------+---------+--------------+---------+ Left post tibial 129 mm Hg 0.75     Triphasic +-----------------+---------+--------------+---------+  Past Medical History  Diagnosis Date  . Hypertension   . CHF (congestive heart failure)     Echo 07/2006 with normal LV fxn, and diastolic dysfxn  . MI (myocardial infarction)     S/p stenting per patient. Last stress 04/2007 negative   . Peripheral arterial disease     S/p left femoro-femoral bypass. LE cath by Dr. Einar Gip 09/2007 without significant stenosis  . Tobacco abuse   . Diverticulosis 05/2009    Noted on screening colo   . Coronary artery disease   . Headache(784.0)   . Diabetes mellitus     On insulin   . Neuropathy   . 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 Dyslipidemia   . Pancreatitis     P/H    Past Surgical History  Procedure Laterality Date  . Replacement total hip w/  resurfacing implants      Left  . Femoral-femoral bypass graft      In  [place], on the left. Patent on cath 09/2007  . Coronary angioplasty with stent placement  2004    Mid LAD, 95% first obtuse marginal   . Doppler echocardiography  07/28/2006    Mod. LVH,EF =>55%,LA mildly dilated,mitral annular ca+,AOV mildly sclerotic,trace AI  . Nuclear stress test  09/17/2010    No ischemia  . Lower extremity angiogram  09/20/2007    no  significant stenosis     Current Outpatient Prescriptions  Medication Sig Dispense Refill  . albuterol (PROVENTIL HFA;VENTOLIN HFA) 108 (90 BASE) MCG/ACT inhaler Inhale 2 puffs into the lungs every 6 (six) hours as needed for wheezing or shortness of breath.    Marland Kitchen amLODipine (NORVASC) 10 MG tablet Take 10 mg by mouth daily.    Marland Kitchen aspirin 81 MG tablet Take 81 mg by mouth daily.    Marland Kitchen bismuth subsalicylate (PEPTO BISMOL) 262 MG/15ML suspension Take 30 mLs by mouth every 6 (six) hours as needed (heartburn).    . clindamycin (CLEOCIN) 300 MG capsule Take 1 capsule (300 mg total) by mouth 3 (three) times daily. 42 capsule 0  . clopidogrel (PLAVIX) 75 MG tablet Take 75 mg by mouth daily.    . colchicine 0.6 MG tablet Take 0.6 mg by mouth daily as needed (for gout flare up).     . cyclobenzaprine (FLEXERIL) 10 MG tablet Take 1 tablet (10 mg total) by mouth 2 (two) times daily as needed for muscle spasms. 20 tablet 0  . dexlansoprazole (DEXILANT) 60 MG capsule Take 60 mg by mouth daily.    Marland Kitchen doxazosin (CARDURA) 2 MG tablet Take 2 mg by mouth daily.   0  . furosemide (LASIX) 80 MG tablet Take 0.5 tablets (40 mg total) by mouth daily. (Patient taking differently: Take 40 mg by mouth daily as needed. ) 30 tablet 6  . ibuprofen (ADVIL,MOTRIN) 200 MG tablet Take 400 mg by mouth every 6 (six) hours as needed for moderate pain.    Marland Kitchen insulin glargine (LANTUS) 100 UNIT/ML injection Inject 55 Units into the skin at bedtime.     . Iron-Vitamins (GERITOL PO) Take 1 capsule by mouth every morning.    . Linaclotide (LINZESS PO) Take 1 capsule by mouth daily.    . methocarbamol (ROBAXIN) 500 MG tablet Take 500 mg by mouth daily as needed. For muscle relaxer    . metoprolol succinate (TOPROL-XL) 100 MG 24 hr tablet Take 100 mg by mouth daily. Take with or immediately following a meal.    . niacin (NIASPAN) 500 MG CR tablet Take 500 mg by mouth every morning.    Marland Kitchen OMEGA-3 KRILL OIL PO Take 1 capsule by mouth every  morning.    Marland Kitchen oxyCODONE-acetaminophen (PERCOCET/ROXICET) 5-325 MG per tablet Take 1-2 tablets by mouth every 6 (six) hours as needed for severe pain. 15 tablet 0  . pantoprazole (PROTONIX) 40 MG tablet Take 40 mg by mouth daily as needed. For acid reflux    . pregabalin (LYRICA) 50 MG capsule Take 100 mg by mouth 2 (two) times daily.     Marland Kitchen PRESCRIPTION MEDICATION Inject 10 Units into the skin daily before supper.    Marland Kitchen PRESCRIPTION MEDICATION Apply 1 application topically 2 (two) times daily. Apply twice daily to blisters on legs.    . quinapril (ACCUPRIL) 40 MG tablet Take 40 mg by mouth daily.     . simvastatin (ZOCOR) 20 MG tablet Take 20 mg by mouth every evening.    . ursodiol (ACTIGALL) 250 MG  tablet Take 250 mg by mouth daily.     No current facility-administered medications for this visit.    Allergies:   Review of patient's allergies indicates no known allergies.    Social History:  The patient  reports that he quit smoking about 19 months ago. His smoking use included Cigarettes. He has a 25 pack-year smoking history. He has never used smokeless tobacco. He reports that he does not drink alcohol or use illicit drugs.   Family History:  The patient's family history includes Cancer in his father; Diabetes in his father; Stroke in his mother.    ROS:  Please see the history of present illness.   Otherwise, review of systems are positive for none.   All other systems are reviewed and negative.  The patient specifically denies any chest pain at rest or with exertion, dyspnea at rest or with exertion, orthopnea, paroxysmal nocturnal dyspnea, syncope, palpitations, focal neurological deficits, intermittent claudication, lower extremity edema, unexplained weight gain, cough, hemoptysis or wheezing.  The patient also denies abdominal pain, nausea, vomiting, dysphagia, diarrhea, constipation, polyuria, polydipsia, dysuria, hematuria, frequency, urgency, abnormal bleeding or bruising, fever,  chills, unexpected weight changes, mood swings, change in skin or hair texture, change in voice quality, auditory or visual problems, allergic reactions or rashes, new musculoskeletal complaints other than usual "aches and pains".     PHYSICAL EXAM: VS:  BP 122/62 mmHg  Pulse 63  Resp 16  Ht 5\' 7"  (1.702 m)  Wt 209 lb 11.2 oz (95.119 kg)  BMI 32.84 kg/m2 , BMI Body mass index is 32.84 kg/(m^2). General: Alert, oriented x3, no distress  Head: no evidence of trauma, PERRL, EOMI, no exophtalmos or lid lag, no myxedema, no xanthelasma; normal ears, nose and oropharynx  Neck: Hard to see the jugular venous pulsations and no hepatojugular reflux; brisk carotid pulses without delay and no carotid bruits  Chest: clear to auscultation, no signs of consolidation by percussion or palpation, normal fremitus, symmetrical and full respiratory excursions  Cardiovascular: normal position and quality of the apical impulse, regular rhythm, normal first and second heart sounds, no murmurs, rubs or gallops  Abdomen: no tenderness or distention, no masses by palpation, no abnormal pulsatility or arterial bruits, normal bowel sounds, no hepatosplenomegaly  Extremities: no clubbing, cyanosis or edema; 2+ radial, ulnar and brachial pulses bilaterally; 2+ right femoral, 2+posterior tibial and absent dorsalis pedis pulse; 2+ left femoral, absent left posterior tibial and dorsalis pedis pulses; bilateral femoral bruits . The foot appears healthy with excellent capillary refill no discoloration and no tenderness Neurological: grossly nonfocal   EKG:  EKG is ordered today. The ekg ordered today demonstrates NSR, normal tracing   Recent Labs: 05/30/2014: ALT 22; BUN 28*; Creatinine 1.66*; Hemoglobin 11.6*; Platelets 231; Potassium 4.1; Sodium 138    Lipid Panel    Component Value Date/Time   CHOL 110 10/20/2013 1510   TRIG 171* 10/20/2013 1510   HDL 24* 10/20/2013 1510   CHOLHDL 4.6 10/20/2013 1510    VLDL 34 10/20/2013 1510   LDLCALC 52 10/20/2013 1510      Wt Readings from Last 3 Encounters:  06/05/14 209 lb 11.2 oz (95.119 kg)  05/30/14 197 lb (89.359 kg)  12/01/13 206 lb 9.6 oz (93.713 kg)      Other studies Reviewed: Additional studies/ records that were reviewed today include: Previous lower extremity arterial Doppler June 2015 Review of the above records demonstrates: Stable findings compared to older tests and similar to current ABI  ASSESSMENT AND PLAN:  1.  Peripheral arterial disease - recommend repeat evaluation in July, one year from his previous duplex ultrasound; at this point there appears to be no ischemic threats to his limb, but this is a big concern with diabetes and ongoing tobacco use  2. Coronary artery disease - asymptomatic; satisfactory lipid profile, expect triglycerides and HDL to improve with weight loss and satisfactory glycemic control; again biggest problem is smoking cessation  3. Hypertension - good control  4. Tobacco abuse - spent well over 15 minutes discussing strategies to achieve complete and lasting smoking cessation  Patient Instructions  Your physician has requested that you have a lower extremity arterial duplex in July 2016. This test is an ultrasound of the arteries in the legs or arms. It looks at arterial blood flow in the legs and arms. Allow one hour for Lower and Upper Arterial scans. There are no restrictions or special instructions.  Your physician discussed the hazards of tobacco use. Tobacco use cessation is recommended and techniques and options to help you quit were discussed.   Dr. Sallyanne Kuster recommends that you schedule a follow-up appointment in: AUGUST 2016.       Current medicines are reviewed at length with the patient today.  The patient does not have concerns regarding medicines.  The following changes have been made:  no change  Labs/ tests ordered today include:  Orders Placed This Encounter  Procedures   . EKG 12-Lead  . Lower Extremity Arterial Duplex     Disposition:   FU with me in 6 months   Mikael Spray, MD  06/06/2014 10:58 AM    [facility] [place], [place], [place]  [address] Phone: [phone]; Fax: [phone]

## 2014-09-04 ENCOUNTER — Emergency Department (HOSPITAL_COMMUNITY)
Admission: EM | Admit: 2014-09-04 | Discharge: 2014-09-04 | Disposition: A | Discharge status: Home / Self Care | Payer: Medicare Other | Attending: Emergency Medicine | Admitting: Emergency Medicine

## 2014-09-04 ENCOUNTER — Encounter (HOSPITAL_COMMUNITY): Payer: Self-pay | Admitting: Nurse Practitioner

## 2014-09-04 DIAGNOSIS — I509 Heart failure, unspecified: Secondary | ICD-10-CM | POA: Insufficient documentation

## 2014-09-04 DIAGNOSIS — Z87891 Personal history of nicotine dependence: Secondary | ICD-10-CM | POA: Insufficient documentation

## 2014-09-04 DIAGNOSIS — N182 Chronic kidney disease, stage 2 (mild): Secondary | ICD-10-CM | POA: Insufficient documentation

## 2014-09-04 DIAGNOSIS — G629 Polyneuropathy, unspecified: Secondary | ICD-10-CM | POA: Insufficient documentation

## 2014-09-04 DIAGNOSIS — L03114 Cellulitis of left upper limb: Secondary | ICD-10-CM | POA: Insufficient documentation

## 2014-09-04 DIAGNOSIS — Z79899 Other long term (current) drug therapy: Secondary | ICD-10-CM | POA: Insufficient documentation

## 2014-09-04 DIAGNOSIS — Z951 Presence of aortocoronary bypass graft: Secondary | ICD-10-CM | POA: Insufficient documentation

## 2014-09-04 DIAGNOSIS — I129 Hypertensive chronic kidney disease with stage 1 through stage 4 chronic kidney disease, or unspecified chronic kidney disease: Secondary | ICD-10-CM | POA: Diagnosis not present

## 2014-09-04 DIAGNOSIS — Z7982 Long term (current) use of aspirin: Secondary | ICD-10-CM | POA: Insufficient documentation

## 2014-09-04 DIAGNOSIS — I2510 Atherosclerotic heart disease of native coronary artery without angina pectoris: Secondary | ICD-10-CM | POA: Insufficient documentation

## 2014-09-04 DIAGNOSIS — I252 Old myocardial infarction: Secondary | ICD-10-CM | POA: Diagnosis not present

## 2014-09-04 DIAGNOSIS — Z8719 Personal history of other diseases of the digestive system: Secondary | ICD-10-CM | POA: Diagnosis not present

## 2014-09-04 DIAGNOSIS — E785 Hyperlipidemia, unspecified: Secondary | ICD-10-CM | POA: Insufficient documentation

## 2014-09-04 DIAGNOSIS — Z794 Long term (current) use of insulin: Secondary | ICD-10-CM | POA: Diagnosis not present

## 2014-09-04 DIAGNOSIS — Z7902 Long term (current) use of antithrombotics/antiplatelets: Secondary | ICD-10-CM | POA: Insufficient documentation

## 2014-09-04 MED ORDER — DOXYCYCLINE HYCLATE 100 MG PO CAPS: 100.0000 mg | ORAL_CAPSULE | Freq: Two times a day (BID) | ORAL | Status: DC

## 2014-09-04 NOTE — Discharge Instructions (Signed)
Return here at once for fever, increased redness or swelling to your arm, or any other problems Cellulitis Cellulitis is an infection of the skin and the tissue beneath it. The infected area is usually red and tender. Cellulitis occurs most often in the arms and lower legs.  CAUSES  Cellulitis is caused by bacteria that enter the skin through cracks or cuts in the skin. The most common types of bacteria that cause cellulitis are staphylococci and streptococci. SIGNS AND SYMPTOMS   Redness and warmth.  Swelling.  Tenderness or pain.  Fever. DIAGNOSIS  Your health care provider can usually determine what is wrong based on a physical exam. Blood tests may also be done. TREATMENT  Treatment usually involves taking an antibiotic medicine. HOME CARE INSTRUCTIONS   Take your antibiotic medicine as directed by your health care provider. Finish the antibiotic even if you start to feel better.  Keep the infected arm or leg elevated to reduce swelling.  Apply a warm cloth to the affected area up to 4 times per day to relieve pain.  Take medicines only as directed by your health care provider.  Keep all follow-up visits as directed by your health care provider. SEEK MEDICAL CARE IF:   You notice red streaks coming from the infected area.  Your red area gets larger or turns dark in color.  Your bone or joint underneath the infected area becomes painful after the skin has healed.  Your infection returns in the same area or another area.  You notice a swollen bump in the infected area.  You develop new symptoms.  You have a fever. SEEK IMMEDIATE MEDICAL CARE IF:   You feel very sleepy.  You develop vomiting or diarrhea.  You have a general ill feeling (malaise) with muscle aches and pains. MAKE SURE YOU:   Understand these instructions.  Will watch your condition.  Will get help right away if you are not doing well or get worse. Document Released: 01/01/2005 Document  Revised: 08/08/2013 Document Reviewed: 06/09/2011 [facility] Patient Information 2015 [place], [place]. This information is not intended to replace advice given to you by your health care provider. Make sure you discuss any questions you have with your health care provider.

## 2014-09-04 NOTE — ED Notes (Signed)
He thought he had a bug bite to L upper arm on Saturday, but the site has become increasingly swollen, painful and red since. Open scabbed area surrounded by redness is noted. He has been cleaning with peroxide and alcohol

## 2014-09-04 NOTE — ED Notes (Signed)
Pt. Left with all belongings and refused wheelchair 

## 2014-09-04 NOTE — ED Provider Notes (Signed)
CSN: [id number]     Arrival date & time 09/04/14  1731 History   First MD Initiated Contact with Patient 09/04/14 1930     Chief Complaint  Patient presents with  . Cellulitis     (Consider location/radiation/quality/duration/timing/severity/associated sxs/prior Treatment) HPI Comments: Patient here complaining of erythema and swelling to his left upper extremity. Denies any fever or chills. No vomiting noted. History of similar symptoms in the past. Has been using alcohol and peroxide without relief. Does note pruritus but denies any drainage. Nothing makes the symptoms better or worse.  The history is provided by the patient.    Past Medical History  Diagnosis Date  . Hypertension   . CHF (congestive heart failure)     Echo 07/2006 with normal LV fxn, and diastolic dysfxn  . MI (myocardial infarction)     S/p stenting per patient. Last stress 04/2007 negative   . Peripheral arterial disease     S/p left femoro-femoral bypass. LE cath by Dr. Einar Gip 09/2007 without significant stenosis  . Tobacco abuse   . Diverticulosis 05/2009    Noted on screening colo   . Coronary artery disease   . Headache(784.0)   . Diabetes mellitus     On insulin   . Neuropathy   . 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 Dyslipidemia   . Pancreatitis     P/H   Past Surgical History  Procedure Laterality Date  . Replacement total hip w/  resurfacing implants      Left  . Femoral-femoral bypass graft      In [place], on the left. Patent on cath 09/2007  . Coronary angioplasty with stent placement  2004    Mid LAD, 95% first obtuse marginal   . Doppler echocardiography  07/28/2006    Mod. LVH,EF =>55%,LA mildly dilated,mitral annular ca+,AOV mildly sclerotic,trace AI  . Nuclear stress test  09/17/2010    No ischemia  . Lower extremity angiogram  09/20/2007    no significant stenosis   Family History  Problem Relation Age of Onset  . Stroke Mother   . Diabetes Father   . Cancer  Father    History  Substance Use Topics  . Smoking status: Former Smoker -- 0.50 packs/day for 50 years    Types: Cigarettes    Quit date: 10/18/2012  . Smokeless tobacco: Never Used  . Alcohol Use: No    Review of Systems  All other systems reviewed and are negative.     Allergies  Review of patient's allergies indicates no known allergies.  Home Medications   Prior to Admission medications   Medication Sig Start Date End Date Taking? Authorizing Provider  albuterol (PROVENTIL HFA;VENTOLIN HFA) 108 (90 BASE) MCG/ACT inhaler Inhale 2 puffs into the lungs every 6 (six) hours as needed for wheezing or shortness of breath.    Historical Provider, MD  amLODipine (NORVASC) 10 MG tablet Take 10 mg by mouth daily.    Historical Provider, MD  aspirin 81 MG tablet Take 81 mg by mouth daily.    Historical Provider, MD  bismuth subsalicylate (PEPTO BISMOL) 262 MG/15ML suspension Take 30 mLs by mouth every 6 (six) hours as needed (heartburn).    Historical Provider, MD  clopidogrel (PLAVIX) 75 MG tablet Take 75 mg by mouth daily.    Historical Provider, MD  colchicine 0.6 MG tablet Take 0.6 mg by mouth daily as needed (for gout flare up).     Historical Provider, MD  cyclobenzaprine (FLEXERIL) 10 MG tablet Take 1 tablet (10 mg total) by mouth 2 (two) times daily as needed for muscle spasms. 04/09/14   Blanchie Dessert, MD  dexlansoprazole (DEXILANT) 60 MG capsule Take 60 mg by mouth daily.    Historical Provider, MD  doxazosin (CARDURA) 2 MG tablet Take 2 mg by mouth daily.  03/08/14   Historical Provider, MD  furosemide (LASIX) 80 MG tablet Take 0.5 tablets (40 mg total) by mouth daily. Patient taking differently: Take 40 mg by mouth daily as needed.  12/01/13   Mihai Croitoru, MD  ibuprofen (ADVIL,MOTRIN) 200 MG tablet Take 400 mg by mouth every 6 (six) hours as needed for moderate pain.    Historical Provider, MD  insulin glargine (LANTUS) 100 UNIT/ML injection Inject 55 Units into the skin at  bedtime.     Historical Provider, MD  Iron-Vitamins (GERITOL PO) Take 1 capsule by mouth every morning.    Historical Provider, MD  Linaclotide (LINZESS PO) Take 1 capsule by mouth daily.    Historical Provider, MD  methocarbamol (ROBAXIN) 500 MG tablet Take 500 mg by mouth daily as needed. For muscle relaxer    Historical Provider, MD  metoprolol succinate (TOPROL-XL) 100 MG 24 hr tablet Take 100 mg by mouth daily. Take with or immediately following a meal.    Historical Provider, MD  niacin (NIASPAN) 500 MG CR tablet Take 500 mg by mouth every morning.    Historical Provider, MD  OMEGA-3 KRILL OIL PO Take 1 capsule by mouth every morning.    Historical Provider, MD  oxyCODONE-acetaminophen (PERCOCET/ROXICET) 5-325 MG per tablet Take 1-2 tablets by mouth every 6 (six) hours as needed for severe pain. 05/30/14   Carmin Muskrat, MD  pantoprazole (PROTONIX) 40 MG tablet Take 40 mg by mouth daily as needed. For acid reflux    Historical Provider, MD  pregabalin (LYRICA) 50 MG capsule Take 100 mg by mouth 2 (two) times daily.     Historical Provider, MD  PRESCRIPTION MEDICATION Inject 10 Units into the skin daily before supper.    Historical Provider, MD  PRESCRIPTION MEDICATION Apply 1 application topically 2 (two) times daily. Apply twice daily to blisters on legs.    Historical Provider, MD  quinapril (ACCUPRIL) 40 MG tablet Take 40 mg by mouth daily.     Historical Provider, MD  simvastatin (ZOCOR) 20 MG tablet Take 20 mg by mouth every evening.    Historical Provider, MD  ursodiol (ACTIGALL) 250 MG tablet Take 250 mg by mouth daily.    Historical Provider, MD   BP 126/67 mmHg  Pulse 70  Temp(Src) 98.8 F (37.1 C) (Oral)  Resp 15  SpO2 97% Physical Exam  Constitutional: He is oriented to person, place, and time. He appears well-developed and well-nourished.  Non-toxic appearance. No distress.  HENT:  Head: Normocephalic and atraumatic.  Eyes: Conjunctivae, EOM and lids are normal. Pupils  are equal, round, and reactive to light.  Neck: Normal range of motion. Neck supple. No tracheal deviation present. No thyroid mass present.  Cardiovascular: Normal rate, regular rhythm and normal heart sounds.  Exam reveals no gallop.   No murmur heard. Pulmonary/Chest: Effort normal and breath sounds normal. No stridor. No respiratory distress. He has no decreased breath sounds. He has no wheezes. He has no rhonchi. He has no rales.  Abdominal: Soft. Normal appearance and bowel sounds are normal. He exhibits no distension. There is no tenderness. There is no rebound and no CVA tenderness.  Musculoskeletal:  Normal range of motion. He exhibits no edema or tenderness.  See attached photos.  Erythema with central necrosis  Neurological: He is alert and oriented to person, place, and time. He has normal strength. No cranial nerve deficit or sensory deficit. GCS eye subscore is 4. GCS verbal subscore is 5. GCS motor subscore is 6.  Skin: Skin is warm and dry. No abrasion and no rash noted.  Psychiatric: He has a normal mood and affect. His speech is normal and behavior is normal.  Nursing note and vitals reviewed.   ED Course  Procedures (including critical care time) Labs Review Labs Reviewed - No data to display  Imaging Review No results found.   EKG Interpretation None      MDM   Final diagnoses:  None        Patient with early cellulitis and we placed on doxycycline and return precautions given  Lacretia Leigh, MD 09/04/14 [phone]

## 2014-09-15 ENCOUNTER — Encounter (HOSPITAL_COMMUNITY): Payer: Self-pay | Admitting: Emergency Medicine

## 2014-09-15 ENCOUNTER — Emergency Department (HOSPITAL_COMMUNITY)
Admission: EM | Admit: 2014-09-15 | Discharge: 2014-09-15 | Disposition: A | Discharge status: Home / Self Care | Payer: Medicare Other | Attending: Emergency Medicine | Admitting: Emergency Medicine

## 2014-09-15 DIAGNOSIS — E785 Hyperlipidemia, unspecified: Secondary | ICD-10-CM | POA: Diagnosis not present

## 2014-09-15 DIAGNOSIS — I129 Hypertensive chronic kidney disease with stage 1 through stage 4 chronic kidney disease, or unspecified chronic kidney disease: Secondary | ICD-10-CM | POA: Insufficient documentation

## 2014-09-15 DIAGNOSIS — E1140 Type 2 diabetes mellitus with diabetic neuropathy, unspecified: Secondary | ICD-10-CM | POA: Diagnosis not present

## 2014-09-15 DIAGNOSIS — I2510 Atherosclerotic heart disease of native coronary artery without angina pectoris: Secondary | ICD-10-CM | POA: Insufficient documentation

## 2014-09-15 DIAGNOSIS — I252 Old myocardial infarction: Secondary | ICD-10-CM | POA: Insufficient documentation

## 2014-09-15 DIAGNOSIS — E11649 Type 2 diabetes mellitus with hypoglycemia without coma: Secondary | ICD-10-CM | POA: Insufficient documentation

## 2014-09-15 DIAGNOSIS — Z794 Long term (current) use of insulin: Secondary | ICD-10-CM | POA: Insufficient documentation

## 2014-09-15 DIAGNOSIS — Z720 Tobacco use: Secondary | ICD-10-CM | POA: Insufficient documentation

## 2014-09-15 DIAGNOSIS — Z7982 Long term (current) use of aspirin: Secondary | ICD-10-CM | POA: Insufficient documentation

## 2014-09-15 DIAGNOSIS — N182 Chronic kidney disease, stage 2 (mild): Secondary | ICD-10-CM | POA: Insufficient documentation

## 2014-09-15 DIAGNOSIS — I509 Heart failure, unspecified: Secondary | ICD-10-CM | POA: Insufficient documentation

## 2014-09-15 DIAGNOSIS — Z79899 Other long term (current) drug therapy: Secondary | ICD-10-CM | POA: Diagnosis not present

## 2014-09-15 DIAGNOSIS — E162 Hypoglycemia, unspecified: Secondary | ICD-10-CM

## 2014-09-15 LAB — I-STAT TROPONIN, ED: Troponin i, poc: 0.02 ng/mL (ref 0.00–0.08)

## 2014-09-15 LAB — COMPREHENSIVE METABOLIC PANEL
ALBUMIN: 3.3 g/dL — AB (ref 3.5–5.0)
ALT: 18 U/L (ref 17–63)
AST: 25 U/L (ref 15–41)
Alkaline Phosphatase: 84 U/L (ref 38–126)
Anion gap: 7 (ref 5–15)
BUN: 26 mg/dL — ABNORMAL HIGH (ref 6–20)
CO2: 22 mmol/L (ref 22–32)
CREATININE: 1.63 mg/dL — AB (ref 0.61–1.24)
Calcium: 9 mg/dL (ref 8.9–10.3)
Chloride: 109 mmol/L (ref 101–111)
GFR calc Af Amer: 47 mL/min — ABNORMAL LOW (ref >60)
GFR, EST NON AFRICAN AMERICAN: 40 mL/min — AB (ref >60)
Glucose, Bld: 104 mg/dL — ABNORMAL HIGH (ref 65–99)
Potassium: 3.8 mmol/L (ref 3.5–5.1)
Sodium: 138 mmol/L (ref 135–145)
Total Bilirubin: 0.7 mg/dL (ref 0.3–1.2)
Total Protein: 7.1 g/dL (ref 6.5–8.1)

## 2014-09-15 LAB — CBC WITH DIFFERENTIAL/PLATELET
Basophils Absolute: 0 10*3/uL (ref 0.0–0.1)
Basophils Relative: 0 % (ref 0–1)
EOS PCT: 1 % (ref 0–5)
Eosinophils Absolute: 0.1 10*3/uL (ref 0.0–0.7)
HCT: 35.5 % — ABNORMAL LOW (ref 39.0–52.0)
HEMOGLOBIN: 11.7 g/dL — AB (ref 13.0–17.0)
LYMPHS ABS: 1.3 10*3/uL (ref 0.7–4.0)
Lymphocytes Relative: 28 % (ref 12–46)
MCH: 26.8 pg (ref 26.0–34.0)
MCHC: 33 g/dL (ref 30.0–36.0)
MCV: 81.2 fL (ref 78.0–100.0)
MONO ABS: 0.3 10*3/uL (ref 0.1–1.0)
Monocytes Relative: 7 % (ref 3–12)
NEUTROS ABS: 2.9 10*3/uL (ref 1.7–7.7)
NEUTROS PCT: 64 % (ref 43–77)
Platelets: 257 10*3/uL (ref 150–400)
RBC: 4.37 MIL/uL (ref 4.22–5.81)
RDW: 14.9 % (ref 11.5–15.5)
WBC: 4.6 10*3/uL (ref 4.0–10.5)

## 2014-09-15 LAB — CBG MONITORING, ED
Glucose-Capillary: 101 mg/dL — ABNORMAL HIGH (ref 65–99)
Glucose-Capillary: 174 mg/dL — ABNORMAL HIGH (ref 65–99)

## 2014-09-15 LAB — URINALYSIS, ROUTINE W REFLEX MICROSCOPIC
Bilirubin Urine: NEGATIVE
Glucose, UA: NEGATIVE mg/dL
Hgb urine dipstick: NEGATIVE
Ketones, ur: NEGATIVE mg/dL
LEUKOCYTES UA: NEGATIVE
NITRITE: NEGATIVE
PH: 5 (ref 5.0–8.0)
PROTEIN: 100 mg/dL — AB
SPECIFIC GRAVITY, URINE: 1.014 (ref 1.005–1.030)
UROBILINOGEN UA: 0.2 mg/dL (ref 0.0–1.0)

## 2014-09-15 LAB — URINE MICROSCOPIC-ADD ON

## 2014-09-15 NOTE — ED Provider Notes (Signed)
CSN: [id number]     Arrival date & time 09/15/14  1718 History   First MD Initiated Contact with Patient 09/15/14 1719     Chief Complaint  Patient presents with  . Hypoglycemia     (Consider location/radiation/quality/duration/timing/severity/associated sxs/prior Treatment) The history is provided by the patient.  Zachary Hoffman is a 74 y.o. male hx of HTN, CHF, MI, PAD, DM here with altered mental status, hypoglycemia. Patient said he took his Lantus 55 units before bedtime last night and didn't eat anything today. Denies any vomiting was found on the floor alert but disoriented and combative. CBG was 28. Given 1 amp D50 by EMS. Patient states that he feels fine now.    Past Medical History  Diagnosis Date  . Hypertension   . CHF (congestive heart failure)     Echo 07/2006 with normal LV fxn, and diastolic dysfxn  . MI (myocardial infarction)     S/p stenting per patient. Last stress 04/2007 negative   . Peripheral arterial disease     S/p left femoro-femoral bypass. LE cath by Dr. Einar Gip 09/2007 without significant stenosis  . Tobacco abuse   . Diverticulosis 05/2009    Noted on screening colo   . Coronary artery disease   . Headache(784.0)   . Diabetes mellitus     On insulin   . Neuropathy   . 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 Dyslipidemia   . Pancreatitis     P/H   Past Surgical History  Procedure Laterality Date  . Replacement total hip w/  resurfacing implants      Left  . Femoral-femoral bypass graft      In [place], on the left. Patent on cath 09/2007  . Coronary angioplasty with stent placement  2004    Mid LAD, 95% first obtuse marginal   . Doppler echocardiography  07/28/2006    Mod. LVH,EF =>55%,LA mildly dilated,mitral annular ca+,AOV mildly sclerotic,trace AI  . Nuclear stress test  09/17/2010    No ischemia  . Lower extremity angiogram  09/20/2007    no significant stenosis   Family History  Problem Relation Age of Onset  . Stroke  Mother   . Diabetes Father   . Cancer Father    History  Substance Use Topics  . Smoking status: Current Every Day Smoker -- 0.50 packs/day for 50 years    Types: Cigarettes  . Smokeless tobacco: Never Used  . Alcohol Use: No    Review of Systems  Neurological: Positive for dizziness and weakness.  All other systems reviewed and are negative.     Allergies  Review of patient's allergies indicates no known allergies.  Home Medications   Prior to Admission medications   Medication Sig Start Date End Date Taking? Authorizing Provider  albuterol (PROVENTIL HFA;VENTOLIN HFA) 108 (90 BASE) MCG/ACT inhaler Inhale 2 puffs into the lungs every 6 (six) hours as needed for wheezing or shortness of breath.   Yes Historical Provider, MD  amLODipine (NORVASC) 10 MG tablet Take 10 mg by mouth daily.   Yes Historical Provider, MD  aspirin 81 MG tablet Take 81 mg by mouth daily.   Yes Historical Provider, MD  bismuth subsalicylate (PEPTO BISMOL) 262 MG/15ML suspension Take 30 mLs by mouth every 6 (six) hours as needed (heartburn).   Yes Historical Provider, MD  clopidogrel (PLAVIX) 75 MG tablet Take 75 mg by mouth daily.   Yes Historical Provider, MD  colchicine 0.6 MG tablet  Take 0.6 mg by mouth daily as needed (for gout flare up).    Yes Historical Provider, MD  cyclobenzaprine (FLEXERIL) 10 MG tablet Take 1 tablet (10 mg total) by mouth 2 (two) times daily as needed for muscle spasms. 04/09/14  Yes Blanchie Dessert, MD  dexlansoprazole (DEXILANT) 60 MG capsule Take 60 mg by mouth daily.   Yes Historical Provider, MD  doxazosin (CARDURA) 2 MG tablet Take 2 mg by mouth daily.  03/08/14  Yes Historical Provider, MD  doxycycline (VIBRAMYCIN) 100 MG capsule Take 1 capsule (100 mg total) by mouth 2 (two) times daily. 09/04/14  Yes Lacretia Leigh, MD  furosemide (LASIX) 80 MG tablet Take 0.5 tablets (40 mg total) by mouth daily. Patient taking differently: Take 40 mg by mouth daily as needed.  12/01/13   Yes Mihai Croitoru, MD  ibuprofen (ADVIL,MOTRIN) 200 MG tablet Take 400 mg by mouth every 6 (six) hours as needed for moderate pain.   Yes Historical Provider, MD  insulin glargine (LANTUS) 100 UNIT/ML injection Inject 55 Units into the skin at bedtime.    Yes Historical Provider, MD  Iron-Vitamins (GERITOL PO) Take 1 capsule by mouth every morning.   Yes Historical Provider, MD  Linaclotide (LINZESS PO) Take 1 capsule by mouth daily.   Yes Historical Provider, MD  methocarbamol (ROBAXIN) 500 MG tablet Take 500 mg by mouth daily as needed. For muscle relaxer   Yes Historical Provider, MD  metoprolol succinate (TOPROL-XL) 100 MG 24 hr tablet Take 100 mg by mouth daily. Take with or immediately following a meal.   Yes Historical Provider, MD  niacin (NIASPAN) 500 MG CR tablet Take 500 mg by mouth every morning.   Yes Historical Provider, MD  OMEGA-3 KRILL OIL PO Take 1 capsule by mouth every morning.   Yes Historical Provider, MD  oxyCODONE-acetaminophen (PERCOCET/ROXICET) 5-325 MG per tablet Take 1-2 tablets by mouth every 6 (six) hours as needed for severe pain. 05/30/14  Yes Carmin Muskrat, MD  pantoprazole (PROTONIX) 40 MG tablet Take 40 mg by mouth daily as needed. For acid reflux   Yes Historical Provider, MD  pregabalin (LYRICA) 50 MG capsule Take 100 mg by mouth 2 (two) times daily.    Yes Historical Provider, MD  PRESCRIPTION MEDICATION Inject 10 Units into the skin daily before supper.   Yes Historical Provider, MD  PRESCRIPTION MEDICATION Apply 1 application topically 2 (two) times daily. Apply twice daily to blisters on legs.   Yes Historical Provider, MD  quinapril (ACCUPRIL) 40 MG tablet Take 40 mg by mouth daily.    Yes Historical Provider, MD  simvastatin (ZOCOR) 20 MG tablet Take 20 mg by mouth every evening.   Yes Historical Provider, MD  ursodiol (ACTIGALL) 250 MG tablet Take 250 mg by mouth daily.   Yes Historical Provider, MD   BP 133/57 mmHg  Pulse 58  Temp(Src) 98.3 F (36.8  C) (Oral)  Resp 12  Ht 5\' 7"  (1.702 m)  Wt 210 lb (95.255 kg)  BMI 32.88 kg/m2  SpO2 97% Physical Exam  Constitutional: He is oriented to person, place, and time. He appears well-developed and well-nourished.  HENT:  Head: Normocephalic.  Mouth/Throat: Oropharynx is clear and moist.  Eyes: Conjunctivae are normal. Pupils are equal, round, and reactive to light.  Neck: Normal range of motion. Neck supple.  Cardiovascular: Normal rate, regular rhythm and normal heart sounds.   Pulmonary/Chest: Effort normal and breath sounds normal. No respiratory distress. He has no wheezes. He has no  rales.  Abdominal: Bowel sounds are normal. He exhibits no distension. There is no tenderness. There is no rebound.  Musculoskeletal: Normal range of motion. He exhibits no edema or tenderness.  Neurological: He is alert and oriented to person, place, and time. No cranial nerve deficit. Coordination normal.  Skin: Skin is warm and dry.  Psychiatric: He has a normal mood and affect. His behavior is normal. Judgment and thought content normal.  Nursing note and vitals reviewed.   ED Course  Procedures (including critical care time) Labs Review Labs Reviewed  CBC WITH DIFFERENTIAL/PLATELET - Abnormal; Notable for the following:    Hemoglobin 11.7 (*)    HCT 35.5 (*)    All other components within normal limits  COMPREHENSIVE METABOLIC PANEL - Abnormal; Notable for the following:    Glucose, Bld 104 (*)    BUN 26 (*)    Creatinine, Ser 1.63 (*)    Albumin 3.3 (*)    GFR calc non Af Amer 40 (*)    GFR calc Af Amer 47 (*)    All other components within normal limits  URINALYSIS, ROUTINE W REFLEX MICROSCOPIC (NOT AT [facility]) - Abnormal; Notable for the following:    Protein, ur 100 (*)    All other components within normal limits  CBG MONITORING, ED - Abnormal; Notable for the following:    Glucose-Capillary 101 (*)    All other components within normal limits  CBG MONITORING, ED - Abnormal; Notable  for the following:    Glucose-Capillary 174 (*)    All other components within normal limits  URINE MICROSCOPIC-ADD ON  I-STAT TROPOININ, ED    Imaging Review No results found.   EKG Interpretation   Date/Time:  Friday September 15 2014 17:28:30 EDT Ventricular Rate:  62 PR Interval:  183 QRS Duration: 82 QT Interval:  446 QTC Calculation: 453 R Axis:   85 Text Interpretation:  Sinus rhythm Borderline right axis deviation No  previous ECGs available Confirmed by YAO  MD, DAVID ([id number]) on 09/15/2014  5:31:50 PM      MDM   Final diagnoses:  None   Zachary Hoffman is a 74 y.o. male here with hypoglycemia. Now alert and oriented and nl neuro exam. Will check labs and give him some food and reassess.   8:32 PM CBG stable. Remain alert and oriented. Told him to hold tonight's dose of lantus. Will dc home.     Wandra Arthurs, MD 09/15/14 2033

## 2014-09-15 NOTE — Discharge Instructions (Signed)
Don't take your lantus tonight.   Follow up with your doctor.   Please eat regularly.   Return to ER if you are confused, vomiting, blood sugar too high or low.

## 2014-09-15 NOTE — ED Notes (Addendum)
Found by family in floor of bedroom; alert, but combative, disoriented, and unable to follow commands. CBG 28. Given 25g of D50 IV. Quickly woke up and was A&O x4. CBG 236 after D50. Arrives to ED A&Ox4. Reports he took Lantus 55 units before bedtime last night and has not eaten today. Also a sliding scale insulin 10 units last night before he ate dinner. Does not remember waking up this morning. Has not taken any medication today. CBG on arrival 101.

## 2014-09-15 NOTE — ED Notes (Signed)
Finished eating sandwich, applesauce, graham crackers with peanut butter, and drinking orange juice.

## 2014-10-11 ENCOUNTER — Other Ambulatory Visit: Payer: Self-pay | Admitting: Cardiovascular Disease

## 2014-10-11 DIAGNOSIS — I739 Peripheral vascular disease, unspecified: Secondary | ICD-10-CM

## 2014-10-16 ENCOUNTER — Inpatient Hospital Stay (HOSPITAL_COMMUNITY): Admission: RE | Admit: 2014-10-16 | Payer: BLUE CROSS/BLUE SHIELD | Source: Ambulatory Visit

## 2014-10-16 ENCOUNTER — Encounter (HOSPITAL_COMMUNITY): Payer: BLUE CROSS/BLUE SHIELD

## 2014-10-24 ENCOUNTER — Other Ambulatory Visit: Payer: Self-pay | Admitting: Cardiovascular Disease

## 2014-10-24 ENCOUNTER — Ambulatory Visit (HOSPITAL_COMMUNITY): Payer: Medicare Other | Attending: Cardiology

## 2014-10-24 ENCOUNTER — Ambulatory Visit (HOSPITAL_COMMUNITY): Payer: Medicare Other

## 2014-10-24 DIAGNOSIS — I2510 Atherosclerotic heart disease of native coronary artery without angina pectoris: Secondary | ICD-10-CM | POA: Diagnosis not present

## 2014-10-24 DIAGNOSIS — E119 Type 2 diabetes mellitus without complications: Secondary | ICD-10-CM | POA: Diagnosis not present

## 2014-10-24 DIAGNOSIS — Z95828 Presence of other vascular implants and grafts: Secondary | ICD-10-CM | POA: Insufficient documentation

## 2014-10-24 DIAGNOSIS — I739 Peripheral vascular disease, unspecified: Secondary | ICD-10-CM | POA: Diagnosis present

## 2014-10-24 DIAGNOSIS — E785 Hyperlipidemia, unspecified: Secondary | ICD-10-CM | POA: Insufficient documentation

## 2014-10-24 DIAGNOSIS — I779 Disorder of arteries and arterioles, unspecified: Secondary | ICD-10-CM | POA: Insufficient documentation

## 2014-10-24 DIAGNOSIS — I10 Essential (primary) hypertension: Secondary | ICD-10-CM | POA: Insufficient documentation

## 2014-11-13 ENCOUNTER — Ambulatory Visit: Payer: BLUE CROSS/BLUE SHIELD | Admitting: Cardiovascular Disease

## 2014-12-19 ENCOUNTER — Other Ambulatory Visit: Payer: Self-pay

## 2014-12-19 DIAGNOSIS — E118 Type 2 diabetes mellitus with unspecified complications: Secondary | ICD-10-CM

## 2014-12-19 NOTE — Patient Outreach (Signed)
[place] [facility]) Care Management  12/19/2014  VIRGEL ARPINO May 19, 1940 [id number]   Referral Date: 12-19-14 Referral Source: [employer] Tier 2 List Referral Issue: Diabetes- History of PAD, kidney disease stage 2, HTN  (2 ED visits within 6 months.  Dx: hypoglycemia, cellulitis.) PCP: [name]  Patient reports he lives in his home with family.   Denies any falls DME: Straight Cane, Glucometer Diabetes: Patient unsure of last A1c. Blood sugar today 125.    Assessment: Patient would benefit from health coach calls for diabetes disease management and patient agrees to outreach from health coach for disease management.  Plan: RN will refer patient to health coach for diabetes disease management  Danett Palazzo Durwin Reges, RN, MSN [place] [phone]

## 2014-12-19 NOTE — Patient Outreach (Signed)
[place] [facility]) Care Management  12/19/2014  Zachary Hoffman [birth year] [id number]   Referral from [place] 2 List, assigned Jon Billings, RN to outreach.  Thanks, [name]. [place], [place] Assistant Phone: [phone] Fax: [phone]

## 2014-12-21 ENCOUNTER — Other Ambulatory Visit: Payer: Self-pay

## 2014-12-21 NOTE — Patient Outreach (Signed)
[place] [facility]) Care Management  12/21/2014  Zachary Hoffman 1941-02-04 [id number]   Telephone call to patient for initial health coach assessment.  No answer. HIPAA compliant voice message left.    Plan: RN Health Coach will plan to contact patient within 1-2 weeks.   Jone Baseman, RN, MSN [place] [phone]

## 2014-12-29 ENCOUNTER — Other Ambulatory Visit: Payer: Self-pay

## 2014-12-29 NOTE — Patient Outreach (Signed)
[place] [facility]) Care Management  12/29/2014  Zachary Hoffman 17-Aug-1940 [id number]  Second telephone call to patient for initial assessment. No answer. HIPAA compliant voice message.    Plan: RN Health Coach will attempt patient again in 1-2 weeks.   Jone Baseman, RN, MSN [place] [phone]

## 2015-01-02 ENCOUNTER — Other Ambulatory Visit: Payer: Self-pay

## 2015-01-02 NOTE — Patient Outreach (Signed)
[place] [facility]) Care Management  01/02/2015  TRAYDEN REVEAL 30-Jan-1941 [id number]   Third Telephone call to patient for initial assessment attempt.  No answer.  HIPAA compliant voice message left.    Plan: RN Health Coach will send letter to attempt outreach.    Jone Baseman, RN, MSN [place] [phone]

## 2015-01-17 NOTE — Patient Outreach (Signed)
[place] [facility]) Care Management  01/17/2015  KINGMICHAEL HARTSFIELD 09/10/1940 [id number]   No response from patient after 3 outreach calls and letter.  Plan: RN Health Coach will send closure letter to patient. RN Health Coach will send closure letter to primary care physician. RN Health Coach will forward patient information to Lurline Del for case closure.    Jone Baseman, RN, MSN [place] [phone]

## 2015-02-01 NOTE — Patient Outreach (Signed)
[place] [facility]) Care Management  02/01/2015  YASSINE CENTER Nov 06, 1940 [id number]   Notification from Jon Billings, RN to close case due to unable to contact patient for [place] Management services.  Thanks, [name]. [place], [place] Assistant Phone: [phone] Fax: [phone]

## 2015-02-22 ENCOUNTER — Encounter: Payer: Self-pay | Admitting: Cardiovascular Disease

## 2015-02-22 ENCOUNTER — Ambulatory Visit (INDEPENDENT_AMBULATORY_CARE_PROVIDER_SITE_OTHER): Payer: Medicare Other | Admitting: Cardiovascular Disease

## 2015-02-22 VITALS — BP 126/64 | HR 61 | Resp 10 | Ht 67.0 in | Wt 194.3 lb

## 2015-02-22 DIAGNOSIS — N183 Chronic kidney disease, stage 3 (moderate): Secondary | ICD-10-CM

## 2015-02-22 DIAGNOSIS — E785 Hyperlipidemia, unspecified: Secondary | ICD-10-CM

## 2015-02-22 DIAGNOSIS — Z9989 Dependence on other enabling machines and devices: Secondary | ICD-10-CM

## 2015-02-22 DIAGNOSIS — I10 Essential (primary) hypertension: Secondary | ICD-10-CM

## 2015-02-22 DIAGNOSIS — I739 Peripheral vascular disease, unspecified: Secondary | ICD-10-CM | POA: Diagnosis not present

## 2015-02-22 DIAGNOSIS — Z720 Tobacco use: Secondary | ICD-10-CM

## 2015-02-22 DIAGNOSIS — G4733 Obstructive sleep apnea (adult) (pediatric): Secondary | ICD-10-CM

## 2015-02-22 DIAGNOSIS — Z79899 Other long term (current) drug therapy: Secondary | ICD-10-CM

## 2015-02-22 DIAGNOSIS — I2510 Atherosclerotic heart disease of native coronary artery without angina pectoris: Secondary | ICD-10-CM | POA: Diagnosis not present

## 2015-02-22 DIAGNOSIS — E1122 Type 2 diabetes mellitus with diabetic chronic kidney disease: Secondary | ICD-10-CM

## 2015-02-22 DIAGNOSIS — E669 Obesity, unspecified: Secondary | ICD-10-CM

## 2015-02-22 DIAGNOSIS — Z794 Long term (current) use of insulin: Secondary | ICD-10-CM

## 2015-02-22 LAB — COMPREHENSIVE METABOLIC PANEL
ALT: 20 U/L (ref 9–46)
AST: 23 U/L (ref 10–35)
Albumin: 3.8 g/dL (ref 3.6–5.1)
Alkaline Phosphatase: 94 U/L (ref 40–115)
BUN: 20 mg/dL (ref 7–25)
CHLORIDE: 105 mmol/L (ref 98–110)
CO2: 25 mmol/L (ref 20–31)
CREATININE: 1.54 mg/dL — AB (ref 0.70–1.18)
Calcium: 9.4 mg/dL (ref 8.6–10.3)
Glucose, Bld: 94 mg/dL (ref 65–99)
POTASSIUM: 4.1 mmol/L (ref 3.5–5.3)
SODIUM: 140 mmol/L (ref 135–146)
TOTAL PROTEIN: 7 g/dL (ref 6.1–8.1)
Total Bilirubin: 0.7 mg/dL (ref 0.2–1.2)

## 2015-02-22 LAB — LIPID PANEL
CHOL/HDL RATIO: 4.7 ratio (ref <=5.0)
Cholesterol: 108 mg/dL — ABNORMAL LOW (ref 125–200)
HDL: 23 mg/dL — AB (ref >=40)
LDL Cholesterol: 65 mg/dL (ref <130)
Triglycerides: 98 mg/dL (ref <150)
VLDL: 20 mg/dL (ref <30)

## 2015-02-22 NOTE — Patient Instructions (Signed)
Your physician has recommended you make the following change in your medication: DECREASE METOPROLOL TO 50 MG DAILY  Your physician recommends that you return for lab work in: [place] has requested that you have a lower extremity arterial exercise duplex. During this test, exercise and ultrasound are used to evaluate arterial blood flow in the legs. Allow one hour for this exam. There are no restrictions or special instructions.  Dr. Sallyanne Kuster recommends that you schedule a follow-up appointment in: 6 MONTHS

## 2015-02-22 NOTE — Progress Notes (Signed)
Patient ID: Zachary Hoffman, male   DOB: Aug 31, 1940, 74 y.o.   MRN: [id number]     Cardiology Office Note   Date:  02/23/2015   ID:  NASAIR Hoffman, DOB 05/03/1940, MRN [id number]  PCP:  Philis Fendt, MD  Cardiologist:   Sanda Klein, MD   Chief Complaint  Patient presents with  . ROV 6 months    patient reports left leg swelling - goes down at night, legs get tired.      History of Present Illness: Zachary Hoffman is a 74 y.o. male who presents for  Follow-up for CAD , PAD , ongoing tobacco abuse, obesity and obstructive sleep apnea , chronic kidney disease stage III related to diabetes mellitus on insulin therapy.   He has not had problems with angina or dyspnea. He denies intermittent claudication or any ulcers or sores on his feet. He has cut back smoking to half a pack a day but has not been able to quit altogether and is skeptical that he will succeed. Glucose control has been erratic and his most recent hemoglobin A1c was 8.9% in early September. A few months ago he was hospitalized with loss of consciousness related to severe hypoglycemia (glucose equals 24). He does not recall any prodromal symptoms before losing consciousness. He is now seeing Dr. Erling Cruz for chronic kidney disease that is probably stage III. Baseline creatinine seems to be around 1.6.  Is intense pruritus seems to be improving, not entirely sure why.   He has an extensive history of peripheral arterial disease. In 2009 he had a left common femoral to superficial femoral artery bypass procedure for occlusive disease. He also has a history of coronary artery disease and has previously received a stent to the LAD (2004, [place], no details known). His most recent arterial Doppler study was performed in July of 2016. 1.0 and an improved left ABI of 0.87 (had been 0.68). The femoral to femoral bypass graft was widely patent with normal velocities. He has a one vessel runoff via the peroneal artery on the left. On  the right side the anterior tibial artery is occluded. Normal LV function by nuclear stress in 2012: EF 64%, normal myocardial perfusion.  Past Medical History  Diagnosis Date  . Hypertension   . CHF (congestive heart failure) ([place])     Echo 07/2006 with normal LV fxn, and diastolic dysfxn  . MI (myocardial infarction) ([place])     S/p stenting per patient. Last stress 04/2007 negative   . Peripheral arterial disease ([facility])     S/p left femoro-femoral bypass. LE cath by Dr. Einar Gip 09/2007 without significant stenosis  . Tobacco abuse   . Diverticulosis 05/2009    Noted on screening colo   . Coronary artery disease   . Headache(784.0)   . Diabetes mellitus     On insulin   . Neuropathy ([place])   . 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 Dyslipidemia   . Pancreatitis     P/H    Past Surgical History  Procedure Laterality Date  . Replacement total hip w/  resurfacing implants      Left  . Femoral-femoral bypass graft      In [place], on the left. Patent on cath 09/2007  . Coronary angioplasty with stent placement  2004    Mid LAD, 95% first obtuse marginal   . Doppler echocardiography  07/28/2006    Mod. LVH,EF =>55%,LA mildly dilated,mitral annular ca+,AOV mildly  sclerotic,trace AI  . Nuclear stress test  09/17/2010    No ischemia  . Lower extremity angiogram  09/20/2007    no significant stenosis     Current Outpatient Prescriptions  Medication Sig Dispense Refill  . albuterol (PROVENTIL HFA;VENTOLIN HFA) 108 (90 BASE) MCG/ACT inhaler Inhale 2 puffs into the lungs every 6 (six) hours as needed for wheezing or shortness of breath.    Marland Kitchen amLODipine (NORVASC) 10 MG tablet Take 10 mg by mouth daily.    Marland Kitchen aspirin 325 MG tablet Take 325 mg by mouth daily.    Marland Kitchen bismuth subsalicylate (PEPTO BISMOL) 262 MG/15ML suspension Take 30 mLs by mouth every 6 (six) hours as needed (heartburn).    . clopidogrel (PLAVIX) 75 MG tablet Take 75 mg by mouth daily.    . colchicine 0.6 MG  tablet Take 0.6 mg by mouth daily as needed (for gout flare up).     . cyclobenzaprine (FLEXERIL) 10 MG tablet Take 10 mg by mouth 3 (three) times daily as needed for muscle spasms.    Marland Kitchen dexlansoprazole (DEXILANT) 60 MG capsule Take 60 mg by mouth daily.    Marland Kitchen doxazosin (CARDURA) 1 MG tablet Take 2 mg by mouth at bedtime.  1  . furosemide (LASIX) 40 MG tablet Take 1 tablet by mouth daily.  5  . ibuprofen (ADVIL,MOTRIN) 200 MG tablet Take 400 mg by mouth every 6 (six) hours as needed for moderate pain.    Marland Kitchen insulin glulisine (APIDRA) 100 UNIT/ML injection Inject 10 Units into the skin daily.    . Iron-Vitamins (GERITOL PO) Take 1 capsule by mouth every morning.    Marland Kitchen LANTUS SOLOSTAR 100 UNIT/ML Solostar Pen Inject 50 Units into the skin daily.  5  . methocarbamol (ROBAXIN) 500 MG tablet Take 500 mg by mouth daily as needed. For muscle relaxer    . metoprolol succinate (TOPROL-XL) 100 MG 24 hr tablet Take 50 mg by mouth daily.    . niacin (NIASPAN) 500 MG CR tablet Take 500 mg by mouth every morning.    Marland Kitchen OMEGA-3 KRILL OIL PO Take 1 capsule by mouth every morning.    Marland Kitchen oxyCODONE-acetaminophen (PERCOCET/ROXICET) 5-325 MG per tablet Take 1-2 tablets by mouth every 6 (six) hours as needed for severe pain. 15 tablet 0  . pantoprazole (PROTONIX) 40 MG tablet Take 40 mg by mouth daily as needed. For acid reflux    . pregabalin (LYRICA) 150 MG capsule Take 150 mg by mouth 3 (three) times daily.    . quinapril (ACCUPRIL) 40 MG tablet Take 40 mg by mouth daily.     . simvastatin (ZOCOR) 20 MG tablet Take 20 mg by mouth every evening.    . triamcinolone ointment (KENALOG) 0.1 % Apply 1 application topically 2 (two) times daily as needed.  2  . ursodiol (ACTIGALL) 250 MG tablet Take 250 mg by mouth daily.    . vardenafil (LEVITRA) 10 MG tablet Take 10 mg by mouth daily as needed for erectile dysfunction.     No current facility-administered medications for this visit.    Allergies:   Review of patient's  allergies indicates no known allergies.    Social History:  The patient  reports that he has been smoking Cigarettes.  He has a 25 pack-year smoking history. He has never used smokeless tobacco. He reports that he does not drink alcohol or use illicit drugs.   Family History:  The patient's family history includes Cancer in his father; Diabetes in  his father; Stroke in his mother.    ROS:  Please see the history of present illness.    Otherwise, review of systems positive for none.   All other systems are reviewed and negative.    PHYSICAL EXAM: VS:  BP 126/64 mmHg  Pulse 61  Resp 10  Ht 5\' 7"  (1.702 m)  Wt 194 lb 4.8 oz (88.134 kg)  BMI 30.42 kg/m2 , BMI Body mass index is 30.42 kg/(m^2).  General: Alert, oriented x3, no distress Head: no evidence of trauma, PERRL, EOMI, no exophtalmos or lid lag, no myxedema, no xanthelasma; normal ears, nose and oropharynx Neck: normal jugular venous pulsations and no hepatojugular reflux; brisk carotid pulses without delay and no carotid bruits Chest: clear to auscultation, no signs of consolidation by percussion or palpation, normal fremitus, symmetrical and full respiratory excursions Cardiovascular: normal position and quality of the apical impulse, regular rhythm, normal first and second heart sounds, no murmurs, rubs or gallops Abdomen: no tenderness or distention, no masses by palpation, no abnormal pulsatility or arterial bruits, normal bowel sounds, no hepatosplenomegaly Extremities: no clubbing, cyanosis or edema; 2+ radial, ulnar and brachial pulses bilaterally; 2+ right femoral, posterior tibial and dorsalis pedis pulses; 2+ left femoral, posterior tibial and dorsalis pedis pulses; no subclavian or femoral bruits Neurological: grossly nonfocal Psych: euthymic mood, full affect   EKG:  EKG is ordered today. The ekg ordered today demonstrates  Normal sinus rhythm 61 bpm, no repolarization abnormalities   Recent Labs: 09/15/2014:  Hemoglobin 11.7*; Platelets 257 02/22/2015: ALT 20; BUN 20; Creat 1.54*; Potassium 4.1; Sodium 140    Lipid Panel    Component Value Date/Time   CHOL 108* 02/22/2015 1222   TRIG 98 02/22/2015 1222   HDL 23* 02/22/2015 1222   CHOLHDL 4.7 02/22/2015 1222   VLDL 20 02/22/2015 1222   LDLCALC 65 02/22/2015 1222      Wt Readings from Last 3 Encounters:  02/22/15 194 lb 4.8 oz (88.134 kg)  09/15/14 210 lb (95.255 kg)  06/05/14 209 lb 11.2 oz (95.119 kg)     .   ASSESSMENT AND PLAN:  1. CAD s/p LAD stent. Asymptomatic. Normal nuclear study in 2012. Normal LVEF. Focus is on risk factor control , especially smoking cessation and better diabetes management.  2. PAD. He denies intermittent claudication and his ankle-brachial indices are unchanged from last year , improved from previous reports. Patent bypass to his left femoral artery.  3. Hyperlipidemia. Excellent lipid profile except for very low HDL cholesterol , expected to improve only if he loses substantial weight and exercises more.  4.  Ongoing tobacco abuse. Focused large part of her discussion on smoking cessation efforts. He does not yet appear ready to commit to a plan for smoking cessation.  5.   Insulin-requiring diabetes mellitus. 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 His blood pressure is excellent. We'll decrease the dose of beta blocker to 50 mg daily. He is to call us if he develops elevated blood pressure or angina pectoris or arrhythmia symptoms.  6.  Chronic kidney disease stage III, seeing Dr. Florene Glen    Current medicines are reviewed at length with the patient today.  The patient does not have concerns regarding medicines.  The following changes have been made:   Reduce metoprolol to  50 mg daily  Labs/ tests ordered today include:  Orders Placed This Encounter  Procedures  . Lipid panel  . Comprehensive metabolic panel  . EKG 12-Lead   Patient Instructions  Your physician has recommended  you make the following change in your medication: DECREASE METOPROLOL TO 50 MG DAILY  Your physician recommends that you return for lab work in: [place] has requested that you have a lower extremity arterial exercise duplex. During this test, exercise and ultrasound are used to evaluate arterial blood flow in the legs. Allow one hour for this exam. There are no restrictions or special instructions.  Dr. Sallyanne Kuster recommends that you schedule a follow-up appointment in: [place], Stuart Mirabile, MD  02/23/2015 1:30 PM    Sanda Klein, MD, [facility] [phone] office [phone] pager

## 2015-02-23 ENCOUNTER — Encounter: Payer: Self-pay | Admitting: Cardiovascular Disease

## 2015-02-28 ENCOUNTER — Other Ambulatory Visit: Payer: Self-pay | Admitting: Cardiovascular Disease

## 2015-02-28 DIAGNOSIS — I739 Peripheral vascular disease, unspecified: Secondary | ICD-10-CM

## 2015-03-09 ENCOUNTER — Ambulatory Visit (HOSPITAL_COMMUNITY)
Admission: RE | Admit: 2015-03-09 | Discharge: 2015-03-09 | Disposition: A | Discharge status: Home / Self Care | Payer: Medicare Other | Source: Ambulatory Visit | Attending: Cardiovascular Disease | Admitting: Cardiovascular Disease

## 2015-03-09 DIAGNOSIS — I739 Peripheral vascular disease, unspecified: Secondary | ICD-10-CM

## 2015-03-15 ENCOUNTER — Encounter: Payer: Self-pay | Admitting: Podiatry

## 2015-03-15 ENCOUNTER — Ambulatory Visit (INDEPENDENT_AMBULATORY_CARE_PROVIDER_SITE_OTHER): Payer: Medicare Other | Admitting: Podiatry

## 2015-03-15 VITALS — BP 129/65 | HR 70 | Resp 16

## 2015-03-15 DIAGNOSIS — M79676 Pain in unspecified toe(s): Secondary | ICD-10-CM | POA: Diagnosis not present

## 2015-03-15 DIAGNOSIS — B351 Tinea unguium: Secondary | ICD-10-CM

## 2015-03-15 DIAGNOSIS — G5791 Unspecified mononeuropathy of right lower limb: Secondary | ICD-10-CM | POA: Diagnosis not present

## 2015-03-15 DIAGNOSIS — I2510 Atherosclerotic heart disease of native coronary artery without angina pectoris: Secondary | ICD-10-CM | POA: Diagnosis not present

## 2015-03-15 NOTE — Progress Notes (Addendum)
   Subjective:    Patient ID: Zachary Hoffman, male    DOB: 03/04/1941, 74 y.o.   MRN: [id number]  HPI: He presents today chief complaint of painful elongated toenails and dry skin. He states that since he has diabetes his doctor wanted wanted him to be seen by Dr. have his nails cut and consider diabetic shoes.  Review of Systems  All other systems reviewed and are negative.      Objective:   Physical Exam: Vital signs stable he is alert and oriented 3 pulses are strongly palpable. Neurologic sensorium is decreased her vibratory sensation and per Semmes-Weinstein monofilament. Deep tendon reflexes are intact bilateral muscle strength +5 over 5 dorsiflexion plantar flexors and inverters everters on physical musculatures intact. Orthopedic evaluation demonstrates all joints distal to the ankle for range of motion without crepitation hammertoe deformities bilateral. Cutaneous evaluation demonstrates dry xerotic skin plantar aspect of the bilateral foot he also has some venous insufficiency with some venous dermatitis. His toenails are long thick yellow dystrophic onychomycotic hyperpigmented. No open lesions or wounds.        Assessment & Plan:  Pain in limb secondary to onychomycosis bilateral xerosis bilateral foot. Hammertoe deformities bilateral. Diabetic peripheral neuropathy. Decreased DP pulses bilateral.  Plan: Discussed appropriate shoe gear shift and assess his ice to the shoe modifications discussed appropriate hydration therapies for the foot. Debrided nails 1 through 5 bilateral Scarpa's service secondary to pain. Follow up with him in 3 months.

## 2015-05-11 ENCOUNTER — Ambulatory Visit: Payer: Medicare Other

## 2015-05-15 ENCOUNTER — Ambulatory Visit: Payer: Medicare Other | Admitting: *Deleted

## 2015-05-15 DIAGNOSIS — G5791 Unspecified mononeuropathy of right lower limb: Secondary | ICD-10-CM

## 2015-05-15 NOTE — Progress Notes (Signed)
Patient ID: Zachary Hoffman, male   DOB: Nov 07, 1940, 75 y.o.   MRN: [id number] Patient presents to be scanned and measured for diabetic shoes and inserts.

## 2015-06-08 ENCOUNTER — Ambulatory Visit (INDEPENDENT_AMBULATORY_CARE_PROVIDER_SITE_OTHER): Payer: Medicare Other | Admitting: Podiatry

## 2015-06-08 ENCOUNTER — Encounter: Payer: Self-pay | Admitting: Podiatry

## 2015-06-08 DIAGNOSIS — M79676 Pain in unspecified toe(s): Secondary | ICD-10-CM | POA: Diagnosis not present

## 2015-06-08 DIAGNOSIS — B351 Tinea unguium: Secondary | ICD-10-CM | POA: Diagnosis not present

## 2015-06-08 NOTE — Progress Notes (Signed)
Patient ID: Zachary Hoffman, male   DOB: 11-16-1940, 75 y.o.   MRN: [id number] Complaint:  Visit Type: Patient returns to my office for continued preventative foot care services. Complaint: Patient states" my nails have grown long and thick and become painful to walk and wear shoes" Patient has been diagnosed with DM with no foot complications. The patient presents for preventative foot care services. No changes to ROS  Podiatric Exam: Vascular: dorsalis pedis and posterior tibial pulses are palpable bilateral. Capillary return is immediate. Temperature gradient is WNL. Skin turgor WNL  Sensorium: Diminished  Semmes Weinstein monofilament test. Normal tactile sensation bilaterally. Nail Exam: Pt has thick disfigured discolored nails with subungual debris noted bilateral entire nail hallux through fifth toenails Ulcer Exam: There is no evidence of ulcer or pre-ulcerative changes or infection. Orthopedic Exam: Muscle tone and strength are WNL. No limitations in general ROM. No crepitus or effusions noted. Foot type and digits show no abnormalities. Bony prominences are unremarkable. Skin: No Porokeratosis. No infection or ulcers  Diagnosis:  Onychomycosis, , Pain in right toe, pain in left toes  Treatment & Plan Procedures and Treatment: Consent by patient was obtained for treatment procedures. The patient understood the discussion of treatment and procedures well. All questions were answered thoroughly reviewed. Debridement of mycotic and hypertrophic toenails, 1 through 5 bilateral and clearing of subungual debris. No ulceration, no infection noted.  Return Visit-Office Procedure: Patient instructed to return to the office for a follow up visit 3 months for continued evaluation and treatment.    Gardiner Barefoot DPM

## 2015-06-12 ENCOUNTER — Ambulatory Visit: Payer: Medicare Other | Admitting: Podiatry

## 2015-06-29 ENCOUNTER — Ambulatory Visit (INDEPENDENT_AMBULATORY_CARE_PROVIDER_SITE_OTHER): Payer: Medicare Other | Admitting: Allergy and Immunology

## 2015-06-29 ENCOUNTER — Encounter: Payer: Self-pay | Admitting: Allergy and Immunology

## 2015-06-29 VITALS — BP 128/68 | HR 60 | Temp 97.7°F | Resp 16 | Ht 65.35 in | Wt 193.8 lb

## 2015-06-29 DIAGNOSIS — L309 Dermatitis, unspecified: Secondary | ICD-10-CM | POA: Diagnosis not present

## 2015-06-29 DIAGNOSIS — R05 Cough: Secondary | ICD-10-CM | POA: Diagnosis not present

## 2015-06-29 DIAGNOSIS — R059 Cough, unspecified: Secondary | ICD-10-CM

## 2015-06-29 NOTE — Patient Instructions (Addendum)
   Take Home Sheet  1. Avoidance: Mite  And all fragranced soaps/lotion/detergents and scratching.   2. Antihistamine: Zyrtec 10mg  by mouth once to twice daily for runny nose or itching.   3. Nasal Spray: Saline spray(s) each nostril once daily for stuffy nose or drainage.    4. Other: Moisturize skin daily with Vanicream or Cetaphil   5.  Keep diary of new episodes with environment/ingestion/activity and exposure. Evaluate bed for any concerns.  6. Derma-Smoothe Oil  twice daily for rash areas as needed.  7.  Dr. Ishmael Holter' office will call back with information on changing medications as discussed   Once review with Dr. Jeanie Cooks.  8. Follow up Visit:  1-2 months or sooner if needed.   Websites that have reliable Patient information: 1. American Academy of Asthma, Allergy, & Immunology: www.aaaai.org 2. Food Allergy Network: www.foodallergy.org 3. Mothers of Asthmatics: www.aanma.org 4. [place]: DiningCalendar.de 5. American College of Allergy, Asthma, & Immunology: https://robertson.info/ or www.acaai.org

## 2015-06-29 NOTE — Progress Notes (Signed)
NEW PATIENT NOTE  RE: BRAZEN MARSILI MRN: [id number] DOB: [birth year] [facility] [place] [address] [place] [id number] Date of Office Visit: 06/29/2015  Dear Zachary Ebbs, MD:  I had the pleasure of seeing Zachary Hoffman today in initial evaluation as you recall-- Subjective:  ZACKAREE CALLERY is a 75 y.o. male who presents today for Rash  Assessment:   1. Cough, currently asymptomatic with normal in office spirometry and lung exam.   2. Dermatitis, with significant xerosis and post-inflammatory hyperpigmentation, consider bite reaction.    3.      Recent smoking cessation, in the last 2 weeks. 4.      Complex medical history with multiple medication regime including several anti-hypertensives, aspirin and insulin. 5.      History of back pain with intermittent Percocet use.      Plan:   Meds ordered this encounter  Medications  . Fluocinolone Acetonide (DERMA-SMOOTHE/FS BODY) 0.01 % OIL    Sig: Apply twice daily to rash areas as needed.    Dispense:  118 mL    Refill:  2   Patient Instructions  1. Avoidance: Mite And all fragranced soaps/lotion/detergents and scratching. 2. Antihistamine: Zyrtec 10mg  by mouth once to twice daily for runny nose or itching, consistently. 3. Nasal Spray: Saline spray(s) each nostril once daily for stuffy nose or drainage.  4. Moisturize skin daily with Vanicream or Cetaphil 5.  Keep diary of new episodes with environment/ingestion/activity and exposure. Evaluate bed for any concerns. 6. Derma-Smoothe Oil  twice daily for rash areas as needed. 7.  Dr. Ishmael Holter' office will call back with information on changing medications as discussed once reviewed with Dr. Jeanie Cooks for appropriate alternatives. 8. Continue to remain tobacco free. 9. Consider further laboratory evaluation after able to review recent labs from Dr. Santiago Bur visit. 10. Follow up Visit:  1-2 months or sooner if needed.  HPI: Zachary Hoffman, who has no chronic skin  history nor recollection of acute reactions or hives, presents to the office with rash which began November 2016.  He describes small raised lesions usually pea sized typically over his arms which seem to begin with slight erythema which worsen with scratching over several days then begin to "dry up".  He has noted a few areas at shoulders and rare areas at legs.  No facial or trunk skin changes nor swelling or acute associated or preceding respiratory or GI symptoms.  He does not appreciate worsening with specific exposure, ingestion or environment.  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 He has no restrictions to his diet and recalls no recent changes with typical meals. He uses various soap choices--Zest, Dial, [place]; changes between Tide, Fab, All and Arm&Hammer and recently using Shea butter moisturizer. Rarely has any congestion, sneezing, rhinorrhea or recurring allergy season symptoms. He has had an evaluation at [facility] with use of their recommended steroid cream with marginal improvement.  No biopsy.  Denies ED or Urgent care visits, prednisone or antibiotic courses.  Medical History: Past Medical History  Diagnosis Date  . Hypertension   . CHF (congestive heart failure) ([place])     Echo 07/2006 with normal LV fxn, and diastolic dysfxn  . MI (myocardial infarction) ([place])     S/p stenting per patient. Last stress 04/2007 negative   . Peripheral arterial disease ([place])  S/p left femoro-femoral bypass. LE cath by Dr. Einar Gip 09/2007 without significant stenosis  . Tobacco abuse   . Diverticulosis 05/2009    Noted on screening colo   . Coronary artery disease   . Headache(784.0)   . Diabetes mellitus     On insulin   . Neuropathy ([place])   . 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 Dyslipidemia   . Pancreatitis     P/H  .  Asthma   . Eczema    Surgical History: Past Surgical History  Procedure Laterality Date  . Replacement total hip w/  resurfacing implants      Left  . Femoral-femoral bypass graft      In [place], on the left. Patent on cath 09/2007  . Coronary angioplasty with stent placement  2004    Mid LAD, 95% first obtuse marginal   . Doppler echocardiography  07/28/2006    Mod. LVH,EF =>55%,LA mildly dilated,mitral annular ca+,AOV mildly sclerotic,trace AI  . Nuclear stress test  09/17/2010    No ischemia  . Lower extremity angiogram  09/20/2007    no significant stenosis  . Adenoidectomy    . Tonsillectomy     Family History: Family History  Problem Relation Age of Onset  . Stroke Mother   . Diabetes Father   . Cancer Father   . Allergic rhinitis Neg Hx   . Angioedema Neg Hx   . Immunodeficiency Neg Hx   . Urticaria Neg Hx   . Asthma Son   . Multiple sclerosis Daughter   . Eczema Daughter    Social History: Social History  . Marital Status: Married    Spouse Name: N/A  . Number of Children: N/A  . Years of Education: N/A   Social History Main Topics  . Smoking status: Current Some Day Smoker -- 0.50 packs/day for 50 years    Types: Cigarettes  . Smokeless tobacco: Never Used  . Alcohol Use: No  . Drug Use: No  . Sexual Activity: Not Currently   Social History Narrative   Lives with wife, has grown children. Still active without cane or walker.    Thadd has a current medication list which includes the following prescription(s): amlodipine, aspirin, clobetasol cream, clopidogrel, cyclobenzaprine, furosemide, ibuprofen, iron-vitamins, lantus solostar, metoprolol succinate, oxycodone-acetaminophen, pregabalin, simvastatin, vardenafil, varenicline,  cetirizine.   Drug Allergies: No Known Allergies  Environmental History: Hashim lives in a 74year old house for 13 years with linoleum/carpet floors, with central heat and air; stuffed mattress, non-feather pillow/comforter  without humidifier, pets and smokers.   Review of Systems  Constitutional: Negative for fever, chills, weight loss, malaise/fatigue and diaphoresis.  HENT: Positive for congestion. Negative for ear pain, hearing loss, nosebleeds and sore throat.   Eyes: Negative for blurred vision, double vision, discharge and redness.  Respiratory: Negative for shortness of breath.        Denies history of pneumonia. Previous history of bronchitis (used albuterol--none currently).  Gastrointestinal: Negative for heartburn, nausea, vomiting, abdominal pain, diarrhea and constipation.  Genitourinary: Negative.   Musculoskeletal: Negative for myalgias and joint pain.  Skin: Positive for itching and rash.  Neurological: Negative.  Negative for dizziness, seizures, weakness and headaches.  Endo/Heme/Allergies: Positive for environmental allergies.       Denies sensitivity to aspirin, NSAIDs, stinging insects, foods, latex, jewelry and cosmetics.  Immunological: No chronic or recurring infections. Objective:   Filed Vitals:   06/29/15 1135  BP: 128/68  Pulse: 60  Temp:  97.7 F (36.5 C)  Resp: 16   SpO2 Readings from Last 1 Encounters:  06/29/15 98%   Physical Exam  Constitutional: He is well-developed, well-nourished, and in no distress.  HENT:  Head: Atraumatic.  Right Ear: Tympanic membrane and ear canal normal.  Left Ear: Tympanic membrane and ear canal normal.  Nose: Mucosal edema present. No rhinorrhea. No epistaxis.  Mouth/Throat: Oropharynx is clear and moist and mucous membranes are normal. No oropharyngeal exudate, posterior oropharyngeal edema or posterior oropharyngeal erythema.  Eyes: Conjunctivae are normal.  Neck: Neck supple.  Cardiovascular: Normal rate, S1 normal and S2 normal.   No murmur heard. Pulmonary/Chest: Effort normal and breath sounds normal. He has no wheezes. He has no rhonchi. He has no rales.  Abdominal: Soft. Bowel sounds are normal.  Lymphadenopathy:    He has  no cervical adenopathy.  Neurological: He is alert.  Skin: Skin is warm, dry and intact. Rash (generalized dryness with significant macular hyperpigmented areas over bilateral arms, few at shoulders/back and legs, noted previously scratched areas with only a few tiny erythematous areas at left inner forearm.) noted. No cyanosis. Nails show no clubbing.  No lesions at hands.   Diagnostics: Spirometry:  FVC  2.51--101%, FEV1 2.05--105%.    Skin testing:  Deferred today.    Roselyn M. Ishmael Holter, MD   cc: Philis Fendt, MD

## 2015-06-30 MED ORDER — DERMA-SMOOTHE/FS BODY 0.01 % EX OIL: TOPICAL_OIL | CUTANEOUS | Status: DC

## 2015-07-03 ENCOUNTER — Telehealth: Payer: Self-pay | Admitting: Allergy and Immunology

## 2015-07-03 MED ORDER — DERMA-SMOOTHE/FS BODY 0.01 % EX OIL: TOPICAL_OIL | CUTANEOUS | Status: DC

## 2015-07-03 NOTE — Telephone Encounter (Signed)
Pt went to pharmacy and no meds were called in.[phone] or [phone].

## 2015-07-03 NOTE — Telephone Encounter (Signed)
Resent script.  Patient said that pharmacy has not called him to say that it was ready

## 2015-07-24 ENCOUNTER — Ambulatory Visit (INDEPENDENT_AMBULATORY_CARE_PROVIDER_SITE_OTHER): Payer: Medicare Other | Admitting: Podiatry

## 2015-07-24 DIAGNOSIS — R0989 Other specified symptoms and signs involving the circulatory and respiratory systems: Secondary | ICD-10-CM | POA: Diagnosis not present

## 2015-07-24 DIAGNOSIS — M2040 Other hammer toe(s) (acquired), unspecified foot: Secondary | ICD-10-CM | POA: Diagnosis not present

## 2015-07-24 DIAGNOSIS — E1140 Type 2 diabetes mellitus with diabetic neuropathy, unspecified: Secondary | ICD-10-CM | POA: Diagnosis not present

## 2015-07-24 NOTE — Progress Notes (Signed)
Patient ID: Zachary Hoffman, male   DOB: 07-Jun-1940, 75 y.o.   MRN: [id number] Patient presents for diabetic shoe pick up, shoes are tried on for good fit.  Patient received 1 Pair Ortho[employer] [place] [facility] black in men's 10 wide and 3 pairs custom molded diabetic inserts.  Verbal and written break in and wear instructions given.  Patient will follow up for scheduled routine care.

## 2015-07-24 NOTE — Patient Instructions (Signed)

## 2015-09-14 ENCOUNTER — Ambulatory Visit: Payer: Medicare Other | Admitting: Podiatry

## 2015-10-29 ENCOUNTER — Other Ambulatory Visit: Payer: Self-pay | Admitting: Cardiovascular Disease

## 2015-10-29 ENCOUNTER — Ambulatory Visit (HOSPITAL_COMMUNITY): Admission: RE | Admit: 2015-10-29 | Payer: Medicare Other | Source: Ambulatory Visit

## 2015-10-29 DIAGNOSIS — I739 Peripheral vascular disease, unspecified: Secondary | ICD-10-CM

## 2015-11-02 ENCOUNTER — Ambulatory Visit (INDEPENDENT_AMBULATORY_CARE_PROVIDER_SITE_OTHER): Payer: Medicare Other | Admitting: Podiatry

## 2015-11-02 ENCOUNTER — Encounter: Payer: Self-pay | Admitting: Podiatry

## 2015-11-02 DIAGNOSIS — B351 Tinea unguium: Secondary | ICD-10-CM

## 2015-11-02 DIAGNOSIS — E1140 Type 2 diabetes mellitus with diabetic neuropathy, unspecified: Secondary | ICD-10-CM

## 2015-11-02 DIAGNOSIS — M79676 Pain in unspecified toe(s): Secondary | ICD-10-CM | POA: Diagnosis not present

## 2015-11-02 NOTE — Progress Notes (Signed)
Patient is sent to see me with pain in left great toe possibly caused by shoes.  Shoes are tried on and fit is checked no pressure from shoe is resting on the great toe.  A knot can be felt on the lateral side of the left great toe with pain when touched.  Notified Dr Prudence Davidson who dispensed patient a gel pad to protect area.  Recommended patient return for x-rays if pain continues in toe.

## 2015-11-02 NOTE — Progress Notes (Signed)
Patient ID: Zachary Hoffman, male   DOB: 04/26/1940, 75 y.o.   MRN: [id number] Complaint:  Visit Type: Patient returns to my office for continued preventative foot care services. Complaint: Patient states" my nails have grown long and thick and become painful to walk and wear shoes" Patient has been diagnosed with DM with no foot complications. The patient presents for preventative foot care services. No changes to ROS  Podiatric Exam: Vascular: dorsalis pedis and posterior tibial pulses are palpable bilateral. Capillary return is immediate. Temperature gradient is WNL. Skin turgor WNL  Sensorium: Diminished  Semmes Weinstein monofilament test. Normal tactile sensation bilaterally. Nail Exam: Pt has thick disfigured discolored nails with subungual debris noted bilateral entire nail hallux through fifth toenails Ulcer Exam: There is no evidence of ulcer or pre-ulcerative changes or infection. Orthopedic Exam: Muscle tone and strength are WNL. No limitations in general ROM. No crepitus or effusions noted. Foot type and digits show no abnormalities. Bony prominences are unremarkable. Skin: No Porokeratosis. No infection or ulcers  Diagnosis:  Onychomycosis, , Pain in right toe, pain in left toes  Treatment & Plan Procedures and Treatment: Consent by patient was obtained for treatment procedures. The patient understood the discussion of treatment and procedures well. All questions were answered thoroughly reviewed. Debridement of mycotic and hypertrophic toenails, 1 through 5 bilateral and clearing of subungual debris. No ulceration, no infection noted.  Return Visit-Office Procedure: Patient instructed to return to the office for a follow up visit 3 months for continued evaluation and treatment.    Marelin Tat DPM 

## 2015-11-06 ENCOUNTER — Ambulatory Visit (HOSPITAL_COMMUNITY)
Admission: RE | Admit: 2015-11-06 | Discharge: 2015-11-06 | Disposition: A | Discharge status: Home / Self Care | Payer: Medicare Other | Source: Ambulatory Visit | Attending: Cardiovascular Disease | Admitting: Cardiovascular Disease

## 2015-11-06 DIAGNOSIS — N182 Chronic kidney disease, stage 2 (mild): Secondary | ICD-10-CM | POA: Diagnosis not present

## 2015-11-06 DIAGNOSIS — Z720 Tobacco use: Secondary | ICD-10-CM | POA: Insufficient documentation

## 2015-11-06 DIAGNOSIS — I771 Stricture of artery: Secondary | ICD-10-CM | POA: Diagnosis not present

## 2015-11-06 DIAGNOSIS — E785 Hyperlipidemia, unspecified: Secondary | ICD-10-CM | POA: Diagnosis not present

## 2015-11-06 DIAGNOSIS — I509 Heart failure, unspecified: Secondary | ICD-10-CM | POA: Insufficient documentation

## 2015-11-06 DIAGNOSIS — I2510 Atherosclerotic heart disease of native coronary artery without angina pectoris: Secondary | ICD-10-CM | POA: Diagnosis not present

## 2015-11-06 DIAGNOSIS — E1142 Type 2 diabetes mellitus with diabetic polyneuropathy: Secondary | ICD-10-CM | POA: Diagnosis not present

## 2015-11-06 DIAGNOSIS — I739 Peripheral vascular disease, unspecified: Secondary | ICD-10-CM | POA: Diagnosis not present

## 2015-11-06 DIAGNOSIS — E1122 Type 2 diabetes mellitus with diabetic chronic kidney disease: Secondary | ICD-10-CM | POA: Diagnosis not present

## 2015-11-06 DIAGNOSIS — I130 Hypertensive heart and chronic kidney disease with heart failure and stage 1 through stage 4 chronic kidney disease, or unspecified chronic kidney disease: Secondary | ICD-10-CM | POA: Insufficient documentation

## 2015-11-14 ENCOUNTER — Telehealth: Payer: Self-pay | Admitting: Cardiovascular Disease

## 2015-11-14 NOTE — Telephone Encounter (Signed)
Results discussed w patient. He verbalized understanding and thanks for the call. Aware to call if further needs.

## 2015-11-14 NOTE — Telephone Encounter (Signed)
New message       Returning a call to get test results

## 2015-12-05 ENCOUNTER — Ambulatory Visit (INDEPENDENT_AMBULATORY_CARE_PROVIDER_SITE_OTHER): Payer: Medicare Other | Admitting: Podiatry

## 2015-12-05 ENCOUNTER — Ambulatory Visit (INDEPENDENT_AMBULATORY_CARE_PROVIDER_SITE_OTHER): Payer: Medicare Other

## 2015-12-05 ENCOUNTER — Encounter: Payer: Self-pay | Admitting: Podiatry

## 2015-12-05 VITALS — BP 146/82 | HR 67 | Resp 16

## 2015-12-05 DIAGNOSIS — M79671 Pain in right foot: Secondary | ICD-10-CM | POA: Diagnosis not present

## 2015-12-05 DIAGNOSIS — M25571 Pain in right ankle and joints of right foot: Secondary | ICD-10-CM

## 2015-12-05 DIAGNOSIS — M779 Enthesopathy, unspecified: Secondary | ICD-10-CM

## 2015-12-06 DIAGNOSIS — M779 Enthesopathy, unspecified: Secondary | ICD-10-CM | POA: Diagnosis not present

## 2015-12-06 MED ORDER — TRIAMCINOLONE ACETONIDE 10 MG/ML IJ SUSP
10.0000 mg | Freq: Once | INTRAMUSCULAR | Status: AC
  Administered 2015-12-06: 10 mg

## 2015-12-06 NOTE — Progress Notes (Signed)
Subjective:     Patient ID: Zachary Hoffman, male   DOB: June 16, 1940, 75 y.o.   MRN: [id number]  HPI patient points to right foot states it's starting to hurt him and makes wearing shoe gear difficult   Review of Systems     Objective:   Physical Exam Neurovascular status unchanged with inflammation dorsal right foot with fluid buildup and no indications of edema erythema or breakage of tissue. Patient's found to have good digital perfusion well oriented 3    Assessment:     Inflammatory tendinitis right and cannot rule out chances for stress fracture    Plan:     H&P x-rays reviewed and careful dorsal injection administered 3 mg Kenalog 5 mg Xylocaine and advised on heat ice and shoes that are not laced too tightly. Reappoint to recheck again as needed  X-ray report was negative for signs of fracture and did indicate arthritis

## 2016-02-08 ENCOUNTER — Ambulatory Visit (INDEPENDENT_AMBULATORY_CARE_PROVIDER_SITE_OTHER): Payer: Medicare Other | Admitting: Podiatry

## 2016-02-08 ENCOUNTER — Encounter: Payer: Self-pay | Admitting: Podiatry

## 2016-02-08 VITALS — Ht 65.0 in | Wt 193.0 lb

## 2016-02-08 DIAGNOSIS — M79676 Pain in unspecified toe(s): Secondary | ICD-10-CM | POA: Diagnosis not present

## 2016-02-08 DIAGNOSIS — E1140 Type 2 diabetes mellitus with diabetic neuropathy, unspecified: Secondary | ICD-10-CM

## 2016-02-08 DIAGNOSIS — B351 Tinea unguium: Secondary | ICD-10-CM

## 2016-02-08 NOTE — Progress Notes (Signed)
Patient ID: Zachary Hoffman, male   DOB: 29-Apr-1940, 75 y.o.   MRN: [id number] Complaint:  Visit Type: Patient returns to my office for continued preventative foot care services. Complaint: Patient states" my nails have grown long and thick and become painful to walk and wear shoes" Patient has been diagnosed with DM with no foot complications. The patient presents for preventative foot care services. No changes to ROS  Podiatric Exam: Vascular: dorsalis pedis and posterior tibial pulses are palpable bilateral. Capillary return is immediate. Temperature gradient is WNL. Skin turgor WNL  Sensorium: Diminished  Semmes Weinstein monofilament test. Normal tactile sensation bilaterally. Nail Exam: Pt has thick disfigured discolored nails with subungual debris noted bilateral entire nail hallux through fifth toenails Ulcer Exam: There is no evidence of ulcer or pre-ulcerative changes or infection. Orthopedic Exam: Muscle tone and strength are WNL. No limitations in general ROM. No crepitus or effusions noted. Foot type and digits show no abnormalities. Bony prominences are unremarkable. Skin: No Porokeratosis. No infection or ulcers  Diagnosis:  Onychomycosis, , Pain in right toe, pain in left toes  Treatment & Plan Procedures and Treatment: Consent by patient was obtained for treatment procedures. The patient understood the discussion of treatment and procedures well. All questions were answered thoroughly reviewed. Debridement of mycotic and hypertrophic toenails, 1 through 5 bilateral and clearing of subungual debris. No ulceration, no infection noted.  Return Visit-Office Procedure: Patient instructed to return to the office for a follow up visit 3 months for continued evaluation and treatment.    Gardiner Barefoot DPM

## 2016-05-09 ENCOUNTER — Ambulatory Visit: Payer: Medicare Other | Admitting: Podiatry

## 2016-05-27 ENCOUNTER — Ambulatory Visit (INDEPENDENT_AMBULATORY_CARE_PROVIDER_SITE_OTHER): Payer: Medicare Other | Admitting: Podiatry

## 2016-05-27 DIAGNOSIS — M79676 Pain in unspecified toe(s): Secondary | ICD-10-CM

## 2016-05-27 DIAGNOSIS — B351 Tinea unguium: Secondary | ICD-10-CM

## 2016-05-27 DIAGNOSIS — E1140 Type 2 diabetes mellitus with diabetic neuropathy, unspecified: Secondary | ICD-10-CM

## 2016-05-27 NOTE — Progress Notes (Signed)
Patient ID: Zachary Hoffman, male   DOB: 01-21-1941, 76 y.o.   MRN: [id number] Complaint:  Visit Type: Patient returns to my office for continued preventative foot care services. Complaint: Patient states" my nails have grown long and thick and become painful to walk and wear shoes" Patient has been diagnosed with DM with no foot complications. The patient presents for preventative foot care services. No changes to ROS  Podiatric Exam: Vascular: dorsalis pedis and posterior tibial pulses are palpable bilateral. Capillary return is immediate. Temperature gradient is WNL. Skin turgor WNL  Sensorium: Diminished  Semmes Weinstein monofilament test. Normal tactile sensation bilaterally. Nail Exam: Pt has thick disfigured discolored nails with subungual debris noted bilateral entire nail hallux through fifth toenails Ulcer Exam: There is no evidence of ulcer or pre-ulcerative changes or infection. Orthopedic Exam: Muscle tone and strength are WNL. No limitations in general ROM. No crepitus or effusions noted. Foot type and digits show no abnormalities. Bony prominences are unremarkable. Skin: No Porokeratosis. No infection or ulcers  Diagnosis:  Onychomycosis, , Pain in right toe, pain in left toes  Treatment & Plan Procedures and Treatment: Consent by patient was obtained for treatment procedures. The patient understood the discussion of treatment and procedures well. All questions were answered thoroughly reviewed. Debridement of mycotic and hypertrophic toenails, 1 through 5 bilateral and clearing of subungual debris. No ulceration, no infection noted.  Return Visit-Office Procedure: Patient instructed to return to the office for a follow up visit 3 months for continued evaluation and treatment.    Gardiner Barefoot DPM

## 2016-08-26 ENCOUNTER — Ambulatory Visit (INDEPENDENT_AMBULATORY_CARE_PROVIDER_SITE_OTHER): Payer: Medicare Other | Admitting: Podiatry

## 2016-08-26 ENCOUNTER — Encounter: Payer: Self-pay | Admitting: Podiatry

## 2016-08-26 DIAGNOSIS — M79676 Pain in unspecified toe(s): Secondary | ICD-10-CM

## 2016-08-26 DIAGNOSIS — M129 Arthropathy, unspecified: Secondary | ICD-10-CM

## 2016-08-26 DIAGNOSIS — E1140 Type 2 diabetes mellitus with diabetic neuropathy, unspecified: Secondary | ICD-10-CM

## 2016-08-26 DIAGNOSIS — B351 Tinea unguium: Secondary | ICD-10-CM

## 2016-08-26 NOTE — Progress Notes (Signed)
Patient ID: Zachary Hoffman, male   DOB: 1940-11-16, 76 y.o.   MRN: [id number] Complaint:  Visit Type: Patient returns to my office for continued preventative foot care services. Complaint: Patient states" my nails have grown long and thick and become painful to walk and wear shoes" Patient has been diagnosed with DM with no foot complications. The patient presents for preventative foot care services. No changes to ROS  Podiatric Exam: Vascular: dorsalis pedis and posterior tibial pulses are palpable bilateral. Capillary return is immediate. Temperature gradient is WNL. Skin turgor WNL  Sensorium: Diminished  Semmes Weinstein monofilament test. Normal tactile sensation bilaterally. Nail Exam: Pt has thick disfigured discolored nails with subungual debris noted bilateral entire nail hallux through fifth toenails Ulcer Exam: There is no evidence of ulcer or pre-ulcerative changes or infection. Orthopedic Exam: Muscle tone and strength are WNL. No limitations in general ROM. No crepitus or effusions noted. 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 Bony prominences are unremarkable. Skin: No Porokeratosis. No infection or ulcers  Diagnosis:  Onychomycosis, , Pain in right toe, pain in left toes  Treatment & Plan Procedures and Treatment: Consent by patient was obtained for treatment procedures. The patient understood the discussion of treatment and procedures well. All questions were answered thoroughly reviewed. Debridement of mycotic and hypertrophic toenails, 1 through 5 bilateral and clearing of subungual debris. No ulceration, no infection noted.  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   Return Visit-Office Procedure: Patient instructed to return to the office for a follow up visit 3 months for continued evaluation and treatment.    Gardiner Barefoot DPM

## 2016-10-01 ENCOUNTER — Ambulatory Visit: Payer: Medicare Other | Admitting: Podiatry

## 2016-10-07 ENCOUNTER — Ambulatory Visit (INDEPENDENT_AMBULATORY_CARE_PROVIDER_SITE_OTHER): Payer: Medicare Other | Admitting: Podiatry

## 2016-10-07 DIAGNOSIS — M2040 Other hammer toe(s) (acquired), unspecified foot: Secondary | ICD-10-CM | POA: Diagnosis not present

## 2016-10-07 DIAGNOSIS — E1140 Type 2 diabetes mellitus with diabetic neuropathy, unspecified: Secondary | ICD-10-CM | POA: Diagnosis not present

## 2016-10-07 DIAGNOSIS — M129 Arthropathy, unspecified: Secondary | ICD-10-CM

## 2016-10-07 DIAGNOSIS — I739 Peripheral vascular disease, unspecified: Secondary | ICD-10-CM

## 2016-10-07 DIAGNOSIS — R0989 Other specified symptoms and signs involving the circulatory and respiratory systems: Secondary | ICD-10-CM | POA: Diagnosis not present

## 2016-10-07 NOTE — Progress Notes (Signed)
Patient presents to pick up his diabetic shoes.  LT210M and inserts.GENERAL APPEARANCE: Alert, conversant. Appropriately groomed. No acute distress.  VASCULAR: Pedal pulses are  palpable at  [facility] and PT bilateral.  Capillary refill time is immediate to all digits,  Normal temperature gradient.  Digital hair growth is present bilateral  NEUROLOGIC: sensation is diminished  to 5.07 monofilament at 5/5 sites bilateral.  Light touch is intact bilateral, Muscle strength normal.  MUSCULOSKELETAL: acceptable muscle strength, tone and stability bilateral.  Intrinsic muscluature intact bilateral.  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  DERMATOLOGIC: skin color, texture, and turgor are within normal limits.  No preulcerative lesions or ulcers  are seen, no interdigital maceration noted.  No open lesions present.  Digital nails are asymptomatic. No drainage noted.   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  Patient presents today and was dispensed 0ne pair ( two units) of medically necessary extra depth shoes with three pair( six units) of custom molded multiple density inserts. The shoes and the inserts are fitted to the patients ' feet and are noted to fit well and are free of defect.  Length and width of the shoes are also acceptable.  Patient was given written and verbal  instructions for wearing.  If any concerns arrive with the shoes or inserts, the patient is to call the office.Patient is to follow up with doctor in six weeks.   Gardiner Barefoot DPM

## 2016-10-31 ENCOUNTER — Encounter: Payer: Self-pay | Admitting: Cardiovascular Disease

## 2016-10-31 ENCOUNTER — Ambulatory Visit (INDEPENDENT_AMBULATORY_CARE_PROVIDER_SITE_OTHER): Payer: Medicare Other | Admitting: Cardiovascular Disease

## 2016-10-31 VITALS — BP 112/60 | HR 66 | Ht 65.0 in | Wt 192.4 lb

## 2016-10-31 DIAGNOSIS — Z794 Long term (current) use of insulin: Secondary | ICD-10-CM | POA: Diagnosis not present

## 2016-10-31 DIAGNOSIS — E1122 Type 2 diabetes mellitus with diabetic chronic kidney disease: Secondary | ICD-10-CM

## 2016-10-31 DIAGNOSIS — I2510 Atherosclerotic heart disease of native coronary artery without angina pectoris: Secondary | ICD-10-CM | POA: Diagnosis not present

## 2016-10-31 DIAGNOSIS — Z720 Tobacco use: Secondary | ICD-10-CM | POA: Diagnosis not present

## 2016-10-31 DIAGNOSIS — Z79899 Other long term (current) drug therapy: Secondary | ICD-10-CM

## 2016-10-31 DIAGNOSIS — N183 Chronic kidney disease, stage 3 (moderate): Secondary | ICD-10-CM

## 2016-10-31 DIAGNOSIS — I739 Peripheral vascular disease, unspecified: Secondary | ICD-10-CM

## 2016-10-31 DIAGNOSIS — E785 Hyperlipidemia, unspecified: Secondary | ICD-10-CM

## 2016-10-31 NOTE — Progress Notes (Signed)
Cardiology Office Note:    Date:  10/31/2016   ID:  Zachary Hoffman, DOB 01-13-1941, MRN [id number]  PCP:  [name], MD  Cardiologist:  Sanda Klein, MD    Referring MD: [name], MD   Chief complaint: coronary And peripheral artery disease f/u  History of Present Illness:    Zachary Hoffman is a 76 y.o. male with a hx of CAD , PAD , ongoing tobacco abuse, obesity and obstructive sleep apnea , chronic kidney disease stage III related to diabetes mellitus on insulin therapy.  He has generally done well denying any problems with dyspnea or angina with activity. He does not have lower extremity edema and does not have typical intermittent claudication. He does describe some buttock discomfort on the left side when he walks, but this sounds more musculoskeletal than vascular. He has not had any focal neurological events and has not had hypoglycemia leading to syncope, although he has occasional low blood sugars. He does not know what his most recent hemoglobin A1c was. He continues to see Dr. Florene Glen for renal insufficiency and has been told that his renal parameters are stable.  Unfortunately Kailin is still smoking. A pack of cigarettes lasts for 1-1/2 days. This is a little more than last year. He spokes both at work and at home. Could not convince him to commit to a plan for smoking cessation today.  He has an extensive history of peripheral arterial disease. In 2009 he had a left common femoral to superficial femoral artery bypass procedure for occlusive disease. He also has a history of coronary artery disease and has previously received a stent to the LAD (2004, [place], no details known). His most recent lower extremity arterial Doppler study was performed in August 2017. The femoral to femoral bypass graft was widely patent with normal velocities, three-vessel runoff. ABI normal bilaterally. On the right side the anterior tibial artery is occluded. Normal LV function by  nuclear stress in 2012: EF 64%, normal myocardial perfusion.  Past Medical History:  Diagnosis Date  . Asthma   . CHF (congestive heart failure) ([place])    Echo 07/2006 with normal LV fxn, and diastolic dysfxn  . Coronary artery disease   . Diabetes mellitus    On insulin   . Diverticulosis 05/2009   Noted on screening colo   . Dyslipidemia   . Eczema   . Headache(784.0)   . Hypertension   . 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 MI (myocardial infarction) ([place])    S/p stenting per patient. Last stress 04/2007 negative   . Neuropathy   . Pancreatitis    P/H  . Peripheral arterial disease ([facility])    S/p left femoro-femoral bypass. LE cath by Dr. Einar Gip 09/2007 without significant stenosis  . Tobacco abuse     Past Surgical History:  Procedure Laterality Date  . ADENOIDECTOMY    . CORONARY ANGIOPLASTY WITH STENT PLACEMENT  2004   Mid LAD, 95% first obtuse marginal   . DOPPLER ECHOCARDIOGRAPHY  07/28/2006   Mod. LVH,EF =>55%,LA mildly dilated,mitral annular ca+,AOV mildly sclerotic,trace AI  . FEMORAL-FEMORAL BYPASS GRAFT     In [place], on the left. Patent on cath 09/2007  . LOWER EXTREMITY ANGIOGRAM  09/20/2007   no significant stenosis  . Nuclear Stress Test  09/17/2010   No ischemia  . REPLACEMENT TOTAL HIP W/  RESURFACING IMPLANTS     Left  . TONSILLECTOMY  Current Medications: Current Meds  Medication Sig  . albuterol (PROVENTIL HFA;VENTOLIN HFA) 108 (90 BASE) MCG/ACT inhaler Inhale 2 puffs into the lungs every 6 (six) hours as needed for wheezing or shortness of breath. Reported on 06/29/2015  . amLODipine (NORVASC) 10 MG tablet Take 10 mg by mouth daily.  Marland Kitchen aspirin 325 MG tablet Take 325 mg by mouth daily.  Marland Kitchen bismuth subsalicylate (PEPTO BISMOL) 262 MG/15ML suspension Take 30 mLs by mouth every 6 (six) hours as needed (heartburn). Reported on 06/29/2015  . cetirizine (ZYRTEC) 10 MG tablet TAKE 1 TABLET EVERY DAY AS NEEDED  . clobetasol cream  (TEMOVATE) 0.05 % APPLY TO AFFECTED AREA DAILY AS NEEDED  . clopidogrel (PLAVIX) 75 MG tablet Take 75 mg by mouth daily.  . cyclobenzaprine (FLEXERIL) 10 MG tablet Take 10 mg by mouth 3 (three) times daily as needed for muscle spasms.  Marland Kitchen dexlansoprazole (DEXILANT) 60 MG capsule Take 60 mg by mouth daily. Reported on 06/29/2015  . doxazosin (CARDURA) 1 MG tablet Take 2 mg by mouth at bedtime.  . Fluocinolone Acetonide (DERMA-SMOOTHE/FS BODY) 0.01 % OIL Apply twice daily to rash areas as needed.  . fluticasone (FLONASE) 50 MCG/ACT nasal spray Place 2 sprays into both nostrils daily.  . furosemide (LASIX) 40 MG tablet Take 1 tablet by mouth daily.  Marland Kitchen ibuprofen (ADVIL,MOTRIN) 200 MG tablet Take 400 mg by mouth every 6 (six) hours as needed for moderate pain.  Marland Kitchen insulin glulisine (APIDRA) 100 UNIT/ML injection Inject 10 Units into the skin daily. Reported on 06/29/2015  . Iron-Vitamins (GERITOL PO) Take 1 capsule by mouth every morning.  Marland Kitchen LANTUS SOLOSTAR 100 UNIT/ML Solostar Pen Inject 50 Units into the skin daily.  . methocarbamol (ROBAXIN) 500 MG tablet Take 500 mg by mouth daily as needed. Reported on 06/29/2015  . metoprolol succinate (TOPROL-XL) 100 MG 24 hr tablet Take 50 mg by mouth daily.  . niacin (NIASPAN) 500 MG CR tablet Take 500 mg by mouth every morning. Reported on 06/29/2015  . OMEGA-3 KRILL OIL PO Take 1 capsule by mouth every morning. Reported on 06/29/2015  . oxyCODONE-acetaminophen (PERCOCET/ROXICET) 5-325 MG per tablet Take 1-2 tablets by mouth every 6 (six) hours as needed for severe pain.  . pantoprazole (PROTONIX) 40 MG tablet Take 40 mg by mouth daily as needed. Reported on 06/29/2015  . pregabalin (LYRICA) 150 MG capsule Take 150 mg by mouth 3 (three) times daily.  . quinapril (ACCUPRIL) 40 MG tablet Take 40 mg by mouth daily. Reported on 06/29/2015  . simvastatin (ZOCOR) 20 MG tablet Take 20 mg by mouth every evening.     Allergies:   Patient has no known allergies.   Social  History   Social History  . Marital status: Married    Spouse name: N/A  . Number of children: N/A  . Years of education: N/A   Social History Main Topics  . Smoking status: Current Some Day Smoker    Packs/day: 0.50    Years: 50.00    Types: Cigarettes  . Smokeless tobacco: Never Used  . Alcohol use No  . Drug use: No  . Sexual activity: Not Currently   Other Topics Concern  . Not on file   Social History Narrative   Lives with wife, has grown children. Still active without cane or walker.      Family History: The patient's family history includes Asthma in his son; Cancer in his father; Diabetes in his father; Eczema in his daughter; Multiple sclerosis  in his daughter; Stroke in his mother. There is no history of Allergic rhinitis, Angioedema, Immunodeficiency, or Urticaria. ROS:   Please see the history of present illness.     All other systems reviewed and are negative.  EKGs/Labs/Other Studies Reviewed:     EKG:  EKG is  ordered today.  The ekg ordered today demonstrates Normal sinus rhythm, minor nonspecific T-wave changes, QTc 429 ms  Recent Labs: No results found for requested labs within last 8760 hours.  Recent Lipid Panel    Component Value Date/Time   CHOL 108 (L) 02/22/2015 1222   TRIG 98 02/22/2015 1222   HDL 23 (L) 02/22/2015 1222   CHOLHDL 4.7 02/22/2015 1222   VLDL 20 02/22/2015 1222   LDLCALC 65 02/22/2015 1222    Physical Exam:    VS:  BP 112/60   Pulse 66   Ht 5\' 5"  (1.651 m)   Wt 192 lb 6.4 oz (87.3 kg)   BMI 32.02 kg/m     Wt Readings from Last 3 Encounters:  10/31/16 192 lb 6.4 oz (87.3 kg)  02/08/16 193 lb (87.5 kg)  06/29/15 193 lb 12.6 oz (87.9 kg)     GEN:  Well nourished, well developed in no acute distress HEENT: Normal NECK: No JVD; No carotid bruits LYMPHATICS: No lymphadenopathy CARDIAC: RRR, no murmurs, rubs, gallops RESPIRATORY:  Clear to auscultation without rales, wheezing or rhonchi  ABDOMEN: Soft, non-tender,  non-distended MUSCULOSKELETAL:  No edema; No deformity  SKIN: Warm and dry NEUROLOGIC:  Alert and oriented x 3 PSYCHIATRIC:  Normal affect   ASSESSMENT:    1. Coronary artery disease involving native coronary artery of native heart without angina pectoris   2. PVD (peripheral vascular disease)- Hx F-F in [place], last dopplers OK 6/13   3. Dyslipidemia   4. Tobacco abuse   5. Type 2 diabetes mellitus with stage 3 chronic kidney disease, with long-term current use of insulin ([place])   6. Medication management    PLAN:    In order of problems listed above:  1. CAD: s/p remote LAD stent. Asymptomatic. Normal nuclear study in 2012. Normal LVEF. At this point the focuses on risk factor modification.  2. PAD: History of left proximal to distal superficial femoral artery bypass, patent by last Doppler study a year ago. Repeated next month. Does not have clear intermittent claudication today. No history of common iliac artery stenoses that could cause buttock claudication, will review on upcoming Doppler study. 3. Hyperlipidemia. Time to repeat his lipid profile 4. Ongoing tobacco abuse: reviewed the fact that stopping smoking with the single most important intervention to prevent future coronary and vascular problems. He doesn't feel ready to quit smoking. 5. Insulin-requiring diabetes mellitus: We'll recheck his A1c when we get his lipid profile 6. Chronic kidney disease stage III, seeing Dr. Florene Glen   Medication Adjustments/Labs and Tests Ordered: Current medicines are reviewed at length with the patient today.  Concerns regarding medicines are outlined above.  Orders Placed This Encounter  Procedures  . Comprehensive metabolic panel  . Lipid panel  . Hemoglobin A1c  . EKG 12-Lead   No orders of the defined types were placed in this encounter.   Signed, Sanda Klein, MD  10/31/2016 11:26 AM    [facility]

## 2016-10-31 NOTE — Patient Instructions (Signed)
Medication Instructions: Dr Sallyanne Kuster recommends that you continue on your current medications as directed. Please refer to the Current Medication list given to you today.  Labwork: Your physician recommends that you return for lab work at your earliest [place].  Testing/Procedures: 1. Lower Extremity Arterial dopplers - Your physician has requested that you have a lower extremity arterial duplex. This test is an ultrasound of the arteries in the legs. It looks at arterial blood flow in the legs. Allow one hour for Lower Arterial scans. There are no restrictions or special instructions  Follow-up: Dr Sallyanne Kuster recommends that you schedule a follow-up appointment in 12 months. You will receive a reminder letter in the mail two months in advance. If you don't receive a letter, please call our office to schedule the follow-up appointment.  If you need a refill on your cardiac medications before your next appointment, please call your pharmacy.

## 2016-11-01 LAB — COMPREHENSIVE METABOLIC PANEL
A/G RATIO: 1.2 (ref 1.2–2.2)
ALBUMIN: 3.8 g/dL (ref 3.5–4.8)
ALT: 14 IU/L (ref 0–44)
AST: 19 IU/L (ref 0–40)
Alkaline Phosphatase: 100 IU/L (ref 39–117)
BUN/Creatinine Ratio: 17 (ref 10–24)
BUN: 32 mg/dL — ABNORMAL HIGH (ref 8–27)
Bilirubin Total: 0.3 mg/dL (ref 0.0–1.2)
CALCIUM: 9.5 mg/dL (ref 8.6–10.2)
CO2: 22 mmol/L (ref 20–29)
CREATININE: 1.88 mg/dL — AB (ref 0.76–1.27)
Chloride: 104 mmol/L (ref 96–106)
GFR calc non Af Amer: 34 mL/min/{1.73_m2} — ABNORMAL LOW (ref >59)
GFR, EST AFRICAN AMERICAN: 40 mL/min/{1.73_m2} — AB (ref >59)
GLOBULIN, TOTAL: 3.1 g/dL (ref 1.5–4.5)
Glucose: 164 mg/dL — ABNORMAL HIGH (ref 65–99)
POTASSIUM: 4.7 mmol/L (ref 3.5–5.2)
SODIUM: 142 mmol/L (ref 134–144)
TOTAL PROTEIN: 6.9 g/dL (ref 6.0–8.5)

## 2016-11-01 LAB — HEMOGLOBIN A1C
Est. average glucose Bld gHb Est-mCnc: 194 mg/dL
HEMOGLOBIN A1C: 8.4 % — AB (ref 4.8–5.6)

## 2016-11-01 LAB — LIPID PANEL
CHOL/HDL RATIO: 4.2 ratio (ref 0.0–5.0)
Cholesterol, Total: 100 mg/dL (ref 100–199)
HDL: 24 mg/dL — ABNORMAL LOW (ref >39)
LDL CALC: 62 mg/dL (ref 0–99)
Triglycerides: 70 mg/dL (ref 0–149)
VLDL Cholesterol Cal: 14 mg/dL (ref 5–40)

## 2016-11-03 ENCOUNTER — Telehealth: Payer: Self-pay | Admitting: Cardiovascular Disease

## 2016-11-03 NOTE — Telephone Encounter (Signed)
New message    Pt is calling stating he is returning a call. Please call.

## 2016-11-03 NOTE — Telephone Encounter (Signed)
Patient called with lab results.   Notes recorded by Sanda Klein, MD on 11/03/2016 at 10:01 AM EDT Kidney function tests little worse than baseline, lab suggested because he might be a little "dry" at the time of blood draw. Blood sugar control is not optimal. A1c 8.4% is too high. Cholesterol numbers are similar to their usual trend. LDL cholesterol is excellent, but HDL is low. HDL will not improve without weight loss and more physical exercise. Labs forwarded to Dr. Florene Glen and PCP

## 2016-11-18 ENCOUNTER — Ambulatory Visit: Payer: Medicare Other | Admitting: Podiatry

## 2017-01-09 ENCOUNTER — Other Ambulatory Visit: Payer: Self-pay | Admitting: Cardiovascular Disease

## 2017-01-09 DIAGNOSIS — I739 Peripheral vascular disease, unspecified: Secondary | ICD-10-CM

## 2017-01-12 ENCOUNTER — Ambulatory Visit (HOSPITAL_COMMUNITY)
Admission: RE | Admit: 2017-01-12 | Discharge: 2017-01-12 | Disposition: A | Discharge status: Home / Self Care | Payer: Medicare Other | Source: Ambulatory Visit | Attending: Cardiology | Admitting: Cardiology

## 2017-01-12 DIAGNOSIS — Z794 Long term (current) use of insulin: Secondary | ICD-10-CM | POA: Diagnosis not present

## 2017-01-12 DIAGNOSIS — E119 Type 2 diabetes mellitus without complications: Secondary | ICD-10-CM | POA: Diagnosis not present

## 2017-01-12 DIAGNOSIS — I2510 Atherosclerotic heart disease of native coronary artery without angina pectoris: Secondary | ICD-10-CM | POA: Insufficient documentation

## 2017-01-12 DIAGNOSIS — I10 Essential (primary) hypertension: Secondary | ICD-10-CM | POA: Insufficient documentation

## 2017-01-12 DIAGNOSIS — I739 Peripheral vascular disease, unspecified: Secondary | ICD-10-CM | POA: Insufficient documentation

## 2017-01-12 DIAGNOSIS — F17200 Nicotine dependence, unspecified, uncomplicated: Secondary | ICD-10-CM | POA: Diagnosis not present

## 2017-02-25 ENCOUNTER — Ambulatory Visit: Payer: Medicare Other | Admitting: Podiatry

## 2017-03-20 ENCOUNTER — Encounter: Payer: Self-pay | Admitting: Podiatry

## 2017-03-20 ENCOUNTER — Ambulatory Visit (INDEPENDENT_AMBULATORY_CARE_PROVIDER_SITE_OTHER): Payer: Medicare Other | Admitting: Podiatry

## 2017-03-20 ENCOUNTER — Ambulatory Visit (INDEPENDENT_AMBULATORY_CARE_PROVIDER_SITE_OTHER): Payer: Medicare Other

## 2017-03-20 DIAGNOSIS — M129 Arthropathy, unspecified: Secondary | ICD-10-CM

## 2017-03-20 DIAGNOSIS — B351 Tinea unguium: Secondary | ICD-10-CM | POA: Diagnosis not present

## 2017-03-20 DIAGNOSIS — M79676 Pain in unspecified toe(s): Secondary | ICD-10-CM

## 2017-03-20 DIAGNOSIS — E1140 Type 2 diabetes mellitus with diabetic neuropathy, unspecified: Secondary | ICD-10-CM

## 2017-03-20 MED ORDER — MELOXICAM 15 MG PO TABS: 15.0000 mg | ORAL_TABLET | Freq: Every day | ORAL | 0 refills | Status: DC

## 2017-03-22 ENCOUNTER — Encounter: Payer: Self-pay | Admitting: Podiatry

## 2017-03-22 NOTE — Progress Notes (Addendum)
This patient presents for evaluation care for his long painful nails.  Patient states that he's had pain walking and wearing his shoes.  He also is a diabetic who has received diabetic shoes.  He says the shoes are too big and he has difficulty wearing the shoes..  E also relates having pain in his right ankle.  He says his right ankle appears to walk as he is walking causing pain and discomfort.  He denies any trauma or injury to the right ankle.  He has previously been treated for pain in his right ankle through this office.  He also is taking Plavix and has kidney disease.  He presents the office today for an evaluation and treatment of his feet. In August 2017. He was evaluated by Dr. Paulla Dolly who noted arthritis in his right  foot and ankle   General Appearance  Alert, conversant and in no acute stress.  Vascular  Dorsalis pedis and posterior pulses are palpable  bilaterally.  Capillary return is within normal limits  Bilaterally. Temperature is within normal limits  Bilaterally  Neurologic  Senn-Weinstein monofilament wire test diminished   bilaterally. Muscle power  within normal limits bilaterally.  Nails Thick disfigured discolored nails with subungual debris bilaterally from hallux to fifth toes bilaterally. No evidence of bacterial infection or drainage bilaterally.  Orthopedic  No limitations of motion of motion feet bilaterally.  No crepitus or effusions noted.  No bony pathology or digital deformities noted. 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  Palpable pain noted at the anterior aspect of the right ankle.  No pain upon dorsiflexion and plantarflexion of the foot.  Skin  normotropic skin with no porokeratosis noted bilaterally.  No signs of infections or ulcers noted.     Onychomycosis B/L  Ankle arthritis riight ankle.    ROV  Debridement of nails x 10.  X-rays were taken and do reveal mild changes to the  cartilage at the talar dome, right ankle.  Discussed this right ankle pain with this patient.   He experiences more pain during gait.  Upon clinical examination, there is minimal swelling or increased temperature noted right ankle. I recommended that he limit his anti-inflammatory usage due to his kidney disease.  He was told to cut his 15 mg Mobic in half and only take for 3 days and reevaluate his condition .  I also discussed the possibility of injecting his right ankle to help eliminate his right ankle pain.  He was also told he needs to make an appointment with Liliane Channel  to have his diabetic  shoes adjusted and evaluated.  Return to clinic when necessary for ankle pain.  Return to clinic for an appointment with [facility].  Return to the office in 10-12 weeks for preventative foot care services. ABN signed for 2018.   Gardiner Barefoot DPM

## 2017-03-27 ENCOUNTER — Ambulatory Visit: Payer: Medicare Other | Admitting: Podiatry

## 2017-04-03 ENCOUNTER — Encounter: Payer: Self-pay | Admitting: Podiatry

## 2017-04-03 ENCOUNTER — Ambulatory Visit (INDEPENDENT_AMBULATORY_CARE_PROVIDER_SITE_OTHER): Payer: Medicare Other | Admitting: Podiatry

## 2017-04-03 DIAGNOSIS — M25571 Pain in right ankle and joints of right foot: Secondary | ICD-10-CM

## 2017-04-03 DIAGNOSIS — M129 Arthropathy, unspecified: Secondary | ICD-10-CM | POA: Diagnosis not present

## 2017-04-03 DIAGNOSIS — G8929 Other chronic pain: Secondary | ICD-10-CM | POA: Diagnosis not present

## 2017-04-03 DIAGNOSIS — E1140 Type 2 diabetes mellitus with diabetic neuropathy, unspecified: Secondary | ICD-10-CM | POA: Diagnosis not present

## 2017-04-03 DIAGNOSIS — I2510 Atherosclerotic heart disease of native coronary artery without angina pectoris: Secondary | ICD-10-CM | POA: Diagnosis not present

## 2017-04-03 NOTE — Progress Notes (Signed)
This patient presents the office for continued evaluation of his right ankle.  He says that the ankle is painful walking and wearing his shoes.  He says he did not make an appointment with Liliane Channel for an evaluation of his diabetic shoes.  My evaluation last week revealed ankle arthritis to the right ankle.  I cautioned this patient to take half of a 15 mg Mobic and then checked his ankle status.  He said after 1 medication. He discontinued the Mobic due to his kidney problems.  . Therefore, he presents the office today stating that he has about the same level of pain in his right ankle.  Marland Kitchen He presents the office for continued evaluation and treatment of this right ankle.     General Appearance  Alert, conversant and in no acute stress.  Vascular  Dorsalis pedis and posterior pulses are palpable  bilaterally.  Capillary return is within normal limits  bilaterally. Temperature is within normal limits  Bilaterally.  Neurologic  Senn-Weinstein monofilament wire test diminished  bilaterally. Muscle power within normal limits bilaterally.  Nails Thick disfigured discolored nails with subungual debris bilaterally from hallux to fifth toes bilaterally. No evidence of bacterial infection or drainage bilaterally.  Orthopedic  No limitations of motion of motion feet bilaterally.  No crepitus or effusions noted.  No bony pathology or digital deformities noted. DJD first MPJ left foot.  Palpable pain at the anterior aspect of the right ankle.  No swelling is noted.    Skin  normotropic skin with no porokeratosis noted bilaterally.  No signs of infections or ulcers noted.     Arthritis right ankle.     ROV  Injection therapy.  Injection therapy using 1.0 cc. Of 2% xylocaine( 20 mg.) plus 1 cc. of kenalog-la ( 10 mg) plus 1/2 cc. of dexamethazone phosphate ( 2 mg).  The injection was given. Half in the sinus tarsi and a half on the anterior aspect of the right ankle.   He was told to come to the office and make an  appointment for his ankle pain. If pain persists.  He may be a candidate for an [place] brace.  Patient should return to the office in 10-12 weeks for for preventative foot care services.   Gardiner Barefoot DPM

## 2017-04-06 ENCOUNTER — Other Ambulatory Visit: Payer: Medicare Other | Admitting: Orthotics

## 2017-04-08 ENCOUNTER — Other Ambulatory Visit: Payer: Medicare Other

## 2017-04-22 ENCOUNTER — Telehealth: Payer: Self-pay | Admitting: Podiatry

## 2017-04-22 NOTE — Telephone Encounter (Signed)
Left message for pt to call to schedule an appt with [facility] for a brace..30 minute appt.

## 2017-06-11 ENCOUNTER — Other Ambulatory Visit: Payer: Self-pay | Admitting: Podiatry

## 2017-06-16 ENCOUNTER — Encounter: Payer: Self-pay | Admitting: Podiatry

## 2017-06-16 ENCOUNTER — Ambulatory Visit (INDEPENDENT_AMBULATORY_CARE_PROVIDER_SITE_OTHER): Payer: Medicare Other | Admitting: Podiatry

## 2017-06-16 DIAGNOSIS — E1140 Type 2 diabetes mellitus with diabetic neuropathy, unspecified: Secondary | ICD-10-CM

## 2017-06-16 DIAGNOSIS — I739 Peripheral vascular disease, unspecified: Secondary | ICD-10-CM

## 2017-06-16 DIAGNOSIS — M79676 Pain in unspecified toe(s): Secondary | ICD-10-CM

## 2017-06-16 DIAGNOSIS — B351 Tinea unguium: Secondary | ICD-10-CM

## 2017-06-16 DIAGNOSIS — D689 Coagulation defect, unspecified: Secondary | ICD-10-CM

## 2017-06-16 NOTE — Progress Notes (Signed)
Patient ID: Zachary Hoffman, male   DOB: 29-May-1940, 77 y.o.   MRN: [id number] Complaint:  Visit Type: Patient returns to my office for continued preventative foot care services. Complaint: Patient states" my nails have grown long and thick and become painful to walk and wear shoes" Patient has been diagnosed with DM with no foot complications. The patient presents for preventative foot care services. No changes to ROS  Podiatric Exam: Vascular: dorsalis pedis and posterior tibial pulses are palpable bilateral. Capillary return is immediate. Temperature gradient is WNL. Skin turgor WNL  Sensorium: Diminished  Semmes Weinstein monofilament test. Normal tactile sensation bilaterally. Nail Exam: Pt has thick disfigured discolored nails with subungual debris noted bilateral entire nail hallux through fifth toenails Ulcer Exam: There is no evidence of ulcer or pre-ulcerative changes or infection. Orthopedic Exam: Muscle tone and strength are WNL. No limitations in general ROM. No crepitus or effusions noted. 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 Bony prominences are unremarkable. Skin: No Porokeratosis. No infection or ulcers  Diagnosis:  Onychomycosis, , Pain in right toe, pain in left toes  Treatment & Plan Procedures and Treatment: Consent by patient was obtained for treatment procedures. The patient understood the discussion of treatment and procedures well. All questions were answered thoroughly reviewed. Debridement of mycotic and hypertrophic toenails, 1 through 5 bilateral and clearing of subungual debris. No ulceration, no infection noted.  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   Return Visit-Office Procedure: Patient instructed to return to the office for a follow up visit 3 months for continued evaluation and treatment.    Gardiner Barefoot DPM

## 2017-09-15 ENCOUNTER — Ambulatory Visit: Payer: Medicare Other | Admitting: Podiatry

## 2017-11-20 ENCOUNTER — Ambulatory Visit (INDEPENDENT_AMBULATORY_CARE_PROVIDER_SITE_OTHER): Payer: Medicare Other | Admitting: Podiatry

## 2017-11-20 ENCOUNTER — Encounter: Payer: Self-pay | Admitting: Podiatry

## 2017-11-20 DIAGNOSIS — M1A0720 Idiopathic chronic gout, left ankle and foot, without tophus (tophi): Secondary | ICD-10-CM | POA: Diagnosis not present

## 2017-11-20 DIAGNOSIS — E1142 Type 2 diabetes mellitus with diabetic polyneuropathy: Secondary | ICD-10-CM | POA: Diagnosis not present

## 2017-11-20 DIAGNOSIS — M79674 Pain in right toe(s): Secondary | ICD-10-CM | POA: Diagnosis not present

## 2017-11-20 DIAGNOSIS — B351 Tinea unguium: Secondary | ICD-10-CM

## 2017-11-20 DIAGNOSIS — L84 Corns and callosities: Secondary | ICD-10-CM | POA: Diagnosis not present

## 2017-11-20 DIAGNOSIS — E1151 Type 2 diabetes mellitus with diabetic peripheral angiopathy without gangrene: Secondary | ICD-10-CM

## 2017-11-20 DIAGNOSIS — M79675 Pain in left toe(s): Secondary | ICD-10-CM | POA: Diagnosis not present

## 2017-11-20 NOTE — Patient Instructions (Signed)

## 2017-11-20 NOTE — Progress Notes (Signed)
Subjective: Albina Billet presents today for follow up with  cc of painful, discolored, thick toenails which interfere with daily activities and routine tasks.  Pain is aggravated when wearing enclosed shoe gear. Pain is getting progressively worse and relieved with periodic professional debridement.  Mr. Lanum relates he is recovering from a gout attack and his left foot is somewhat still tender. He states he does not know what foods to avoid to prevent future gout attacks.  Mr. Dallman does have h/o PAD, having a fem-fem bypass graft LLE in [place] in 2009.  Objective:  Vascular Examination: Capillary refill time immediate x 10 digits Dorsalis pedis and posterior tibial pulses present b/l No digital hair x 10 digits Skin temperature warm to warm b/l  Dermatological Examination: Skin with normal turgor, texture and tone Toenails 1-5 b/l discolored, thick, dystrophic with subungual debris and pain with palpation to nailbeds due to thickness of nails. Hyperkeratotic lesions submet head 5 b/l  Musculoskeletal: Muscle strength 5/5 to all LE muscle groups  Neurological: Sensation intact with 10 gram monofilament. Vibratory sensation intact.  Assessment: Painful onychomycosis toenails 1-5 b/l  Callus submetatarsal head 5 b/l NIDDM with PAD Gout resolving  Plan: 1. Discussed gout and foods to avoid. Written information dispensed on today. 2. Toenails 1-5 b/l were debrided in length and girth without iatrogenic bleeding. 3. Calluses pared with chisel blade b/l submet head 5 4. Patient to continue soft, supportive shoe gear 5. Patient to report any pedal injuries to medical professional immediately. 6. Follow up 3 months.  7. Patient to call should there be a concern in the interim.

## 2017-11-26 ENCOUNTER — Ambulatory Visit (INDEPENDENT_AMBULATORY_CARE_PROVIDER_SITE_OTHER): Payer: Medicare Other | Admitting: Cardiovascular Disease

## 2017-11-26 ENCOUNTER — Encounter: Payer: Self-pay | Admitting: Cardiovascular Disease

## 2017-11-26 VITALS — BP 176/64 | HR 55 | Ht 67.0 in | Wt 177.6 lb

## 2017-11-26 DIAGNOSIS — N183 Chronic kidney disease, stage 3 unspecified: Secondary | ICD-10-CM

## 2017-11-26 DIAGNOSIS — Z720 Tobacco use: Secondary | ICD-10-CM

## 2017-11-26 DIAGNOSIS — E785 Hyperlipidemia, unspecified: Secondary | ICD-10-CM | POA: Diagnosis not present

## 2017-11-26 DIAGNOSIS — I739 Peripheral vascular disease, unspecified: Secondary | ICD-10-CM

## 2017-11-26 DIAGNOSIS — I10 Essential (primary) hypertension: Secondary | ICD-10-CM | POA: Diagnosis not present

## 2017-11-26 DIAGNOSIS — E1122 Type 2 diabetes mellitus with diabetic chronic kidney disease: Secondary | ICD-10-CM

## 2017-11-26 DIAGNOSIS — E039 Hypothyroidism, unspecified: Secondary | ICD-10-CM

## 2017-11-26 DIAGNOSIS — I2510 Atherosclerotic heart disease of native coronary artery without angina pectoris: Secondary | ICD-10-CM | POA: Diagnosis not present

## 2017-11-26 DIAGNOSIS — Z79899 Other long term (current) drug therapy: Secondary | ICD-10-CM

## 2017-11-26 DIAGNOSIS — Z794 Long term (current) use of insulin: Secondary | ICD-10-CM

## 2017-11-26 LAB — COMPREHENSIVE METABOLIC PANEL
ALBUMIN: 4 g/dL (ref 3.5–4.8)
ALT: 24 IU/L (ref 0–44)
AST: 27 IU/L (ref 0–40)
Albumin/Globulin Ratio: 1.3 (ref 1.2–2.2)
Alkaline Phosphatase: 92 IU/L (ref 39–117)
BUN / CREAT RATIO: 14 (ref 10–24)
BUN: 32 mg/dL — AB (ref 8–27)
Bilirubin Total: 0.4 mg/dL (ref 0.0–1.2)
CALCIUM: 9.9 mg/dL (ref 8.6–10.2)
CHLORIDE: 104 mmol/L (ref 96–106)
CO2: 23 mmol/L (ref 20–29)
CREATININE: 2.32 mg/dL — AB (ref 0.76–1.27)
GFR calc Af Amer: 30 mL/min/{1.73_m2} — ABNORMAL LOW (ref >59)
GFR, EST NON AFRICAN AMERICAN: 26 mL/min/{1.73_m2} — AB (ref >59)
GLUCOSE: 44 mg/dL — AB (ref 65–99)
Globulin, Total: 3.2 g/dL (ref 1.5–4.5)
Potassium: 5.1 mmol/L (ref 3.5–5.2)
Sodium: 143 mmol/L (ref 134–144)
TOTAL PROTEIN: 7.2 g/dL (ref 6.0–8.5)

## 2017-11-26 LAB — LIPID PANEL
Chol/HDL Ratio: 4.1 ratio (ref 0.0–5.0)
Cholesterol, Total: 106 mg/dL (ref 100–199)
HDL: 26 mg/dL — AB (ref >39)
LDL CALC: 65 mg/dL (ref 0–99)
Triglycerides: 75 mg/dL (ref 0–149)
VLDL CHOLESTEROL CAL: 15 mg/dL (ref 5–40)

## 2017-11-26 LAB — TSH: TSH: 0.012 u[IU]/mL — AB (ref 0.450–4.500)

## 2017-11-26 NOTE — Progress Notes (Signed)
Cardiology Office Note:    Date:  11/27/2017   ID:  LINKOLN ALKIRE, DOB 14-Feb-1941, MRN [id number]  PCP:  [name], MD  Cardiologist:  Sanda Klein, MD    Referring MD: [name], MD   Chief complaint: coronary and peripheral artery disease f/u  History of Present Illness:    MOUHAMAD TEED is a 77 y.o. male with a hx of CAD , PAD , ongoing tobacco abuse, obesity and obstructive sleep apnea , chronic kidney disease stage III related to diabetes mellitus on insulin therapy, gout, erectile dysfunction.  There are no major medical problems since his last appointment and no hospitalizations.  He has had a few gout attacks including recent one for which she was given several weeks.  This led to increase in his blood pressure.  His medications have changed completely since I last saw him.  According to his list from [facility] he is taking the following medications:  Amlodipine 2.5 mg once daily  (the pharmacy also had a prescription for 10 mg dose, but really thinks he is taking the newer lower dose prescription) Cetirizine 10 mg daily Clopidogrel 75 mg daily Fluticasone nasal spray 2 spray daily Furosemide 40 mg as needed for swelling (he takes it daily) Lantus 50 units every evening NovoLog 2-10 units per sliding scale 3 times daily Metoprolol succinate 100 mg daily Pantoprazole 40 mg daily Simvastatin 20 mg daily  He is no longer receiving doxazosin, levothyroxine or any RAAS inhibitors.  These medications are very different from what he had listed.  He believes his antihypertensives were changed because of worsening kidney function by Dr. Jeanie Cooks and Dr. Florene Glen.  He has an appointment with Dr. Gevena Barre next month.  He has NYHA class II exertional dyspnea but denies angina pectoris.  He also denies claudication.  He has severe erectile dysfunction but does not respond to sildenafil.  He has not had syncope or palpitations.  Reports that his blood pressure has  been consistently 140-150 mm range ~he started taking steroids recently, which increased to the 170.  He has not had leg edema and denies syncope or falls.  He has not had palpitations.  He has had occasional episodes of hypoglycemia and is requiring lower and lower doses of insulin.  Unfortunately Burnie is still smoking. A pack of cigarettes lasts for 2 days.  He is still unwilling to commit to a cessation plan.  He has an extensive history of peripheral arterial disease. In 2009 he had a left common femoral to superficial femoral artery bypass procedure for occlusive disease. He also has a history of coronary artery disease and has previously received a stent to the LAD (2004, [place], no details known). His most recent lower extremity arterial Doppler study was performed in October 2018. The femoral to femoral bypass graft was widely patent with normal velocities, three-vessel runoff. ABI normal bilaterally. On the right side the anterior tibial artery is occluded. Normal LV function by nuclear stress in 2012: EF 64%, normal myocardial perfusion.  Past Medical History:  Diagnosis Date  . Asthma   . CHF (congestive heart failure) ([place])    Echo 07/2006 with normal LV fxn, and diastolic dysfxn  . Coronary artery disease   . Diabetes mellitus    On insulin   . Diverticulosis 05/2009   Noted on screening colo   . Dyslipidemia   . Eczema   . Headache(784.0)   . Hypertension   . Kidney disease, chronic,  stage II (GFR 60-89 ml/min)   . MI (myocardial infarction) ([place])    S/p stenting per patient. Last stress 04/2007 negative   . Neuropathy   . Pancreatitis    P/H  . Peripheral arterial disease (HCC)    S/p left femoro-femoral bypass. LE cath by Dr. Einar Gip 09/2007 without significant stenosis  . Tobacco abuse     Past Surgical History:  Procedure Laterality Date  . ADENOIDECTOMY    . CORONARY ANGIOPLASTY WITH STENT PLACEMENT  2004   Mid LAD, 95% first obtuse marginal   . DOPPLER  ECHOCARDIOGRAPHY  07/28/2006   Mod. LVH,EF =>55%,LA mildly dilated,mitral annular ca+,AOV mildly sclerotic,trace AI  . FEMORAL-FEMORAL BYPASS GRAFT     In [place], on the left. Patent on cath 09/2007  . LOWER EXTREMITY ANGIOGRAM  09/20/2007   no significant stenosis  . Nuclear Stress Test  09/17/2010   No ischemia  . REPLACEMENT TOTAL HIP W/  RESURFACING IMPLANTS     Left  . TONSILLECTOMY      Current Medications: Current Meds  Medication Sig  . albuterol (PROVENTIL HFA;VENTOLIN HFA) 108 (90 BASE) MCG/ACT inhaler Inhale 2 puffs into the lungs every 6 (six) hours as needed for wheezing or shortness of breath. Reported on 06/29/2015  . aspirin 325 MG tablet Take 325 mg by mouth daily.  . cetirizine (ZYRTEC) 10 MG tablet TAKE 1 TABLET EVERY DAY AS NEEDED  . clobetasol cream (TEMOVATE) 0.05 % APPLY TO AFFECTED AREA DAILY AS NEEDED  . clopidogrel (PLAVIX) 75 MG tablet Take 75 mg by mouth daily.  . cyclobenzaprine (FLEXERIL) 10 MG tablet Take 10 mg by mouth 3 (three) times daily as needed for muscle spasms.  Marland Kitchen dexlansoprazole (DEXILANT) 60 MG capsule Take 60 mg by mouth daily. Reported on 06/29/2015  . doxazosin (CARDURA) 1 MG tablet Take 2 mg by mouth at bedtime.  . fluticasone (FLONASE) 50 MCG/ACT nasal spray Place 2 sprays into both nostrils daily.  Marland Kitchen ibuprofen (ADVIL,MOTRIN) 200 MG tablet Take 400 mg by mouth every 6 (six) hours as needed for moderate pain.  Marland Kitchen insulin glulisine (APIDRA) 100 UNIT/ML injection Inject 10 Units into the skin daily. Reported on 06/29/2015  . Iron-Vitamins (GERITOL PO) Take 1 capsule by mouth every morning.  Marland Kitchen LANTUS SOLOSTAR 100 UNIT/ML Solostar Pen Inject 50 Units into the skin daily.  Marland Kitchen levothyroxine (SYNTHROID, LEVOTHROID) 100 MCG tablet Take 100 mcg by mouth daily.  . meloxicam (MOBIC) 15 MG tablet Take 1 tablet (15 mg total) by mouth daily.  . methocarbamol (ROBAXIN) 500 MG tablet Take 500 mg by mouth daily as needed. Reported on 06/29/2015  . niacin (NIASPAN)  500 MG CR tablet Take 500 mg by mouth every morning. Reported on 06/29/2015  . OMEGA-3 KRILL OIL PO Take 1 capsule by mouth every morning. Reported on 06/29/2015  . oxyCODONE-acetaminophen (PERCOCET/ROXICET) 5-325 MG per tablet Take 1-2 tablets by mouth every 6 (six) hours as needed for severe pain.  . pantoprazole (PROTONIX) 40 MG tablet Take 40 mg by mouth daily as needed. Reported on 06/29/2015  . pravastatin (PRAVACHOL) 40 MG tablet Take 40 mg by mouth at bedtime.  . pregabalin (LYRICA) 150 MG capsule Take 150 mg by mouth 3 (three) times daily.  . simvastatin (ZOCOR) 20 MG tablet Take 20 mg by mouth every evening.     Allergies:   Patient has no known allergies.   Social History   Socioeconomic History  . Marital status: Married    Spouse name: Not  on file  . Number of children: Not on file  . Years of education: Not on file  . Highest education level: Not on file  Occupational History  . Not on file  Social Needs  . Financial resource strain: Not on file  . Food insecurity:    Worry: Not on file    Inability: Not on file  . Transportation needs:    Medical: Not on file    Non-medical: Not on file  Tobacco Use  . Smoking status: Current Some Day Smoker    Packs/day: 0.50    Years: 50.00    Pack years: 25.00    Types: Cigarettes  . Smokeless tobacco: Never Used  Substance and Sexual Activity  . Alcohol use: No  . Drug use: No  . Sexual activity: Not Currently  Lifestyle  . Physical activity:    Days per week: Not on file    Minutes per session: Not on file  . Stress: Not on file  Relationships  . Social connections:    Talks on phone: Not on file    Gets together: Not on file    Attends religious service: Not on file    Active member of club or organization: Not on file    Attends meetings of clubs or organizations: Not on file    Relationship status: Not on file  Other Topics Concern  . Not on file  Social History Narrative   Lives with wife, has grown  children. Still active without cane or walker.      Family History: The patient's family history includes Asthma in his son; Cancer in his father; Diabetes in his father; Eczema in his daughter; Multiple sclerosis in his daughter; Stroke in his mother. There is no history of Allergic rhinitis, Angioedema, Immunodeficiency, or Urticaria. ROS:   Please see the history of present illness.    All other systems are reviewed and are negative  EKGs/Labs/Other Studies Reviewed:     EKG:  EKG is  ordered today.  This shows sinus bradycardia, no repolarization abnormalities, QTC 430 ms  Recent Labs: 11/26/2017: ALT 24; BUN 32; Creatinine, Ser 2.32; Potassium 5.1; Sodium 143; TSH 0.012  Recent Lipid Panel    Component Value Date/Time   CHOL 106 11/26/2017 0955   TRIG 75 11/26/2017 0955   HDL 26 (L) 11/26/2017 0955   CHOLHDL 4.1 11/26/2017 0955   CHOLHDL 4.7 02/22/2015 1222   VLDL 20 02/22/2015 1222   LDLCALC 65 11/26/2017 0955    Physical Exam:    VS:  BP (!) 176/64   Pulse (!) 55   Ht 5\' 7"  (1.702 m)   Wt 177 lb 9.6 oz (80.6 kg)   BMI 27.82 kg/m     Wt Readings from Last 3 Encounters:  11/26/17 177 lb 9.6 oz (80.6 kg)  10/31/16 192 lb 6.4 oz (87.3 kg)  02/08/16 193 lb (87.5 kg)     General: Alert, oriented x3, no distress,  Head: no evidence of trauma, PERRL, EOMI, no exophtalmos or lid lag, no myxedema, no xanthelasma; normal ears, nose and oropharynx Neck: normal jugular venous pulsations and no hepatojugular reflux; brisk carotid pulses without delay and no carotid bruits Chest: clear to auscultation, no signs of consolidation by percussion or palpation, normal fremitus, symmetrical and full respiratory excursions Cardiovascular: normal position and quality of the apical impulse, regular rhythm, normal first and second heart sounds, no murmurs, rubs or gallops Abdomen: no tenderness or distention, no masses by palpation, no abnormal  pulsatility or arterial bruits, normal bowel  sounds, no hepatosplenomegaly Extremities: no clubbing, cyanosis or edema; 2+ radial, ulnar and brachial pulses bilaterally; 2+ right femoral, posterior tibial and dorsalis pedis pulses; 2+ left femoral, posterior tibial and dorsalis pedis pulses; bilateral femoral bruits are present Neurological: grossly nonfocal Psych: Normal mood and affect   ASSESSMENT:    1. Coronary artery disease involving native coronary artery of native heart without angina pectoris   2. PVD (peripheral vascular disease)- Hx F-F in [place], last dopplers OK 6/13   3. Hyperlipidemia with target LDL less than 70   4. Essential hypertension   5. Tobacco abuse   6. Type 2 diabetes mellitus with stage 3 chronic kidney disease, with long-term current use of insulin ([place])   7. Medication management   8. Hypothyroidism, unspecified type    PLAN:    In order of problems listed above:  1. CAD: s/p remote LAD stent. He does not have angina pectoris. Normal nuclear study in 2012. Normal LVEF. 2. PAD: History of left proximal to distal superficial femoral artery bypass, patent by last Doppler study October 2018. Repeat next month.  He does not have intermittent claudication, but does have severe erectile dysfunction. 3. Hyperlipidemia.  LDL well within target range, HDL is stubbornly low.  Continue current statin prescription.  Would recommend increased physical activity to help the HDL. 4. HTN: His blood pressure is unacceptably high, target should be 130/80.  Asked him to go home and check all his medicine bottles to make sure we are on the same page regarding his medications before we make any changes.   If he is indeed on the lowest dose of amlodipine, would like to increase. 5. Ongoing tobacco abuse: Smoking cessation is the most important to provide future coronary events, improved PAD again may be improved problems with erectile dysfunction 6. Insulin-requiring diabetes mellitus: Reduce insulin dosage requirements may be  related to significant weight loss, but also with to worsening renal function 7. Chronic kidney disease stage III-IV, seeing Dr. Florene Glen.  GFR now seems to be around 30.  We discussed the fact that his discussions regarding hemodialysis with Dr. Florene Glen may become more relevant and that he should plan for establishing permanent dialysis access a good 6 months before anticipated need. Defer diuretic dose to Dr. Florene Glen. 8. Suppressed TSH: Used to take supplements for hypothyroidism.  This finding is quite surprising if he is not taking thyroid supplements anymore.  May need further work-up with PCP or endocrinologist.   Medication Adjustments/Labs and Tests Ordered: Current medicines are reviewed at length with the patient today.  Concerns regarding medicines are outlined above.  Orders Placed This Encounter  Procedures  . Comprehensive metabolic panel  . Lipid panel  . TSH  . EKG 12-Lead   No orders of the defined types were placed in this encounter.   Signed, Sanda Klein, MD  11/27/2017 6:26 PM    [place]

## 2017-11-26 NOTE — Patient Instructions (Signed)
Medication Instructions: Please review your prescriptions when you get home. Call back, [phone], with any discrepancies. Please let us know if you are taking Levothyroxine and what dose of Amlodipine you are currently taking.  Labwork: Your physician recommends that you return for lab work at your earliest [place].  Testing/Procedures: 1. Lower Extremity Arterial Dopplers - Your physician has requested that you have a lower extremity arterial duplex. This test is an ultrasound of the arteries in the legs. It looks at arterial blood flow in the legs. Allow one hour for Lower Arterial scans. There are no restrictions or special instructions.  2. ABIs - Your physician has requested that you have an ankle brachial index (ABI). During this test an ultrasound and blood pressure cuff are used to evaluate the arteries that supply the arms and legs with blood. Allow thirty minutes for this exam. There are no restrictions or special instructions.  Follow-up: Dr Sallyanne Kuster recommends that you schedule a follow-up appointment in 12 months. You will receive a reminder letter in the mail two months in advance. If you don't receive a letter, please call our office to schedule the follow-up appointment.  If you need a refill on your cardiac medications before your next appointment, please call your pharmacy.

## 2017-12-03 ENCOUNTER — Telehealth: Payer: Self-pay

## 2017-12-03 ENCOUNTER — Telehealth: Payer: Self-pay | Admitting: Cardiovascular Disease

## 2017-12-03 NOTE — Telephone Encounter (Signed)
I called patient, he advised he already had the lab results, just wanted them faxed to him. I faxed them to patient to given number.

## 2017-12-03 NOTE — Telephone Encounter (Signed)
Reviewed medications with patient yesterday evening, one by one.   Patient is not taking Levothyroxine.  He is taking Metoprolol Succinate 100 mg QD, Amlodipine 2.5 mg QD, and Furosemide 40 mg QD which weren't on his medication list to start.  Medication list updated per patient report.

## 2017-12-03 NOTE — Telephone Encounter (Signed)
New Message:   Patient calling about some results. He stated that he would like the results be  fax to [phone].

## 2017-12-04 NOTE — Telephone Encounter (Signed)
Called the pt after I refaxed the labs and he said that he still did not receive it... Even though his wife is sitting next to it and waiting... I explained how My Chart works and they were willing to sign up for it.. They were given an activation code and started to register while on the phone with me.. They were very appreciative and will let us know if they have any problems.

## 2017-12-04 NOTE — Telephone Encounter (Signed)
F/U Message       Per patient he has not recv'd the faxed lab report, patient ask again to refax lab results to fax [phone]

## 2017-12-11 ENCOUNTER — Other Ambulatory Visit: Payer: Self-pay | Admitting: Cardiovascular Disease

## 2017-12-11 DIAGNOSIS — Z95820 Peripheral vascular angioplasty status with implants and grafts: Secondary | ICD-10-CM

## 2017-12-11 DIAGNOSIS — I739 Peripheral vascular disease, unspecified: Secondary | ICD-10-CM

## 2017-12-15 ENCOUNTER — Ambulatory Visit (HOSPITAL_COMMUNITY)
Admission: RE | Admit: 2017-12-15 | Discharge: 2017-12-15 | Disposition: A | Discharge status: Home / Self Care | Payer: Medicare Other | Source: Ambulatory Visit | Attending: Cardiovascular Disease | Admitting: Cardiovascular Disease

## 2017-12-15 DIAGNOSIS — I739 Peripheral vascular disease, unspecified: Secondary | ICD-10-CM | POA: Insufficient documentation

## 2017-12-15 DIAGNOSIS — Z95820 Peripheral vascular angioplasty status with implants and grafts: Secondary | ICD-10-CM | POA: Insufficient documentation

## 2017-12-22 ENCOUNTER — Ambulatory Visit (INDEPENDENT_AMBULATORY_CARE_PROVIDER_SITE_OTHER): Payer: Medicare Other

## 2017-12-22 ENCOUNTER — Other Ambulatory Visit: Payer: Self-pay | Admitting: Sports Medicine

## 2017-12-22 ENCOUNTER — Ambulatory Visit (INDEPENDENT_AMBULATORY_CARE_PROVIDER_SITE_OTHER): Payer: Medicare Other | Admitting: Sports Medicine

## 2017-12-22 ENCOUNTER — Encounter: Payer: Self-pay | Admitting: Sports Medicine

## 2017-12-22 DIAGNOSIS — M109 Gout, unspecified: Secondary | ICD-10-CM

## 2017-12-22 DIAGNOSIS — M779 Enthesopathy, unspecified: Principal | ICD-10-CM

## 2017-12-22 DIAGNOSIS — R609 Edema, unspecified: Secondary | ICD-10-CM

## 2017-12-22 DIAGNOSIS — M778 Other enthesopathies, not elsewhere classified: Secondary | ICD-10-CM

## 2017-12-22 DIAGNOSIS — E1140 Type 2 diabetes mellitus with diabetic neuropathy, unspecified: Secondary | ICD-10-CM | POA: Diagnosis not present

## 2017-12-22 MED ORDER — TRIAMCINOLONE ACETONIDE 10 MG/ML IJ SUSP: 10.0000 mg | Freq: Once | INTRAMUSCULAR | Status: DC

## 2017-12-22 NOTE — Progress Notes (Signed)
Subjective: Zachary Hoffman is a 77 y.o. male patient who presents to office for evaluation of Left foot pain. Patient complains of progressive pain over the left first metatarsophalangeal joint states that it is very tender over the joint and feels like needles sensations and has pain that radiates up the leg.  Patient admits to a history of gout and states that he does not recall eating any foods that could have triggered it however has had a ongoing history of pain and reports that he always likes to keep a good eye on his left lower extremity because he has a history of a skin pop bypass on the left lower extremity that was performed in [place] back in 09.  Patient denies constitutional symptoms at this time.   Patient Active Problem List   Diagnosis Date Noted  . Tobacco abuse 06/06/2014  . OSA on CPAP 02/22/2013  . DM2 (diabetes mellitus, type 2) ([place]) 02/22/2013  . Hyperlipidemia with target LDL less than 70 02/22/2013  . PVD (peripheral vascular disease)- Hx F-F in [place], last dopplers [facility] 6/13 10/25/2012  . CAD (coronary artery disease)- Hx of LAD stent in '04- low risk Myoview 6/12 10/25/2012  . Edema- Lt leg, new since May 10/25/2012  . Unspecified sleep apnea by history 10/25/2012  . Obesity 10/25/2012  . Situational stress- going through his 3d seperation (same wife) 10/25/2012  . Renal insufficiency 06/10/2011  . Hypertension     Current Outpatient Medications on File Prior to Visit  Medication Sig Dispense Refill  . amLODipine (NORVASC) 2.5 MG tablet Take 2.5 mg by mouth daily.    Marland Kitchen aspirin EC 81 MG tablet Take 81 mg by mouth daily.    . Calcium Carbonate (CALCIUM 600 PO) Take 600 mg by mouth daily.    . cetirizine (ZYRTEC) 10 MG tablet TAKE 1 TABLET EVERY DAY AS NEEDED  5  . clopidogrel (PLAVIX) 75 MG tablet Take 75 mg by mouth daily.    . fluticasone (FLONASE) 50 MCG/ACT nasal spray Place 2 sprays into both nostrils daily as needed for allergies.   0  . furosemide (LASIX) 40  MG tablet Take 40 mg by mouth.    . insulin glulisine (APIDRA) 100 UNIT/ML injection Inject 10 Units into the skin 3 (three) times daily with meals. Reported on 06/29/2015    . Iron-Vitamins (GERITOL PO) Take 1 capsule by mouth every morning.    Marland Kitchen LANTUS SOLOSTAR 100 UNIT/ML Solostar Pen Inject 45 Units into the skin daily.   5  . levothyroxine (SYNTHROID, LEVOTHROID) 100 MCG tablet Take 100 mcg by mouth daily.  3  . metoprolol succinate (TOPROL-XL) 100 MG 24 hr tablet Take 100 mg by mouth daily. Take with or immediately following a meal.    . MITIGARE 0.6 MG CAPS Take 0.6 mg by mouth daily as needed (gout attacks).    . pantoprazole (PROTONIX) 40 MG tablet Take 40 mg by mouth daily. Reported on 06/29/2015    . simvastatin (ZOCOR) 20 MG tablet Take 20 mg by mouth every evening.     No current facility-administered medications on file prior to visit.     No Known Allergies  Objective:  General: Alert and oriented x3 in no acute distress  Dermatology: Focal Swelling, warmth, redness present on the left first metatarsophalangeal joint, No open lesions bilateral lower extremities, no webspace macerations, no ecchymosis bilateral, all nails x 10 are well manicured, more debrided last month.  Vascular: Dorsalis Pedis and Posterior Tibial pedal  pulses 1/4, Capillary Fill Time 3 seconds,(-) pedal hair growth bilateral,Temperature gradient increased over the left first metatarsophalangeal joint.  Neurology: Subjective burning sensations bilateral.  Musculoskeletal: There is tenderness with palpation at left first metatarsophalangeal joint with guarding with range of motion, Strength within normal limits in all groups bilateral.    Xrays  Left foot   Impression: Mild soft tissue swelling with erosions at first metatarsal head consistent with gout no other acute findings.       Assessment and Plan: Problem List Items Addressed This Visit    None    Visit Diagnoses    Gouty arthritis of left  great toe    -  Primary   Swelling       Relevant Orders   DG Foot Complete Left   Type 2 diabetes, controlled, with neuropathy (HCC)          -Complete examination performed -Xrays reviewed -Discussed treatement options for gouty arthritis and gout education provided - After oral consent, injected left first metatarsophalangeal joint with 1cc lidocaine and marcaine plain mixed with 0.25cc Kenalog-10 and Dexmethasone phosphate without complication; post injection care explained. -Continue with allopurinol as given by his primary care doctor -Patient to return in 3 weeks for re-check/further discussion for long term management of gout or sooner if condition worsens.  Landis Martins, DPM

## 2017-12-31 ENCOUNTER — Telehealth: Payer: Self-pay

## 2017-12-31 DIAGNOSIS — I739 Peripheral vascular disease, unspecified: Secondary | ICD-10-CM

## 2017-12-31 NOTE — Telephone Encounter (Signed)
-----   Message from Sanda Klein, MD sent at 12/16/2017  8:45 AM EDT ----- Femoral bypass is open, good flow to both feet. Recheck in 12 months.

## 2018-01-12 ENCOUNTER — Ambulatory Visit (INDEPENDENT_AMBULATORY_CARE_PROVIDER_SITE_OTHER): Payer: Medicare Other | Admitting: Sports Medicine

## 2018-01-12 ENCOUNTER — Encounter: Payer: Self-pay | Admitting: Sports Medicine

## 2018-01-12 DIAGNOSIS — I2510 Atherosclerotic heart disease of native coronary artery without angina pectoris: Secondary | ICD-10-CM

## 2018-01-12 DIAGNOSIS — E1140 Type 2 diabetes mellitus with diabetic neuropathy, unspecified: Secondary | ICD-10-CM

## 2018-01-12 DIAGNOSIS — R609 Edema, unspecified: Secondary | ICD-10-CM

## 2018-01-12 DIAGNOSIS — M109 Gout, unspecified: Secondary | ICD-10-CM

## 2018-01-12 NOTE — Progress Notes (Signed)
Subjective: Zachary Hoffman is a 77 y.o. male patient who presents to office for follow-up evaluation of left great toe joint gout.  Patient reports that after injection last visit he is being toe joint is doing much better he is able to now walk without any pain or any acute recurrence of swelling redness warmth or tenderness to his left big toe joint.  Patient denies any other pedal complaints currently at this time.  Patient Active Problem List   Diagnosis Date Noted  . Tobacco abuse 06/06/2014  . OSA on CPAP 02/22/2013  . DM2 (diabetes mellitus, type 2) ([place]) 02/22/2013  . Hyperlipidemia with target LDL less than 70 02/22/2013  . PVD (peripheral vascular disease)- Hx F-F in [place], last dopplers [facility] 6/13 10/25/2012  . CAD (coronary artery disease)- Hx of LAD stent in '04- low risk Myoview 6/12 10/25/2012  . Edema- Lt leg, new since May 10/25/2012  . Unspecified sleep apnea by history 10/25/2012  . Obesity 10/25/2012  . Situational stress- going through his 3d seperation (same wife) 10/25/2012  . Renal insufficiency 06/10/2011  . Hypertension     Current Outpatient Medications on File Prior to Visit  Medication Sig Dispense Refill  . amLODipine (NORVASC) 2.5 MG tablet Take 2.5 mg by mouth daily.    Marland Kitchen aspirin EC 81 MG tablet Take 81 mg by mouth daily.    . Calcium Carbonate (CALCIUM 600 PO) Take 600 mg by mouth daily.    . cetirizine (ZYRTEC) 10 MG tablet TAKE 1 TABLET EVERY DAY AS NEEDED  5  . clopidogrel (PLAVIX) 75 MG tablet Take 75 mg by mouth daily.    . fluticasone (FLONASE) 50 MCG/ACT nasal spray Place 2 sprays into both nostrils daily as needed for allergies.   0  . furosemide (LASIX) 40 MG tablet Take 40 mg by mouth.    . insulin glulisine (APIDRA) 100 UNIT/ML injection Inject 10 Units into the skin 3 (three) times daily with meals. Reported on 06/29/2015    . Iron-Vitamins (GERITOL PO) Take 1 capsule by mouth every morning.    Marland Kitchen LANTUS SOLOSTAR 100 UNIT/ML Solostar Pen Inject 45  Units into the skin daily.   5  . levothyroxine (SYNTHROID, LEVOTHROID) 100 MCG tablet Take 100 mcg by mouth daily.  3  . metoprolol succinate (TOPROL-XL) 100 MG 24 hr tablet Take 100 mg by mouth daily. Take with or immediately following a meal.    . MITIGARE 0.6 MG CAPS Take 0.6 mg by mouth daily as needed (gout attacks).    . pantoprazole (PROTONIX) 40 MG tablet Take 40 mg by mouth daily. Reported on 06/29/2015    . simvastatin (ZOCOR) 20 MG tablet Take 20 mg by mouth every evening.     Current Facility-Administered Medications on File Prior to Visit  Medication Dose Route Frequency Provider Last Rate Last Dose  . triamcinolone acetonide (KENALOG) 10 MG/ML injection 10 mg  10 mg Other Once Landis Martins, DPM        No Known Allergies  Objective:  General: Alert and oriented x3 in no acute distress  Dermatology: Resolved swelling, warmth, redness on the left first metatarsophalangeal joint, No open lesions bilateral lower extremities, no webspace macerations, no ecchymosis bilateral, all nails x 10 are well manicured, debrided 6 weeks ago.  Vascular: Dorsalis Pedis and Posterior Tibial pedal pulses 1/4, Capillary Fill Time 3 seconds,(-) pedal hair growth bilateral,Temperature gradient increased over the left first metatarsophalangeal joint.  Neurology: Subjective burning sensations bilateral.  Musculoskeletal: There  is no tenderness with palpation at left first metatarsophalangeal joint with guarding with range of motion, Strength within normal limits in all groups bilateral.        Assessment and Plan: Problem List Items Addressed This Visit    None    Visit Diagnoses    Gouty arthritis of left great toe    -  Primary   Swelling       Type 2 diabetes, controlled, with neuropathy (HCC)          -Complete examination performed -Discussed continued care for now resolved gout left first metatarsophalangeal joint  -Advised patient to talk with his primary care doctor on next month  about long-term management of gout and resuming his allopurinol -Patient to return as scheduled for routine foot care or sooner if any problems or issues arise.  Landis Martins, DPM

## 2018-02-19 ENCOUNTER — Encounter: Payer: Self-pay | Admitting: Podiatry

## 2018-02-19 ENCOUNTER — Ambulatory Visit (INDEPENDENT_AMBULATORY_CARE_PROVIDER_SITE_OTHER): Payer: Medicare Other | Admitting: Podiatry

## 2018-02-19 DIAGNOSIS — B351 Tinea unguium: Secondary | ICD-10-CM

## 2018-02-19 DIAGNOSIS — M79675 Pain in left toe(s): Secondary | ICD-10-CM | POA: Diagnosis not present

## 2018-02-19 DIAGNOSIS — E1151 Type 2 diabetes mellitus with diabetic peripheral angiopathy without gangrene: Secondary | ICD-10-CM

## 2018-02-19 DIAGNOSIS — L84 Corns and callosities: Secondary | ICD-10-CM

## 2018-02-19 DIAGNOSIS — M79674 Pain in right toe(s): Secondary | ICD-10-CM

## 2018-03-08 NOTE — Progress Notes (Signed)
Subjective: Zachary Hoffman is a 77 y.o. y.o. male who presents for preventative foot care today with diabetes, and cc of painful, discolored, thick toenails which interfere with daily activities. Pain is aggravated when wearing enclosed shoe gear. Pain is relieved with periodic professional debridement.  Zachary Hoffman does have h/o PAD, having a fem-fem bypass graft LLE in [place] in 2009.  States his blood sugar was 60 mg/dl this morning, but says he is feeling fine.  Zachary Hoffman also states he is to be measured for his diabetic shoes on today as well.  Objective:  Vascular Examination: Capillary refill time <3 seconds x 10 digits Dorsalis pedis pulses and Posterior tibial pulses 1/4 b/l No digital hair x 10 digits Skin temperature gradient WNL b/l  Dermatological Examination: Skin with normal turgor, texture and tone bl  Toenails 1-5 b/l discolored, thick, dystrophic with subungual debris and pain with palpation to nailbeds due to thickness of nails.  Hyperkeratotic lesion noted submetatarsal head 5 b/l with no edema, no erythema, no flocculence, no impending wound formation  Musculoskeletal: Muscle strength 5/5 to all LE muscle groups  Neurological: Sensation diminished with 10 gram monofilament. Vibratory sensation diminished  Assessment: 1. Painful onychomycosis toenails 1-5 b/l 2. Calluses submet head 5 b/l 3. NIDDM with Peripheral arterial disease  Plan: 1. Continue diabetic foot care principles. 2. Toenails 1-5 b/l were debrided in length and girth without iatrogenic bleeding. 3. Hyperkeratotic lesion(s)  pared b/l submet head 5 with sterile chisel blade and gently smoothed with burr. 4. Patient was measured for his diabetic shoes by Velora Heckler on today. 5. Patient to continue soft, supportive shoe gear 6. Patient to report any pedal injuries to medical professional  7. Follow up 3 months. Patient/POA to call should there be a concern in the interim.

## 2018-04-27 ENCOUNTER — Telehealth: Payer: Self-pay

## 2018-04-27 NOTE — Telephone Encounter (Signed)
Ok

## 2018-04-27 NOTE — Telephone Encounter (Signed)
Spoke with patient.  Dr Jeanie Cooks discontinued amlodipine in November. Started patient on Doxazosin. Patient has not been keeping track of his blood pressure as his home monitor has not been accurate as of late. He will get a new one and start keeping track of his readings and report them back to [place].   Medical record updated.

## 2018-04-27 NOTE — Telephone Encounter (Signed)
-----   Message from Sanda Klein, MD sent at 12/04/2017  8:09 AM EDT ----- Ask him to increase the amlodipine to 10 mg once daily and let us know what his blood pressure is in a couple of weeks, please MCr.

## 2018-05-12 ENCOUNTER — Ambulatory Visit: Payer: Medicare Other | Admitting: Orthotics

## 2018-05-12 DIAGNOSIS — E1151 Type 2 diabetes mellitus with diabetic peripheral angiopathy without gangrene: Secondary | ICD-10-CM | POA: Diagnosis not present

## 2018-05-12 DIAGNOSIS — L84 Corns and callosities: Secondary | ICD-10-CM

## 2018-05-12 DIAGNOSIS — E1140 Type 2 diabetes mellitus with diabetic neuropathy, unspecified: Secondary | ICD-10-CM

## 2018-05-25 ENCOUNTER — Ambulatory Visit (INDEPENDENT_AMBULATORY_CARE_PROVIDER_SITE_OTHER): Payer: Medicare Other

## 2018-05-25 ENCOUNTER — Other Ambulatory Visit: Payer: Self-pay

## 2018-05-25 ENCOUNTER — Encounter: Payer: Self-pay | Admitting: Podiatry

## 2018-05-25 ENCOUNTER — Ambulatory Visit (INDEPENDENT_AMBULATORY_CARE_PROVIDER_SITE_OTHER): Payer: Medicare Other | Admitting: Podiatry

## 2018-05-25 DIAGNOSIS — B351 Tinea unguium: Secondary | ICD-10-CM

## 2018-05-25 DIAGNOSIS — L84 Corns and callosities: Secondary | ICD-10-CM | POA: Diagnosis not present

## 2018-05-25 DIAGNOSIS — M7750 Other enthesopathy of unspecified foot: Secondary | ICD-10-CM

## 2018-05-25 DIAGNOSIS — M1A0720 Idiopathic chronic gout, left ankle and foot, without tophus (tophi): Secondary | ICD-10-CM | POA: Diagnosis not present

## 2018-05-25 DIAGNOSIS — M79676 Pain in unspecified toe(s): Secondary | ICD-10-CM

## 2018-05-25 DIAGNOSIS — M7752 Other enthesopathy of left foot: Secondary | ICD-10-CM

## 2018-05-25 DIAGNOSIS — M19072 Primary osteoarthritis, left ankle and foot: Secondary | ICD-10-CM

## 2018-05-25 DIAGNOSIS — E1142 Type 2 diabetes mellitus with diabetic polyneuropathy: Secondary | ICD-10-CM

## 2018-05-25 NOTE — Patient Instructions (Signed)

## 2018-05-28 ENCOUNTER — Ambulatory Visit: Payer: Medicare Other | Admitting: Orthotics

## 2018-05-28 DIAGNOSIS — M79674 Pain in right toe(s): Principal | ICD-10-CM

## 2018-05-28 DIAGNOSIS — I739 Peripheral vascular disease, unspecified: Secondary | ICD-10-CM

## 2018-05-28 DIAGNOSIS — B351 Tinea unguium: Secondary | ICD-10-CM

## 2018-05-28 DIAGNOSIS — E1140 Type 2 diabetes mellitus with diabetic neuropathy, unspecified: Secondary | ICD-10-CM

## 2018-05-28 DIAGNOSIS — E1151 Type 2 diabetes mellitus with diabetic peripheral angiopathy without gangrene: Secondary | ICD-10-CM

## 2018-05-28 DIAGNOSIS — L84 Corns and callosities: Secondary | ICD-10-CM

## 2018-05-28 DIAGNOSIS — M79675 Pain in left toe(s): Principal | ICD-10-CM

## 2018-05-28 NOTE — Progress Notes (Signed)
Patient's [place] brace would not work in the DBS he received; gave him a pair of shoes we couldn't return to [place]; it worked and he was happy.

## 2018-06-02 NOTE — Progress Notes (Signed)
Subjective: Zachary Hoffman is a 78 y.o. y.o. male with history of diabetes and PAD.  He presents for preventative foot care today with chief concern painful, mycotic toenails and plantar calluses bilaterally.  Both interfere with daily activities. Pain is aggravated when wearing enclosed shoe gear. Pain is relieved with periodic professional debridement.  Today he relates no pain in his left ankle.  He denies any episodes of trauma.  He states pain present upon awakening 1 morning last week.  Patient is also received diabetic shoes but states he is unable to wear his braces in his shoes.  He is also on Plavix.  [name], MD is his PCP.  Current Outpatient Medications:  .  amLODipine (NORVASC) 2.5 MG tablet, TK 1 T PO QD, Disp: , Rfl:  .  aspirin EC 81 MG tablet, Take 81 mg by mouth daily., Disp: , Rfl:  .  Calcium Carbonate (CALCIUM 600 PO), Take 600 mg by mouth daily., Disp: , Rfl:  .  cetirizine (ZYRTEC) 10 MG tablet, TAKE 1 TABLET EVERY DAY AS NEEDED, Disp: , Rfl: 5 .  clopidogrel (PLAVIX) 75 MG tablet, Take 75 mg by mouth daily., Disp: , Rfl:  .  doxazosin (CARDURA) 4 MG tablet, TK 1 T PO QD, Disp: , Rfl: 2 .  fluticasone (FLONASE) 50 MCG/ACT nasal spray, Place 2 sprays into both nostrils daily as needed for allergies. , Disp: , Rfl: 0 .  furosemide (LASIX) 40 MG tablet, Take 40 mg by mouth., Disp: , Rfl:  .  insulin glulisine (APIDRA) 100 UNIT/ML injection, Inject 10 Units into the skin 3 (three) times daily with meals. Reported on 06/29/2015, Disp: , Rfl:  .  Iron-Vitamins (GERITOL PO), Take 1 capsule by mouth every morning., Disp: , Rfl:  .  LANTUS SOLOSTAR 100 UNIT/ML Solostar Pen, Inject 45 Units into the skin daily. , Disp: , Rfl: 5 .  levothyroxine (SYNTHROID, LEVOTHROID) 100 MCG tablet, Take 100 mcg by mouth daily., Disp: , Rfl: 3 .  metoprolol succinate (TOPROL-XL) 100 MG 24 hr tablet, Take 100 mg by mouth daily. Take with or immediately following a meal., Disp: , Rfl:  .   MITIGARE 0.6 MG CAPS, Take 0.6 mg by mouth daily as needed (gout attacks)., Disp: , Rfl:  .  mupirocin cream (BACTROBAN) 2 %, APPLY TO THE AFFECTED AREA OF SKIN TWICE DAILY, Disp: , Rfl: 2 .  NOVOLOG FLEXPEN 100 UNIT/ML FlexPen, INJECT 10 UNITS SQ TID AC, Disp: , Rfl: 5 .  pantoprazole (PROTONIX) 40 MG tablet, Take 40 mg by mouth daily. Reported on 06/29/2015, Disp: , Rfl:  .  pregabalin (LYRICA) 150 MG capsule, TK ONE C PO TID, Disp: , Rfl:  .  simvastatin (ZOCOR) 20 MG tablet, Take 20 mg by mouth every evening., Disp: , Rfl:  .  tiZANidine (ZANAFLEX) 4 MG tablet, TK 1 T PO BID PRN, Disp: , Rfl: 2  Current Facility-Administered Medications:  .  triamcinolone acetonide (KENALOG) 10 MG/ML injection 10 mg, 10 mg, Other, Once, Stover, [place], DPM  No Known Allergies  Objective: Vascular Examination: Capillary refill time <3 seconds x 10 digits  Dorsalis pedis pulses 1/4 b/l  Posterior tibial pulses 1/4 b/l  No digital hair x 10 digits  Skin temperature gradient within normal limits bilaterally  Dermatological Examination: Skin with normal turgor texture and tone bilaterally.  Toenails 1-5 b/l discolored, thick, dystrophic with subungual debris and pain with palpation to nailbeds due to thickness of nails.  Hyperkeratotic lesion noted submetatarsal  head 5 bilaterally.  There is tenderness to palpation.  There is no edema, no erythema, no drainage, no flocculence noted.   Musculoskeletal: Muscle strength 5/5 to all LE muscle groups  Neurological: Sensation diminished with 10 gram monofilament.  Vibratory sensation diminished   Left foot reveals a dorsal exostosis of the talar head near the talonavicular joint.  Assessment: 1. Painful onychomycosis toenails 1-5 b/l 2. Calluses submetatarsal head 5 bilaterally 3. DJD/pain left foot 4. NIDDM neuropathy and PAD  Plan: 1. Discuss diabetic foot care principles. Literature dispensed. 2. Toenails 1-5 b/l were debrided in length  and girth without iatrogenic bleeding. 3. X-ray left foot taken and reviewed with patient. 4. Mr. Stocking will bring his braces and diabetic shoes and to see Velora Heckler for evaluation. 5. We will order uric acid to rule out gout. 6. Hyperkeratotic lesion(s)  pared with sterile chisel blade and gently smoothed with burr. 7. Patient to continue wearing diabetic shoes daily. 8. Patient to report any pedal injuries to medical professional immediately. 9. Follow up 3 months.  We will call him with results of uric acid when we receive them. 10. Patient/POA to call should there be a concern in the interim.

## 2018-07-05 NOTE — Progress Notes (Signed)

## 2018-08-24 ENCOUNTER — Encounter: Payer: Self-pay | Admitting: Podiatry

## 2018-08-24 ENCOUNTER — Ambulatory Visit (INDEPENDENT_AMBULATORY_CARE_PROVIDER_SITE_OTHER): Payer: Medicare Other | Admitting: Podiatry

## 2018-08-24 ENCOUNTER — Other Ambulatory Visit: Payer: Self-pay

## 2018-08-24 VITALS — Temp 97.7°F

## 2018-08-24 DIAGNOSIS — B351 Tinea unguium: Secondary | ICD-10-CM

## 2018-08-24 DIAGNOSIS — M79676 Pain in unspecified toe(s): Secondary | ICD-10-CM

## 2018-08-24 DIAGNOSIS — L84 Corns and callosities: Secondary | ICD-10-CM

## 2018-08-24 DIAGNOSIS — E1140 Type 2 diabetes mellitus with diabetic neuropathy, unspecified: Secondary | ICD-10-CM | POA: Diagnosis not present

## 2018-08-24 NOTE — Patient Instructions (Signed)
Diabetic Neuropathy Diabetic neuropathy refers to nerve damage that is caused by diabetes (diabetes mellitus). Over time, people with diabetes can develop nerve damage throughout the body. There are several types of diabetic neuropathy:  Peripheral neuropathy. This is the most common type of diabetic neuropathy. It causes damage to nerves that carry signals between the spinal cord and other parts of the body (peripheral nerves). This usually affects nerves in the feet and legs first, and may eventually affect the hands and arms. The damage affects the ability to sense touch or temperature.  Autonomic neuropathy. This type causes damage to nerves that control involuntary functions (autonomic nerves). These nerves carry signals that control: ? Heartbeat. ? Body temperature. ? Blood pressure. ? Urination. ? Digestion. ? Sweating. ? Sexual function. ? Response to changing blood sugar (glucose) levels.  Focal neuropathy. This type of nerve damage affects one area of the body, such as an arm, a leg, or the face. The injury may involve one nerve or a small group of nerves. Focal neuropathy can be painful and unpredictable, and occurs most often in older adults with diabetes. This often develops suddenly, but usually improves over time and does not cause long-term problems.  Proximal neuropathy. This type of nerve damage affects the nerves of the thighs, hips, buttocks, or legs. It causes severe pain, weakness, and muscle death (atrophy), usually in the thigh muscles. It is more common among older men and people who have type 2 diabetes. The length of recovery time may vary. What are the causes? Peripheral, autonomic, and focal neuropathies are caused by diabetes that is not well controlled with treatment. The cause of proximal neuropathy is not known, but it may be caused by inflammation related to uncontrolled blood glucose levels. What are the signs or symptoms? Peripheral neuropathy Peripheral  neuropathy develops slowly over time. When the nerves of the feet and legs no longer work, you may experience:  Burning, stabbing, or aching pain in the legs or feet.  Pain or cramping in the legs or feet.  Loss of feeling (numbness) and inability to feel pressure or pain in the feet. This can lead to: ? Thick calluses or sores on areas of constant pressure. ? Ulcers. ? Reduced ability to feel temperature changes.  Foot deformities.  Muscle weakness.  Loss of balance or coordination. Autonomic neuropathy The symptoms of autonomic neuropathy vary depending on which nerves are affected. Symptoms may include:  Problems with digestion, such as: ? Nausea or vomiting. ? Poor appetite. ? Bloating. ? Diarrhea or constipation. ? Trouble swallowing. ? Losing weight without trying to.  Problems with the heart, blood and lungs, such as: ? Dizziness, especially when standing up. ? Fainting. ? Shortness of breath. ? Irregular heartbeat.  Bladder problems, such as: ? Trouble starting or stopping urination. ? Leaking urine. ? Trouble emptying the bladder. ? Urinary tract infections (UTIs).  Problems with other body functions, such as: ? Sweat. You may sweat too much or too little. ? Temperature. You might get hot easily. Or, you might feel cold more than usual. ? Sexual function. Men may not be able to get or maintain an erection. Women may have vaginal dryness and difficulty with arousal. Focal neuropathy Symptoms affect only one area of the body. Common symptoms include:  Numbness.  Tingling.  Burning pain.  Prickling feeling.  Very sensitive skin.  Weakness.  Inability to move (paralysis).  Muscle twitching.  Muscles getting smaller (wasting).  Poor coordination.  Double or blurred vision. Proximal   neuropathy  Sudden, severe pain in the hip, thigh, or buttocks. Pain may spread from the back into the legs (sciatica).  Pain and numbness in the arms and legs.   Tingling.  Loss of bladder control or bowel control.  Weakness and wasting of thigh muscles.  Difficulty getting up from a seated position.  Abdominal swelling.  Unexplained weight loss. How is this diagnosed? Diagnosis usually involves reviewing your medical history and any symptoms you have. Diagnosis varies depending on the type of neuropathy your health care provider suspects. Peripheral neuropathy Your health care provider will check areas that are affected by your nervous system (neurologic exam), such as your reflexes, how you move, and what you can feel. You may have other tests, such as:  Blood tests.  Removal and examination of fluid that surrounds the spinal cord (lumbar puncture).  CT scan.  MRI.  A test to check the nerves that control muscles (electromyogram, EMG).  Tests of how quickly messages pass through your nerves (nerve conduction velocity tests).  Removal of a small piece of nerve to be examined under a microscope (biopsy). Autonomic neuropathy You may have tests, such as:  Tests to measure your blood pressure and heart rate. This may include monitoring you while you are safely secured to an exam table that moves you from a lying position to an upright position (table tilt test).  Breathing tests to check your lungs.  Tests to check how food moves through the digestive system (gastric emptying tests).  Blood, sweat, or urine tests.  Ultrasound of your bladder.  Spinal fluid tests. Focal neuropathy This condition may be diagnosed with:  A neurologic exam.  CT scan.  MRI.  EMG.  Nerve conduction velocity tests. Proximal neuropathy There is no test to diagnose this type of neuropathy. You may have tests to rule out other possible causes of this type of neuropathy. Tests may include:  X-rays of your spine and lumbar region.  Lumbar puncture.  MRI. How is this treated? The goal of treatment is to keep nerve damage from getting worse.  The most important part of treatment is keeping your blood glucose level and your A1C level within your target range by following your diabetes management plan. Over time, maintaining lower blood glucose levels helps lessen symptoms. In some cases, you may need prescription pain medicine. Follow these instructions at home:  Lifestyle   Do not use any products that contain nicotine or tobacco, such as cigarettes and e-cigarettes. If you need help quitting, ask your health care provider.  Be physically active every day. Include strength training and balance exercises.  Follow a healthy meal plan.  Work with your health care provider to manage your blood pressure. General instructions  Follow your diabetes management plan as directed. ? Check your blood glucose levels as directed by your health care provider. ? Keep your blood glucose in your target range as directed by your health care provider. ? Have your A1C level checked at least two times a year, or as often as told by your health care provider.  Take over the counter and prescription medicines only as told by your health care provider. This includes insulin and diabetes medicine.  Do not drive or use heavy machinery while taking prescription pain medicines.  Check your skin and feet every day for cuts, bruises, redness, blisters, or sores.  Keep all follow up visits as told by your health care provider. This is important. Contact a health care provider if:    You have burning, stabbing, or aching pain in your legs or feet.  You are unable to feel pressure or pain in your feet.  You develop problems with digestion, such as: ? Nausea. ? Vomiting. ? Bloating. ? Constipation. ? Diarrhea. ? Abdominal pain.  You have difficulty with urination, such as inability: ? To control when you urinate (incontinence). ? To completely empty the bladder (retention).  You have palpitations.  You feel dizzy, weak, or faint when you stand  up. Get help right away if:  You cannot urinate.  You have sudden weakness or loss of coordination.  You have trouble speaking.  You have pain or pressure in your chest.  You have an irregular heart beat.  You have sudden inability to move a part of your body. Summary  Diabetic neuropathy refers to nerve damage that is caused by diabetes. It can affect nerves throughout the entire body, causing numbness and pain in the arms, legs, digestive tract, heart, and other body systems.  Keep your blood glucose level and your blood pressure in your target range, as directed by your health care provider. This can help prevent neuropathy from getting worse.  Check your skin and feet every day for cuts, bruises, redness, blisters, or sores.  Do not use any products that contain nicotine or tobacco, such as cigarettes and e-cigarettes. If you need help quitting, ask your health care provider. This information is not intended to replace advice given to you by your health care provider. Make sure you discuss any questions you have with your health care provider. Document Released: 06/02/2001 Document Revised: 05/06/2017 Document Reviewed: 04/28/2016 Elsevier Interactive Patient Education  2019 [employer]  Onychomycosis/Fungal Toenails  WHAT IS IT? An infection that lies within the keratin of your nail plate that is caused by a fungus.  WHY ME? Fungal infections affect all ages, sexes, races, and creeds.  There may be many factors that predispose you to a fungal infection such as age, coexisting medical conditions such as diabetes, or an autoimmune disease; stress, medications, fatigue, genetics, etc.  Bottom line: fungus thrives in a warm, moist environment and your shoes offer such a location.  IS IT CONTAGIOUS? Theoretically, yes.  You do not want to share shoes, nail clippers or files with someone who has fungal toenails.  Walking around barefoot in the same room or sleeping in the same bed  is unlikely to transfer the organism.  It is important to realize, however, that fungus can spread easily from one nail to the next on the same foot.  HOW DO WE TREAT THIS?  There are several ways to treat this condition.  Treatment may depend on many factors such as age, medications, pregnancy, liver and kidney conditions, etc.  It is best to ask your doctor which options are available to you.  1. No treatment.   Unlike many other medical concerns, you can live with this condition.  However for many people this can be a painful condition and may lead to ingrown toenails or a bacterial infection.  It is recommended that you keep the nails cut short to help reduce the amount of fungal nail. 2. Topical treatment.  These range from herbal remedies to prescription strength nail lacquers.  About 40-50% effective, topicals require twice daily application for approximately 9 to 12 months or until an entirely new nail has grown out.  The most effective topicals are medical grade medications available through physicians offices. 3. Oral antifungal medications.  With an 80-90%  cure rate, the most common oral medication requires 3 to 4 months of therapy and stays in your system for a year as the new nail grows out.  Oral antifungal medications do require blood work to make sure it is a safe drug for you.  A liver function panel will be performed prior to starting the medication and after the first month of treatment.  It is important to have the blood work performed to avoid any harmful side effects.  In general, this medication safe but blood work is required. 4. Laser Therapy.  This treatment is performed by applying a specialized laser to the affected nail plate.  This therapy is noninvasive, fast, and non-painful.  It is not covered by insurance and is therefore, out of pocket.  The results have been very good with a 80-95% cure rate.  The [facility] is the only practice in the area to offer this therapy. 5.  Permanent Nail Avulsion.  Removing the entire nail so that a new nail will not grow back.

## 2018-08-24 NOTE — Progress Notes (Signed)
Subjective: Zachary Hoffman presents today for preventative diabetic foot care.   Patient states he has no new pedal concerns on today's visit.   [name], MD is his PCP.    Current Outpatient Medications:  .  amLODipine (NORVASC) 2.5 MG tablet, TK 1 T PO QD, Disp: , Rfl:  .  aspirin EC 81 MG tablet, Take 81 mg by mouth daily., Disp: , Rfl:  .  Calcium Carbonate (CALCIUM 600 PO), Take 600 mg by mouth daily., Disp: , Rfl:  .  cetirizine (ZYRTEC) 10 MG tablet, TAKE 1 TABLET EVERY DAY AS NEEDED, Disp: , Rfl: 5 .  clopidogrel (PLAVIX) 75 MG tablet, Take 75 mg by mouth daily., Disp: , Rfl:  .  clotrimazole-betamethasone (LOTRISONE) cream, clotrimazole-betamethasone 1 %-0.05 % topical cream  APPLY BID AS NEEDED, Disp: , Rfl:  .  doxazosin (CARDURA) 4 MG tablet, TK 1 T PO QD, Disp: , Rfl: 2 .  fluticasone (FLONASE) 50 MCG/ACT nasal spray, Place 2 sprays into both nostrils daily as needed for allergies. , Disp: , Rfl: 0 .  furosemide (LASIX) 40 MG tablet, Take 40 mg by mouth., Disp: , Rfl:  .  insulin glulisine (APIDRA) 100 UNIT/ML injection, Inject 10 Units into the skin 3 (three) times daily with meals. Reported on 06/29/2015, Disp: , Rfl:  .  Iron-Vitamins (GERITOL PO), Take 1 capsule by mouth every morning., Disp: , Rfl:  .  LANTUS SOLOSTAR 100 UNIT/ML Solostar Pen, Inject 45 Units into the skin daily. , Disp: , Rfl: 5 .  levothyroxine (SYNTHROID, LEVOTHROID) 100 MCG tablet, Take 100 mcg by mouth daily., Disp: , Rfl: 3 .  losartan (COZAAR) 25 MG tablet, TK 1 T PO QD, Disp: , Rfl:  .  metoprolol succinate (TOPROL-XL) 100 MG 24 hr tablet, Take 100 mg by mouth daily. Take with or immediately following a meal., Disp: , Rfl:  .  MITIGARE 0.6 MG CAPS, Take 0.6 mg by mouth daily as needed (gout attacks)., Disp: , Rfl:  .  mupirocin cream (BACTROBAN) 2 %, APPLY TO THE AFFECTED AREA OF SKIN TWICE DAILY, Disp: , Rfl: 2 .  NOVOLOG FLEXPEN 100 UNIT/ML FlexPen, INJECT 10 UNITS SQ TID AC, Disp: , Rfl:  5 .  oxyCODONE-acetaminophen (PERCOCET/ROXICET) 5-325 MG tablet, TK 1 T PO  BID PRF ACUTE PAIN., Disp: , Rfl:  .  pantoprazole (PROTONIX) 40 MG tablet, Take 40 mg by mouth daily. Reported on 06/29/2015, Disp: , Rfl:  .  pregabalin (LYRICA) 150 MG capsule, TK ONE C PO TID, Disp: , Rfl:  .  PRODIGY NO CODING BLOOD GLUC test strip, U TID, Disp: , Rfl:  .  simvastatin (ZOCOR) 20 MG tablet, Take 20 mg by mouth every evening., Disp: , Rfl:  .  tiZANidine (ZANAFLEX) 4 MG tablet, TK 1 T PO BID PRN, Disp: , Rfl: 2 .  Vitamin D, Ergocalciferol, (DRISDOL) 1.25 MG (50000 UT) CAPS capsule, TK 1 C PO WEEKLY, Disp: , Rfl:   Current Facility-Administered Medications:  .  triamcinolone acetonide (KENALOG) 10 MG/ML injection 10 mg, 10 mg, Other, Once, [place], [place], DPM  No Known Allergies  Objective: Vitals:   08/24/18 0855  Temp: 97.7 F (36.5 C)   Vascular Examination: Capillary refill time <3 seconds x 10 digits.  Dorsalis pedis pulses 1/4 b/l.  Posterior tibial pulses 1/4 b/l.  No digital hair x 10 digits.  Skin temperature WNL b/l.  Dermatological Examination: Skin with normal turgor, texture and tone b/l.  Toenails 1-5 b/l discolored,  thick, dystrophic with subungual debris and pain with palpation to nailbeds due to thickness of nails.  Hyperkeratotic lesions b/l medial heels and submet head 5 b/l. No erythema, no edema, no drainage, no flocculence noted.   No open wounds.  No interdigital macerations noted.  Musculoskeletal: Muscle strength 5/5 to all LE muscle groups.  Neurological: Sensation diminished with 10 gram monofilament.  Vibratory sensation diminished.  Last A1c was 8.4%  Assessment: 1. Painful onychomycosis toenails 1-5 b/l 2. Calluses b/l medial heels and submet head 5 b/l. 3. NIDDM with Diabetic neuropathy  Plan: 1. Continue diabetic foot care principles. Literature dispensed. 2. Toenails 1-5 b/l were debrided in length and girth without iatrogenic  bleeding. 3. Calluses pared b/l medial heels and submet head 5 b/l.utilizing sterile scalpel blade without incident. Patient to continue soft, supportive shoe gear daily. 4. Patient to report any pedal injuries to medical professional immediately. 5. Follow up 3 months.  6. Patient/POA to call should there be a concern in the interim.

## 2018-11-24 ENCOUNTER — Other Ambulatory Visit: Payer: Self-pay

## 2018-11-24 ENCOUNTER — Ambulatory Visit (INDEPENDENT_AMBULATORY_CARE_PROVIDER_SITE_OTHER): Payer: Medicare Other | Admitting: Podiatry

## 2018-11-24 ENCOUNTER — Encounter: Payer: Self-pay | Admitting: Podiatry

## 2018-11-24 DIAGNOSIS — M79676 Pain in unspecified toe(s): Secondary | ICD-10-CM | POA: Diagnosis not present

## 2018-11-24 DIAGNOSIS — L84 Corns and callosities: Secondary | ICD-10-CM | POA: Diagnosis not present

## 2018-11-24 DIAGNOSIS — B351 Tinea unguium: Secondary | ICD-10-CM | POA: Diagnosis not present

## 2018-11-24 DIAGNOSIS — E1140 Type 2 diabetes mellitus with diabetic neuropathy, unspecified: Secondary | ICD-10-CM

## 2018-11-24 NOTE — Patient Instructions (Addendum)
[employer]  Diabetes Mellitus and Foot Care Foot care is an important part of your health, especially when you have diabetes. Diabetes may cause you to have problems because of poor blood flow (circulation) to your feet and legs, which can cause your skin to:  Become thinner and drier.  Break more easily.  Heal more slowly.  Peel and crack. You may also have nerve damage (neuropathy) in your legs and feet, causing decreased feeling in them. This means that you may not notice minor injuries to your feet that could lead to more serious problems. Noticing and addressing any potential problems early is the best way to prevent future foot problems. How to care for your feet Foot hygiene  Wash your feet daily with warm water and mild soap. Do not use hot water. Then, pat your feet and the areas between your toes until they are completely dry. Do not soak your feet as this can dry your skin.  Trim your toenails straight across. Do not dig under them or around the cuticle. File the edges of your nails with an emery board or nail file.  Apply a moisturizing lotion or petroleum jelly to the skin on your feet and to dry, brittle toenails. Use lotion that does not contain alcohol and is unscented. Do not apply lotion between your toes. Shoes and socks  Wear clean socks or stockings every day. Make sure they are not too tight. Do not wear knee-high stockings since they may decrease blood flow to your legs.  Wear shoes that fit properly and have enough cushioning. Always look in your shoes before you put them on to be sure there are no objects inside.  To break in new shoes, wear them for just a few hours a day. This prevents injuries on your feet. Wounds, scrapes, corns, and calluses  Check your feet daily for blisters, cuts, bruises, sores, and redness. If you cannot see the bottom of your feet, use a mirror or ask someone for help.  Do not cut corns or calluses or try to remove them  with medicine.  If you find a minor scrape, cut, or break in the skin on your feet, keep it and the skin around it clean and dry. You may clean these areas with mild soap and water. Do not clean the area with peroxide, alcohol, or iodine.  If you have a wound, scrape, corn, or callus on your foot, look at it several times a day to make sure it is healing and not infected. Check for: ? Redness, swelling, or pain. ? Fluid or blood. ? Warmth. ? Pus or a bad smell. General instructions  Do not cross your legs. This may decrease blood flow to your feet.  Do not use heating pads or hot water bottles on your feet. They may burn your skin. If you have lost feeling in your feet or legs, you may not know this is happening until it is too late.  Protect your feet from hot and cold by wearing shoes, such as at the beach or on hot pavement.  Schedule a complete foot exam at least once a year (annually) or more often if you have foot problems. If you have foot problems, report any cuts, sores, or bruises to your health care provider immediately. Contact a health care provider if:  You have a medical condition that increases your risk of infection and you have any cuts, sores, or bruises on your feet.  You have an  injury that is not healing.  You have redness on your legs or feet.  You feel burning or tingling in your legs or feet.  You have pain or cramps in your legs and feet.  Your legs or feet are numb.  Your feet always feel cold.  You have pain around a toenail. Get help right away if:  You have a wound, scrape, corn, or callus on your foot and: ? You have pain, swelling, or redness that gets worse. ? You have fluid or blood coming from the wound, scrape, corn, or callus. ? Your wound, scrape, corn, or callus feels warm to the touch. ? You have pus or a bad smell coming from the wound, scrape, corn, or callus. ? You have a fever. ? You have a red line going up your leg. Summary   Check your feet every day for cuts, sores, red spots, swelling, and blisters.  Moisturize feet and legs daily.  Wear shoes that fit properly and have enough cushioning.  If you have foot problems, report any cuts, sores, or bruises to your health care provider immediately.  Schedule a complete foot exam at least once a year (annually) or more often if you have foot problems. This information is not intended to replace advice given to you by your health care provider. Make sure you discuss any questions you have with your health care provider. Document Released: 03/21/2000 Document Revised: 05/06/2017 Document Reviewed: 04/25/2016 Elsevier Patient Education  [place] are small areas of thickened skin that occur on the top, sides, or tip of a toe. They contain a cone-shaped core with a point that can press on a nerve below. This causes pain.  Calluses are areas of thickened skin that can occur anywhere on the body, including the hands, fingers, palms, soles of the feet, and heels. Calluses are usually larger than corns. What are the causes? Corns and calluses are caused by rubbing (friction) or pressure, such as from shoes that are too tight or do not fit properly. What increases the risk? Corns are more likely to develop in people who have misshapen toes (toe deformities), such as hammer toes. Calluses can occur with friction to any area of the skin. They are more likely to develop in people who:  Work with their hands.  Wear shoes that fit poorly, are too tight, or are high-heeled.  Have toe deformities. What are the signs or symptoms? Symptoms of a corn or callus include:  A hard growth on the skin.  Pain or tenderness under the skin.  Redness and swelling.  Increased discomfort while wearing tight-fitting shoes, if your feet are affected. If a corn or callus becomes infected, symptoms may include:  Redness and swelling that gets worse.   Pain.  Fluid, blood, or pus draining from the corn or callus. How is this diagnosed? Corns and calluses may be diagnosed based on your symptoms, your medical history, and a physical exam. How is this treated? Treatment for corns and calluses may include:  Removing the cause of the friction or pressure. This may involve: ? Changing your shoes. ? Wearing shoe inserts (orthotics) or other protective layers in your shoes, such as a corn pad. ? Wearing gloves.  Applying medicine to the skin (topical medicine) to help soften skin in the hardened, thickened areas.  Removing layers of dead skin with a file to reduce the size of the corn or callus.  Removing the corn or  callus with a scalpel or laser.  Taking antibiotic medicines, if your corn or callus is infected.  Having surgery, if a toe deformity is the cause. Follow these instructions at home:   Take over-the-counter and prescription medicines only as told by your health care provider.  If you were prescribed an antibiotic, take it as told by your health care provider. Do not stop taking it even if your condition starts to improve.  Wear shoes that fit well. Avoid wearing high-heeled shoes and shoes that are too tight or too loose.  Wear any padding, protective layers, gloves, or orthotics as told by your health care provider.  Soak your hands or feet and then use a file or pumice stone to soften your corn or callus. Do this as told by your health care provider.  Check your corn or callus every day for symptoms of infection. Contact a health care provider if you:  Notice that your symptoms do not improve with treatment.  Have redness or swelling that gets worse.  Notice that your corn or callus becomes painful.  Have fluid, blood, or pus coming from your corn or callus.  Have new symptoms. Summary  Corns are small areas of thickened skin that occur on the top, sides, or tip of a toe.  Calluses are areas of thickened  skin that can occur anywhere on the body, including the hands, fingers, palms, and soles of the feet. Calluses are usually larger than corns.  Corns and calluses are caused by rubbing (friction) or pressure, such as from shoes that are too tight or do not fit properly.  Treatment may include wearing any padding, protective layers, gloves, or orthotics as told by your health care provider. This information is not intended to replace advice given to you by your health care provider. Make sure you discuss any questions you have with your health care provider. Document Released: 12/29/2003 Document Revised: 07/14/2018 Document Reviewed: 02/04/2017 Elsevier Patient Education  [place]? An infection that lies within the keratin of your nail plate that is caused by a fungus.  WHY ME? Fungal infections affect all ages, sexes, races, and creeds.  There may be many factors that predispose you to a fungal infection such as age, coexisting medical conditions such as diabetes, or an autoimmune disease; stress, medications, fatigue, genetics, etc.  Bottom line: fungus thrives in a warm, moist environment and your shoes offer such a location.  IS IT CONTAGIOUS? Theoretically, yes.  You do not want to share shoes, nail clippers or files with someone who has fungal toenails.  Walking around barefoot in the same room or sleeping in the same bed is unlikely to transfer the organism.  It is important to realize, however, that fungus can spread easily from one nail to the next on the same foot.  HOW DO WE TREAT THIS?  There are several ways to treat this condition.  Treatment may depend on many factors such as age, medications, pregnancy, liver and kidney conditions, etc.  It is best to ask your doctor which options are available to you.  1. No treatment.   Unlike many other medical concerns, you can live with this condition.  However for many people this can be  a painful condition and may lead to ingrown toenails or a bacterial infection.  It is recommended that you keep the nails cut short to help reduce the amount of fungal nail. 2. Topical treatment.  These  range from herbal remedies to prescription strength nail lacquers.  About 40-50% effective, topicals require twice daily application for approximately 9 to 12 months or until an entirely new nail has grown out.  The most effective topicals are medical grade medications available through physicians offices. 3. Oral antifungal medications.  With an 80-90% cure rate, the most common oral medication requires 3 to 4 months of therapy and stays in your system for a year as the new nail grows out.  Oral antifungal medications do require blood work to make sure it is a safe drug for you.  A liver function panel will be performed prior to starting the medication and after the first month of treatment.  It is important to have the blood work performed to avoid any harmful side effects.  In general, this medication safe but blood work is required. 4. Laser Therapy.  This treatment is performed by applying a specialized laser to the affected nail plate.  This therapy is noninvasive, fast, and non-painful.  It is not covered by insurance and is therefore, out of pocket.  The results have been very good with a 80-95% cure rate.  The [facility] is the only practice in the area to offer this therapy. 5. Permanent Nail Avulsion.  Removing the entire nail so that a new nail will not grow back.

## 2018-12-02 NOTE — Progress Notes (Signed)
Subjective: Zachary Hoffman is a 78 y.o. y.o. male who presents today for preventative diabetic foot care with cc of painful, discolored, thick toenails and plantar calluses which interfere with daily activities. Pain is aggravated when wearing enclosed shoe gear and relieved with periodic professional debridement.  He voices no new pedal concerns on today's visit.  [name], MD is his PCP.    Current Outpatient Medications:  .  amLODipine (NORVASC) 2.5 MG tablet, TK 1 T PO QD, Disp: , Rfl:  .  aspirin EC 81 MG tablet, Take 81 mg by mouth daily., Disp: , Rfl:  .  Calcium Carbonate (CALCIUM 600 PO), Take 600 mg by mouth daily., Disp: , Rfl:  .  cetirizine (ZYRTEC) 10 MG tablet, TAKE 1 TABLET EVERY DAY AS NEEDED, Disp: , Rfl: 5 .  clopidogrel (PLAVIX) 75 MG tablet, Take 75 mg by mouth daily., Disp: , Rfl:  .  clotrimazole-betamethasone (LOTRISONE) cream, clotrimazole-betamethasone 1 %-0.05 % topical cream  APPLY BID AS NEEDED, Disp: , Rfl:  .  doxazosin (CARDURA) 4 MG tablet, TK 1 T PO QD, Disp: , Rfl: 2 .  fluticasone (FLONASE) 50 MCG/ACT nasal spray, Place 2 sprays into both nostrils daily as needed for allergies. , Disp: , Rfl: 0 .  furosemide (LASIX) 40 MG tablet, Take 40 mg by mouth., Disp: , Rfl:  .  hydrALAZINE (APRESOLINE) 50 MG tablet, TK 1 T PO BID, Disp: , Rfl:  .  insulin glulisine (APIDRA) 100 UNIT/ML injection, Inject 10 Units into the skin 3 (three) times daily with meals. Reported on 06/29/2015, Disp: , Rfl:  .  Iron-Vitamins (GERITOL PO), Take 1 capsule by mouth every morning., Disp: , Rfl:  .  LANTUS SOLOSTAR 100 UNIT/ML Solostar Pen, Inject 45 Units into the skin daily. , Disp: , Rfl: 5 .  levothyroxine (SYNTHROID, LEVOTHROID) 100 MCG tablet, Take 100 mcg by mouth daily., Disp: , Rfl: 3 .  losartan (COZAAR) 25 MG tablet, TK 1 T PO QD, Disp: , Rfl:  .  losartan (COZAAR) 50 MG tablet, TK 1 T PO QD, Disp: , Rfl:  .  metoprolol succinate (TOPROL-XL) 100 MG 24 hr tablet,  Take 100 mg by mouth daily. Take with or immediately following a meal., Disp: , Rfl:  .  MITIGARE 0.6 MG CAPS, Take 0.6 mg by mouth daily as needed (gout attacks)., Disp: , Rfl:  .  mupirocin cream (BACTROBAN) 2 %, APPLY TO THE AFFECTED AREA OF SKIN TWICE DAILY, Disp: , Rfl: 2 .  NOVOLOG FLEXPEN 100 UNIT/ML FlexPen, INJECT 10 UNITS SQ TID AC, Disp: , Rfl: 5 .  oxyCODONE-acetaminophen (PERCOCET/ROXICET) 5-325 MG tablet, TK 1 T PO  BID PRF ACUTE PAIN., Disp: , Rfl:  .  pantoprazole (PROTONIX) 40 MG tablet, Take 40 mg by mouth daily. Reported on 06/29/2015, Disp: , Rfl:  .  pregabalin (LYRICA) 150 MG capsule, TK ONE C PO TID, Disp: , Rfl:  .  PRODIGY NO CODING BLOOD GLUC test strip, U TID, Disp: , Rfl:  .  simvastatin (ZOCOR) 20 MG tablet, Take 20 mg by mouth every evening., Disp: , Rfl:  .  tiZANidine (ZANAFLEX) 4 MG tablet, TK 1 T PO BID PRN, Disp: , Rfl: 2 .  Vitamin D, Ergocalciferol, (DRISDOL) 1.25 MG (50000 UT) CAPS capsule, TK 1 C PO WEEKLY, Disp: , Rfl:   Current Facility-Administered Medications:  .  triamcinolone acetonide (KENALOG) 10 MG/ML injection 10 mg, 10 mg, Other, Once, Stover, Titorya, DPM  No Known Allergies  Objective:  Vascular Examination: Capillary refill time <3 seconds x 10 digits.  Dorsalis pedis pulses and Posterior tibial pulses 1/4 b/l.  Digital hair absent x 10 digits.  Skin temperature gradient WNL b/l.  Dermatological Examination: Skin with normal turgor, texture and tone b/l.  Toenails 1-5 b/l discolored, thick, dystrophic with subungual debris and pain with palpation to nailbeds due to thickness of nails.  Hyperkeratotic lesion b/l heels and submet head 5 b/l. No erythema, no edema, no drainage, no flocculence noted.   No open wounds b/l.  Musculoskeletal: Muscle strength 5/5 b/l to all LE muscle groups.  Neurological: Sensation diminished b/l with 10 gram monofilament.  Vibratory sensation diminished b/l.  Assessment: 1. Painful  onychomycosis toenails 1-5 b/l 2.  Calluses b/l heels and submet head 5 b/l 3.  NIDDM with neuropathy  Plan: 1. Continue diabetic foot care principles. Literature dispensed on today. 2. Toenails 1-5 b/l were debrided in length and girth without iatrogenic bleeding. 3. Hyperkeratotic lesion(s) b/l heels and submet head 5 b/l pared with sterile scalpel blade without incident. 4. Patient to continue soft, supportive shoe gear daily. 5. Patient to report any pedal injuries to medical professional immediately. 6. Follow up 3 months.  7. Patient/POA to call should there be a concern in the interim.

## 2018-12-21 ENCOUNTER — Other Ambulatory Visit: Payer: Self-pay

## 2018-12-21 ENCOUNTER — Other Ambulatory Visit (HOSPITAL_COMMUNITY): Payer: Self-pay | Admitting: Cardiovascular Disease

## 2018-12-21 ENCOUNTER — Ambulatory Visit (HOSPITAL_COMMUNITY)
Admission: RE | Admit: 2018-12-21 | Discharge: 2018-12-21 | Disposition: A | Discharge status: Home / Self Care | Payer: Medicare Other | Source: Ambulatory Visit | Attending: Cardiovascular Disease | Admitting: Cardiovascular Disease

## 2018-12-21 DIAGNOSIS — I739 Peripheral vascular disease, unspecified: Secondary | ICD-10-CM | POA: Insufficient documentation

## 2018-12-21 DIAGNOSIS — T82392D Other mechanical complication of femoral arterial graft (bypass), subsequent encounter: Secondary | ICD-10-CM

## 2019-02-16 ENCOUNTER — Telehealth (INDEPENDENT_AMBULATORY_CARE_PROVIDER_SITE_OTHER): Payer: Medicare Other | Admitting: Cardiovascular Disease

## 2019-02-16 ENCOUNTER — Telehealth: Payer: Self-pay | Admitting: *Deleted

## 2019-02-16 ENCOUNTER — Encounter: Payer: Self-pay | Admitting: Cardiovascular Disease

## 2019-02-16 DIAGNOSIS — E785 Hyperlipidemia, unspecified: Secondary | ICD-10-CM | POA: Diagnosis not present

## 2019-02-16 DIAGNOSIS — Z720 Tobacco use: Secondary | ICD-10-CM

## 2019-02-16 DIAGNOSIS — N184 Chronic kidney disease, stage 4 (severe): Secondary | ICD-10-CM

## 2019-02-16 DIAGNOSIS — G4733 Obstructive sleep apnea (adult) (pediatric): Secondary | ICD-10-CM

## 2019-02-16 DIAGNOSIS — I2510 Atherosclerotic heart disease of native coronary artery without angina pectoris: Secondary | ICD-10-CM | POA: Diagnosis not present

## 2019-02-16 DIAGNOSIS — Z794 Long term (current) use of insulin: Secondary | ICD-10-CM

## 2019-02-16 DIAGNOSIS — I739 Peripheral vascular disease, unspecified: Secondary | ICD-10-CM

## 2019-02-16 DIAGNOSIS — I10 Essential (primary) hypertension: Secondary | ICD-10-CM | POA: Diagnosis not present

## 2019-02-16 DIAGNOSIS — E1122 Type 2 diabetes mellitus with diabetic chronic kidney disease: Secondary | ICD-10-CM | POA: Diagnosis not present

## 2019-02-16 DIAGNOSIS — Z9989 Dependence on other enabling machines and devices: Secondary | ICD-10-CM

## 2019-02-16 NOTE — Progress Notes (Signed)
Virtual Visit via Video Note   This visit type was conducted due to national recommendations for restrictions regarding the COVID-19 Pandemic (e.g. social distancing) in an effort to limit this patient's exposure and mitigate transmission in our community.  Due to his co-morbid illnesses, this patient is at least at moderate risk for complications without adequate follow up.  This format is felt to be most appropriate for this patient at this time.  All issues noted in this document were discussed and addressed.  A limited physical exam was performed with this format.  Please refer to the patient's chart for his consent to telehealth for [facility].   Date:  02/16/2019   ID:  Zachary Hoffman, DOB [birth year], MRN [id number]  Patient Location: Home Provider Location: Home  PCP:  [name], MD  Cardiologist:  Piotr Christopher Electrophysiologist:  None   Evaluation Performed:  Follow-Up Visit  Chief Complaint:  CAD, PAD  History of Present Illness:    Zachary Hoffman is a 78 y.o. male with CAD, PAD, still smoking, DM type 2 on insulin, CKD stage 3-4, gout, OSA, obesity.  Physical activity is limited by low back pain.  He does not have angina or claudication for his level of activity.  He denies dyspnea at rest or with activity, but develops nasal congestion when he lies in bed.  He describes it as sinus problems and it does not sound like true orthopnea.  He does have problems with lower extremity edema.  He reports that when he last saw his nephrologist, Dr. Carolin Sicks, his kidney tests had surprisingly improved.  He has another visit with him scheduled in December.  He believes that Dr. Jeanie Cooks has checked his lipid profile and hemoglobin A1c this year, but I do not have a copy of those results.  He has not had recent gout attacks.  He continues to smoke and has no intention of quitting.  He has an extensive history of peripheral arterial disease. In 2009 he had a left common femoral to  superficial femoral artery bypass procedure for occlusive disease. He also has a history of coronary artery disease and has previously received a stent to the LAD (2004, [place], no details known). His most recent lower extremity arterial Doppler study was performed in  September 2020. The femoral to femoral bypass graft was widely patent with normal velocities, three-vessel runoff. ABI normal bilaterally (1.1 on on the right, 1.03 on the left). On the right side the anterior tibial artery is occluded. Normal LV function by nuclear stress in 2012: EF 64%, normal myocardial perfusion.  The patient does not have symptoms concerning for COVID-19 infection (fever, chills, cough, or new shortness of breath).    Past Medical History:  Diagnosis Date   Asthma    CHF (congestive heart failure) ([place])    Echo 07/2006 with normal LV fxn, and diastolic dysfxn   Coronary artery disease    Diabetes mellitus    On insulin    Diverticulosis 05/2009   Noted on screening colo    Dyslipidemia    Eczema    Headache(784.0)    Hypertension    Kidney disease, chronic, stage II (GFR 60-89 ml/min)    MI (myocardial infarction) ([place])    S/p stenting per patient. Last stress 04/2007 negative    Neuropathy    Pancreatitis    P/H   Peripheral arterial disease ([place])    S/p left femoro-femoral bypass. LE cath by Dr. Einar Gip 09/2007 without significant stenosis  Tobacco abuse    Past Surgical History:  Procedure Laterality Date   ADENOIDECTOMY     CORONARY ANGIOPLASTY WITH STENT PLACEMENT  2004   Mid LAD, 95% first obtuse marginal    DOPPLER ECHOCARDIOGRAPHY  07/28/2006   Mod. LVH,EF =>55%,LA mildly dilated,mitral annular ca+,AOV mildly sclerotic,trace AI   FEMORAL-FEMORAL BYPASS GRAFT     In [place], on the left. Patent on cath 09/2007   LOWER EXTREMITY ANGIOGRAM  09/20/2007   no significant stenosis   Nuclear Stress Test  09/17/2010   No ischemia   REPLACEMENT TOTAL HIP W/  RESURFACING  IMPLANTS     Left   TONSILLECTOMY       Current Meds  Medication Sig   amLODipine (NORVASC) 2.5 MG tablet TK 1 T PO QD   aspirin EC 81 MG tablet Take 81 mg by mouth daily.   Calcium Carbonate (CALCIUM 600 PO) Take 600 mg by mouth daily.   cetirizine (ZYRTEC) 10 MG tablet TAKE 1 TABLET EVERY DAY AS NEEDED   clopidogrel (PLAVIX) 75 MG tablet Take 75 mg by mouth daily.   clotrimazole-betamethasone (LOTRISONE) cream clotrimazole-betamethasone 1 %-0.05 % topical cream  APPLY BID AS NEEDED   doxazosin (CARDURA) 4 MG tablet TK 1 T PO QD   fluticasone (FLONASE) 50 MCG/ACT nasal spray Place 2 sprays into both nostrils daily as needed for allergies.    furosemide (LASIX) 40 MG tablet Take 40 mg by mouth.   hydrALAZINE (APRESOLINE) 50 MG tablet TK 1 T PO BID   insulin glulisine (APIDRA) 100 UNIT/ML injection Inject 10 Units into the skin 3 (three) times daily with meals. Reported on 06/29/2015   Iron-Vitamins (GERITOL PO) Take 1 capsule by mouth every morning.   LANTUS SOLOSTAR 100 UNIT/ML Solostar Pen Inject 45 Units into the skin daily.    levothyroxine (SYNTHROID, LEVOTHROID) 100 MCG tablet Take 100 mcg by mouth daily.   losartan (COZAAR) 50 MG tablet TK 1 T PO QD   metoprolol succinate (TOPROL-XL) 100 MG 24 hr tablet Take 100 mg by mouth daily. Take with or immediately following a meal.   pantoprazole (PROTONIX) 40 MG tablet Take 40 mg by mouth daily. Reported on 06/29/2015   simvastatin (ZOCOR) 20 MG tablet Take 20 mg by mouth every evening.   Current Facility-Administered Medications for the 02/16/19 encounter (Telemedicine) with Shekelia Boutin, Dani Gobble, MD  Medication   triamcinolone acetonide (KENALOG) 10 MG/ML injection 10 mg     Allergies:   Patient has no known allergies.   Social History   Tobacco Use   Smoking status: Current Some Day Smoker    Packs/day: 0.50    Years: 50.00    Pack years: 25.00    Types: Cigarettes   Smokeless tobacco: Never Used  Substance  Use Topics   Alcohol use: No   Drug use: No     Family Hx: The patient's family history includes Asthma in his son; Cancer in his father; Diabetes in his father; Eczema in his daughter; Multiple sclerosis in his daughter; Stroke in his mother. There is no history of Allergic rhinitis, Angioedema, Immunodeficiency, or Urticaria.  ROS:   Please see the history of present illness.     All other systems reviewed and are negative.   Prior CV studies:   The following studies were reviewed today:  Lower extremity arterial duplex September 2020  Labs/Other Tests and Data Reviewed:    EKG:  An ECG dated 11/26/2017 was personally reviewed today and demonstrated:  Sinus bradycardia  otherwise normal tracing  Recent Labs: No results found for requested labs within last 8760 hours.   Recent Lipid Panel Lab Results  Component Value Date/Time   CHOL 106 11/26/2017 09:55 AM   TRIG 75 11/26/2017 09:55 AM   HDL 26 (L) 11/26/2017 09:55 AM   CHOLHDL 4.1 11/26/2017 09:55 AM   CHOLHDL 4.7 02/22/2015 12:22 PM   LDLCALC 65 11/26/2017 09:55 AM    Wt Readings from Last 3 Encounters:  02/16/19 210 lb (95.3 kg)  11/26/17 177 lb 9.6 oz (80.6 kg)  10/31/16 192 lb 6.4 oz (87.3 kg)     Objective:    Vital Signs:  BP (!) 155/87    Pulse 65    Ht 5\' 7"  (1.702 m)    Wt 210 lb (95.3 kg)    BMI 32.89 kg/m    VITAL SIGNS:  reviewed GEN:  no acute distress EYES:  sclerae anicteric, EOMI - Extraocular Movements Intact RESPIRATORY:  normal respiratory effort, symmetric expansion CARDIOVASCULAR:  lower extremity edema noted SKIN:  no rash, lesions or ulcers. MUSCULOSKELETAL:  no obvious deformities. NEURO:  alert and oriented x 3, no obvious focal deficit PSYCH:  normal affect  ASSESSMENT & PLAN:    1. CAD: Asymptomatic/no angina.  Last functional study was a nuclear test in 2012.  Very high threshold for performing contrast based procedure and ischemic disease. 2. PAD: Femorofemoral bypass patent  by recent Doppler.  Denies claudication. 3. HLP: Need labs.  Target LDL less than 70.  If they have not been performed this year will ask his nephrologist to draw them when he comes in for his appointment next month. 4. HTN: Target blood pressure less than 130/80.  He describes his blood pressure as "fluctuating" but it does not sound like it has ever low.  I am inclined to increase his losartan (he already has edema and this will worsen with amlodipine or hydralazine and higher doses, relative bradycardia precludes more beta-blocker), but will wait until he has appointment with his nephrologist and updated renal labs. 5. Smoking: I have tried hard to convince him about the need to quit smoking.  He does not seem worried.  [place] his tobacco [place] to [facility]. 6. DM type 2, insulin requiring: I do not have his most recent labs, but he describes his sugar as "controlled". 7. CKD: Now seeing Dr. Carolin Sicks.  It sounds like we will he could benefit from a higher dose of diuretic, but will also defer this to the nephrologist based on upcoming labs.  COVID-19 Education: The signs and symptoms of COVID-19 were discussed with the patient and how to seek care for testing (follow up with PCP or arrange E-visit).  The importance of social distancing was discussed today.  Time:   Today, I have spent 22 minutes with the patient with telehealth technology discussing the above problems.     Medication Adjustments/Labs and Tests Ordered: Current medicines are reviewed at length with the patient today.  Concerns regarding medicines are outlined above.   Tests Ordered: No orders of the defined types were placed in this encounter.   Medication Changes: No orders of the defined types were placed in this encounter.   Follow Up:  In Person 1 year  Signed, Sanda Klein, MD  02/16/2019 8:47 AM    [place]

## 2019-02-16 NOTE — Telephone Encounter (Signed)
Virtual Visit Pre-Appointment Phone Call  "(Name), I am calling you today to discuss your upcoming appointment. We are currently trying to limit exposure to the virus that causes COVID-19 by seeing patients at home rather than in the office."  1. "What is the BEST phone number to call the day of the visit?" - include this in appointment notes  2. "Do you have or have access to (through a family member/friend) a smartphone with video capability that we can use for your visit?" a. If yes - list this number in appt notes as "cell" (if different from BEST phone #) and list the appointment type as a VIDEO visit in appointment notes b. If no - list the appointment type as a PHONE visit in appointment notes  3. Confirm consent - "In the setting of the current Covid19 crisis, you are scheduled for a (phone or video) visit with your provider on (date) at (time).  Just as we do with many in-office visits, in order for you to participate in this visit, we must obtain consent.  If you'd like, I can send this to your mychart (if signed up) or email for you to review.  Otherwise, I can obtain your verbal consent now.  All virtual visits are billed to your insurance company just like a normal visit would be.  By agreeing to a virtual visit, we'd like you to understand that the technology does not allow for your provider to perform an examination, and thus may limit your provider's ability to fully assess your condition. If your provider identifies any concerns that need to be evaluated in person, we will make arrangements to do so.  Finally, though the technology is pretty good, we cannot assure that it will always work on either your or our end, and in the setting of a video visit, we may have to convert it to a phone-only visit.  In either situation, we cannot ensure that we have a secure connection.  Are you willing to proceed?" YES  4. Advise patient to be prepared - "Two hours prior to your appointment, go  ahead and check your blood pressure, pulse, oxygen saturation, and your weight (if you have the equipment to check those) and write them all down. When your visit starts, your provider will ask you for this information. If you have an Apple Watch or Kardia device, please plan to have heart rate information ready on the day of your appointment. Please have a pen and paper handy nearby the day of the visit as well."  5. Give patient instructions for MyChart download to smartphone OR Doximity/Doxy.me as below if video visit (depending on what platform provider is using)  6. Inform patient they will receive a phone call 15 minutes prior to their appointment time (may be from unknown caller ID) so they should be prepared to answer    TELEPHONE CALL NOTE  Zachary Hoffman has been deemed a candidate for a follow-up tele-health visit to limit community exposure during the Covid-19 pandemic. I spoke with the patient via phone to ensure availability of phone/video source, confirm preferred email & phone number, and discuss instructions and expectations.  I reminded Zachary Hoffman to be prepared with any vital sign and/or heart rhythm information that could potentially be obtained via home monitoring, at the time of his visit. I reminded Zachary Hoffman to expect a phone call prior to his visit.  Ricci Barker, RN 02/16/2019 8:58 AM   INSTRUCTIONS  FOR DOWNLOADING THE MYCHART APP TO SMARTPHONE  - The patient must first make sure to have activated MyChart and know their login information - If Apple, go to [employer] and type in MyChart in the search bar and download the app. If Android, ask patient to go to [employer] and type in [place] in the search bar and download the app. The app is free but as with any other app downloads, their phone may require them to verify saved payment information or Apple/Android password.  - The patient will need to then log into the app with their MyChart username  and password, and select [facility] as their healthcare provider to link the account. When it is time for your visit, go to the MyChart app, find appointments, and click Begin Video Visit. Be sure to Select Allow for your device to access the Microphone and Camera for your visit. You will then be connected, and your provider will be with you shortly.  **If they have any issues connecting, or need assistance please contact MyChart service desk [phone])**  **If using a computer, in order to ensure the best quality for their visit they will need to use either of the following Internet Browsers: [employer], or Google Chrome**  IF USING DOXIMITY or DOXY.ME - The patient will receive a link just prior to their visit by text.     FULL LENGTH CONSENT FOR TELE-HEALTH VISIT   I hereby voluntarily request, consent and authorize [facility] and its employed or contracted physicians, physician assistants, nurse practitioners or other licensed health care professionals (the Practitioner), to provide me with telemedicine health care services (the "Services") as deemed necessary by the treating Practitioner. I acknowledge and consent to receive the Services by the Practitioner via telemedicine. I understand that the telemedicine visit will involve communicating with the Practitioner through live audiovisual communication technology and the disclosure of certain medical information by electronic transmission. I acknowledge that I have been given the opportunity to request an in-person assessment or other available alternative prior to the telemedicine visit and am voluntarily participating in the telemedicine visit.  I understand that I have the right to withhold or withdraw my consent to the use of telemedicine in the course of my care at any time, without affecting my right to future care or treatment, and that the Practitioner or I may terminate the telemedicine visit at any time. I  understand that I have the right to inspect all information obtained and/or recorded in the course of the telemedicine visit and may receive copies of available information for a reasonable fee.  I understand that some of the potential risks of receiving the Services via telemedicine include:  Marland Kitchen Delay or interruption in medical evaluation due to technological equipment failure or disruption; . Information transmitted may not be sufficient (e.g. poor resolution of images) to allow for appropriate medical decision making by the Practitioner; and/or  . In rare instances, security protocols could fail, causing a breach of personal health information.  Furthermore, I acknowledge that it is my responsibility to provide information about my medical history, conditions and care that is complete and accurate to the best of my ability. I acknowledge that Practitioner's advice, recommendations, and/or decision may be based on factors not within their control, such as incomplete or inaccurate data provided by me or distortions of diagnostic images or specimens that may result from electronic transmissions. I understand that the practice of medicine is not an exact science and  that Practitioner makes no warranties or guarantees regarding treatment outcomes. I acknowledge that I will receive a copy of this consent concurrently upon execution via email to the email address I last provided but may also request a printed copy by calling the office of [facility].    I understand that my insurance will be billed for this visit.   I have read or had this consent read to me. . I understand the contents of this consent, which adequately explains the benefits and risks of the Services being provided via telemedicine.  . I have been provided ample opportunity to ask questions regarding this consent and the Services and have had my questions answered to my satisfaction. . I give my informed consent for the services to be  provided through the use of telemedicine in my medical care  By participating in this telemedicine visit I agree to the above.

## 2019-02-16 NOTE — Patient Instructions (Signed)
Medication Instructions:  No changes *If you need a refill on your cardiac medications before your next appointment, please call your pharmacy*  Lab Work: We will call your PCP for the most recent lab results. If they have not checked a Lipid profile, we will send you lab orders so that you may have this done at your nephrologsit .  Testing/Procedures: None ordered  Follow-Up: At [facility], you and your health needs are our priority.  As part of our continuing mission to provide you with exceptional heart care, we have created designated Provider Care Teams.  These Care Teams include your primary Cardiologist (physician) and Advanced Practice Providers (APPs -  Physician Assistants and Nurse Practitioners) who all work together to provide you with the care you need, when you need it.  Your next appointment:   12 months  The format for your next appointment:   In Person  Provider:   Sanda Klein, MD

## 2019-02-23 ENCOUNTER — Ambulatory Visit (INDEPENDENT_AMBULATORY_CARE_PROVIDER_SITE_OTHER): Payer: Medicare Other | Admitting: Podiatry

## 2019-02-23 ENCOUNTER — Encounter: Payer: Self-pay | Admitting: Podiatry

## 2019-02-23 ENCOUNTER — Other Ambulatory Visit: Payer: Self-pay

## 2019-02-23 DIAGNOSIS — B351 Tinea unguium: Secondary | ICD-10-CM

## 2019-02-23 DIAGNOSIS — M79676 Pain in unspecified toe(s): Secondary | ICD-10-CM

## 2019-02-23 DIAGNOSIS — E1140 Type 2 diabetes mellitus with diabetic neuropathy, unspecified: Secondary | ICD-10-CM

## 2019-02-23 DIAGNOSIS — L84 Corns and callosities: Secondary | ICD-10-CM | POA: Diagnosis not present

## 2019-02-23 NOTE — Patient Instructions (Signed)
Diabetes Mellitus and Foot Care Foot care is an important part of your health, especially when you have diabetes. Diabetes may cause you to have problems because of poor blood flow (circulation) to your feet and legs, which can cause your skin to:  Become thinner and drier.  Break more easily.  Heal more slowly.  Peel and crack. You may also have nerve damage (neuropathy) in your legs and feet, causing decreased feeling in them. This means that you may not notice minor injuries to your feet that could lead to more serious problems. Noticing and addressing any potential problems early is the best way to prevent future foot problems. How to care for your feet Foot hygiene  Wash your feet daily with warm water and mild soap. Do not use hot water. Then, pat your feet and the areas between your toes until they are completely dry. Do not soak your feet as this can dry your skin.  Trim your toenails straight across. Do not dig under them or around the cuticle. File the edges of your nails with an emery board or nail file.  Apply a moisturizing lotion or petroleum jelly to the skin on your feet and to dry, brittle toenails. Use lotion that does not contain alcohol and is unscented. Do not apply lotion between your toes. Shoes and socks  Wear clean socks or stockings every day. Make sure they are not too tight. Do not wear knee-high stockings since they may decrease blood flow to your legs.  Wear shoes that fit properly and have enough cushioning. Always look in your shoes before you put them on to be sure there are no objects inside.  To break in new shoes, wear them for just a few hours a day. This prevents injuries on your feet. Wounds, scrapes, corns, and calluses  Check your feet daily for blisters, cuts, bruises, sores, and redness. If you cannot see the bottom of your feet, use a mirror or ask someone for help.  Do not cut corns or calluses or try to remove them with medicine.  If you  find a minor scrape, cut, or break in the skin on your feet, keep it and the skin around it clean and dry. You may clean these areas with mild soap and water. Do not clean the area with peroxide, alcohol, or iodine.  If you have a wound, scrape, corn, or callus on your foot, look at it several times a day to make sure it is healing and not infected. Check for: ? Redness, swelling, or pain. ? Fluid or blood. ? Warmth. ? Pus or a bad smell. General instructions  Do not cross your legs. This may decrease blood flow to your feet.  Do not use heating pads or hot water bottles on your feet. They may burn your skin. If you have lost feeling in your feet or legs, you may not know this is happening until it is too late.  Protect your feet from hot and cold by wearing shoes, such as at the beach or on hot pavement.  Schedule a complete foot exam at least once a year (annually) or more often if you have foot problems. If you have foot problems, report any cuts, sores, or bruises to your health care provider immediately. Contact a health care provider if:  You have a medical condition that increases your risk of infection and you have any cuts, sores, or bruises on your feet.  You have an injury that is not   healing.  You have redness on your legs or feet.  You feel burning or tingling in your legs or feet.  You have pain or cramps in your legs and feet.  Your legs or feet are numb.  Your feet always feel cold.  You have pain around a toenail. Get help right away if:  You have a wound, scrape, corn, or callus on your foot and: ? You have pain, swelling, or redness that gets worse. ? You have fluid or blood coming from the wound, scrape, corn, or callus. ? Your wound, scrape, corn, or callus feels warm to the touch. ? You have pus or a bad smell coming from the wound, scrape, corn, or callus. ? You have a fever. ? You have a red line going up your leg. Summary  Check your feet every day  for cuts, sores, red spots, swelling, and blisters.  Moisturize feet and legs daily.  Wear shoes that fit properly and have enough cushioning.  If you have foot problems, report any cuts, sores, or bruises to your health care provider immediately.  Schedule a complete foot exam at least once a year (annually) or more often if you have foot problems. This information is not intended to replace advice given to you by your health care provider. Make sure you discuss any questions you have with your health care provider. Document Released: 03/21/2000 Document Revised: 05/06/2017 Document Reviewed: 04/25/2016 Elsevier Patient Education  2020 [employer]  

## 2019-02-28 ENCOUNTER — Emergency Department (HOSPITAL_COMMUNITY): Payer: Medicare Other

## 2019-02-28 ENCOUNTER — Encounter (HOSPITAL_COMMUNITY): Payer: Self-pay

## 2019-02-28 ENCOUNTER — Other Ambulatory Visit: Payer: Self-pay

## 2019-02-28 ENCOUNTER — Inpatient Hospital Stay (HOSPITAL_COMMUNITY)
Admission: EM | Admit: 2019-02-28 | Discharge: 2019-03-02 | DRG: 638 | Disposition: A | Discharge status: Home Health | Payer: Medicare Other | Attending: Internal Medicine | Admitting: Internal Medicine

## 2019-02-28 DIAGNOSIS — Z955 Presence of coronary angioplasty implant and graft: Secondary | ICD-10-CM | POA: Diagnosis not present

## 2019-02-28 DIAGNOSIS — F17210 Nicotine dependence, cigarettes, uncomplicated: Secondary | ICD-10-CM | POA: Diagnosis present

## 2019-02-28 DIAGNOSIS — E1122 Type 2 diabetes mellitus with diabetic chronic kidney disease: Secondary | ICD-10-CM

## 2019-02-28 DIAGNOSIS — E1165 Type 2 diabetes mellitus with hyperglycemia: Secondary | ICD-10-CM | POA: Diagnosis not present

## 2019-02-28 DIAGNOSIS — I252 Old myocardial infarction: Secondary | ICD-10-CM

## 2019-02-28 DIAGNOSIS — R296 Repeated falls: Secondary | ICD-10-CM | POA: Diagnosis present

## 2019-02-28 DIAGNOSIS — Z833 Family history of diabetes mellitus: Secondary | ICD-10-CM

## 2019-02-28 DIAGNOSIS — Z794 Long term (current) use of insulin: Secondary | ICD-10-CM | POA: Diagnosis not present

## 2019-02-28 DIAGNOSIS — Z96642 Presence of left artificial hip joint: Secondary | ICD-10-CM | POA: Diagnosis present

## 2019-02-28 DIAGNOSIS — S22089A Unspecified fracture of T11-T12 vertebra, initial encounter for closed fracture: Secondary | ICD-10-CM | POA: Diagnosis present

## 2019-02-28 DIAGNOSIS — N184 Chronic kidney disease, stage 4 (severe): Secondary | ICD-10-CM | POA: Diagnosis present

## 2019-02-28 DIAGNOSIS — E1151 Type 2 diabetes mellitus with diabetic peripheral angiopathy without gangrene: Secondary | ICD-10-CM | POA: Diagnosis present

## 2019-02-28 DIAGNOSIS — Y92003 Bedroom of unspecified non-institutional (private) residence as the place of occurrence of the external cause: Secondary | ICD-10-CM | POA: Diagnosis not present

## 2019-02-28 DIAGNOSIS — G8929 Other chronic pain: Secondary | ICD-10-CM | POA: Diagnosis present

## 2019-02-28 DIAGNOSIS — I2510 Atherosclerotic heart disease of native coronary artery without angina pectoris: Secondary | ICD-10-CM | POA: Diagnosis present

## 2019-02-28 DIAGNOSIS — E11649 Type 2 diabetes mellitus with hypoglycemia without coma: Secondary | ICD-10-CM | POA: Diagnosis not present

## 2019-02-28 DIAGNOSIS — E119 Type 2 diabetes mellitus without complications: Secondary | ICD-10-CM

## 2019-02-28 DIAGNOSIS — W19XXXA Unspecified fall, initial encounter: Secondary | ICD-10-CM

## 2019-02-28 DIAGNOSIS — E669 Obesity, unspecified: Secondary | ICD-10-CM | POA: Diagnosis present

## 2019-02-28 DIAGNOSIS — Z20828 Contact with and (suspected) exposure to other viral communicable diseases: Secondary | ICD-10-CM | POA: Diagnosis present

## 2019-02-28 DIAGNOSIS — J45909 Unspecified asthma, uncomplicated: Secondary | ICD-10-CM | POA: Diagnosis present

## 2019-02-28 DIAGNOSIS — S22079A Unspecified fracture of T9-T10 vertebra, initial encounter for closed fracture: Secondary | ICD-10-CM | POA: Diagnosis present

## 2019-02-28 DIAGNOSIS — R269 Unspecified abnormalities of gait and mobility: Secondary | ICD-10-CM | POA: Diagnosis present

## 2019-02-28 DIAGNOSIS — E1142 Type 2 diabetes mellitus with diabetic polyneuropathy: Secondary | ICD-10-CM | POA: Diagnosis present

## 2019-02-28 DIAGNOSIS — M549 Dorsalgia, unspecified: Secondary | ICD-10-CM | POA: Diagnosis present

## 2019-02-28 DIAGNOSIS — I5032 Chronic diastolic (congestive) heart failure: Secondary | ICD-10-CM | POA: Diagnosis present

## 2019-02-28 DIAGNOSIS — W06XXXA Fall from bed, initial encounter: Secondary | ICD-10-CM | POA: Diagnosis present

## 2019-02-28 DIAGNOSIS — Z825 Family history of asthma and other chronic lower respiratory diseases: Secondary | ICD-10-CM

## 2019-02-28 DIAGNOSIS — S22000A Wedge compression fracture of unspecified thoracic vertebra, initial encounter for closed fracture: Secondary | ICD-10-CM

## 2019-02-28 DIAGNOSIS — Z6833 Body mass index (BMI) 33.0-33.9, adult: Secondary | ICD-10-CM | POA: Diagnosis not present

## 2019-02-28 DIAGNOSIS — I10 Essential (primary) hypertension: Secondary | ICD-10-CM | POA: Diagnosis present

## 2019-02-28 DIAGNOSIS — N179 Acute kidney failure, unspecified: Secondary | ICD-10-CM | POA: Diagnosis present

## 2019-02-28 DIAGNOSIS — R55 Syncope and collapse: Secondary | ICD-10-CM

## 2019-02-28 DIAGNOSIS — I130 Hypertensive heart and chronic kidney disease with heart failure and stage 1 through stage 4 chronic kidney disease, or unspecified chronic kidney disease: Secondary | ICD-10-CM | POA: Diagnosis present

## 2019-02-28 DIAGNOSIS — Z79899 Other long term (current) drug therapy: Secondary | ICD-10-CM

## 2019-02-28 DIAGNOSIS — R262 Difficulty in walking, not elsewhere classified: Secondary | ICD-10-CM | POA: Diagnosis present

## 2019-02-28 DIAGNOSIS — E785 Hyperlipidemia, unspecified: Secondary | ICD-10-CM | POA: Diagnosis present

## 2019-02-28 DIAGNOSIS — Z7982 Long term (current) use of aspirin: Secondary | ICD-10-CM

## 2019-02-28 DIAGNOSIS — Z7902 Long term (current) use of antithrombotics/antiplatelets: Secondary | ICD-10-CM

## 2019-02-28 DIAGNOSIS — M109 Gout, unspecified: Secondary | ICD-10-CM

## 2019-02-28 LAB — COMPREHENSIVE METABOLIC PANEL
ALT: 13 U/L (ref 0–44)
AST: 17 U/L (ref 15–41)
Albumin: 3.5 g/dL (ref 3.5–5.0)
Alkaline Phosphatase: 89 U/L (ref 38–126)
Anion gap: 6 (ref 5–15)
BUN: 53 mg/dL — ABNORMAL HIGH (ref 8–23)
CO2: 20 mmol/L — ABNORMAL LOW (ref 22–32)
Calcium: 8.8 mg/dL — ABNORMAL LOW (ref 8.9–10.3)
Chloride: 111 mmol/L (ref 98–111)
Creatinine, Ser: 3.35 mg/dL — ABNORMAL HIGH (ref 0.61–1.24)
GFR calc Af Amer: 19 mL/min — ABNORMAL LOW (ref >60)
GFR calc non Af Amer: 17 mL/min — ABNORMAL LOW (ref >60)
Glucose, Bld: 245 mg/dL — ABNORMAL HIGH (ref 70–99)
Potassium: 4.5 mmol/L (ref 3.5–5.1)
Sodium: 137 mmol/L (ref 135–145)
Total Bilirubin: 0.9 mg/dL (ref 0.3–1.2)
Total Protein: 7.2 g/dL (ref 6.5–8.1)

## 2019-02-28 LAB — CBC WITH DIFFERENTIAL/PLATELET
Abs Immature Granulocytes: 0.01 10*3/uL (ref 0.00–0.07)
Basophils Absolute: 0 10*3/uL (ref 0.0–0.1)
Basophils Relative: 1 %
Eosinophils Absolute: 0.2 10*3/uL (ref 0.0–0.5)
Eosinophils Relative: 4 %
HCT: 32.9 % — ABNORMAL LOW (ref 39.0–52.0)
Hemoglobin: 10.4 g/dL — ABNORMAL LOW (ref 13.0–17.0)
Immature Granulocytes: 0 %
Lymphocytes Relative: 22 %
Lymphs Abs: 1 10*3/uL (ref 0.7–4.0)
MCH: 27.9 pg (ref 26.0–34.0)
MCHC: 31.6 g/dL (ref 30.0–36.0)
MCV: 88.2 fL (ref 80.0–100.0)
Monocytes Absolute: 0.5 10*3/uL (ref 0.1–1.0)
Monocytes Relative: 11 %
Neutro Abs: 2.9 10*3/uL (ref 1.7–7.7)
Neutrophils Relative %: 62 %
Platelets: 216 10*3/uL (ref 150–400)
RBC: 3.73 MIL/uL — ABNORMAL LOW (ref 4.22–5.81)
RDW: 14.8 % (ref 11.5–15.5)
WBC: 4.6 10*3/uL (ref 4.0–10.5)
nRBC: 0 % (ref 0.0–0.2)

## 2019-02-28 LAB — URINALYSIS, ROUTINE W REFLEX MICROSCOPIC
Bacteria, UA: NONE SEEN
Bilirubin Urine: NEGATIVE
Glucose, UA: 50 mg/dL — AB
Hgb urine dipstick: NEGATIVE
Ketones, ur: NEGATIVE mg/dL
Leukocytes,Ua: NEGATIVE
Nitrite: NEGATIVE
Protein, ur: 100 mg/dL — AB
Specific Gravity, Urine: 1.012 (ref 1.005–1.030)
pH: 5 (ref 5.0–8.0)

## 2019-02-28 LAB — SARS CORONAVIRUS 2 (TAT 6-24 HRS): SARS Coronavirus 2: NEGATIVE

## 2019-02-28 LAB — HEMOGLOBIN A1C
Hgb A1c MFr Bld: 9 % — ABNORMAL HIGH (ref 4.8–5.6)
Mean Plasma Glucose: 211.6 mg/dL

## 2019-02-28 LAB — TROPONIN I (HIGH SENSITIVITY)
Troponin I (High Sensitivity): 17 ng/L (ref <18)
Troponin I (High Sensitivity): 17 ng/L (ref <18)

## 2019-02-28 LAB — CBG MONITORING, ED: Glucose-Capillary: 232 mg/dL — ABNORMAL HIGH (ref 70–99)

## 2019-02-28 LAB — GLUCOSE, CAPILLARY: Glucose-Capillary: 152 mg/dL — ABNORMAL HIGH (ref 70–99)

## 2019-02-28 LAB — BRAIN NATRIURETIC PEPTIDE: B Natriuretic Peptide: 542.6 pg/mL — ABNORMAL HIGH (ref 0.0–100.0)

## 2019-02-28 LAB — MAGNESIUM: Magnesium: 1.8 mg/dL (ref 1.7–2.4)

## 2019-02-28 MED ORDER — METOPROLOL SUCCINATE ER 100 MG PO TB24
100.0000 mg | ORAL_TABLET | Freq: Every day | ORAL | Status: DC
  Administered 2019-02-28 – 2019-03-02 (×3): 100 mg via ORAL
  Filled 2019-02-28 (×3): qty 1

## 2019-02-28 MED ORDER — DOCUSATE SODIUM 100 MG PO CAPS
100.0000 mg | ORAL_CAPSULE | Freq: Two times a day (BID) | ORAL | Status: DC
  Administered 2019-02-28 – 2019-03-02 (×4): 100 mg via ORAL
  Filled 2019-02-28 (×5): qty 1

## 2019-02-28 MED ORDER — DOXAZOSIN MESYLATE 4 MG PO TABS
4.0000 mg | ORAL_TABLET | Freq: Every day | ORAL | Status: DC
  Administered 2019-02-28 – 2019-03-02 (×3): 4 mg via ORAL
  Filled 2019-02-28 (×3): qty 1

## 2019-02-28 MED ORDER — ONDANSETRON HCL 4 MG/2ML IJ SOLN: 4.0000 mg | Freq: Four times a day (QID) | INTRAMUSCULAR | Status: DC | PRN

## 2019-02-28 MED ORDER — FLUTICASONE PROPIONATE 50 MCG/ACT NA SUSP
2.0000 | Freq: Every day | NASAL | Status: DC | PRN
  Filled 2019-02-28: qty 16

## 2019-02-28 MED ORDER — HEPARIN SODIUM (PORCINE) 5000 UNIT/ML IJ SOLN
5000.0000 [IU] | Freq: Three times a day (TID) | INTRAMUSCULAR | Status: DC
  Administered 2019-02-28 – 2019-03-02 (×5): 5000 [IU] via SUBCUTANEOUS
  Filled 2019-02-28 (×5): qty 1

## 2019-02-28 MED ORDER — BISACODYL 10 MG RE SUPP: 10.0000 mg | Freq: Every day | RECTAL | Status: DC | PRN

## 2019-02-28 MED ORDER — SIMVASTATIN 20 MG PO TABS
20.0000 mg | ORAL_TABLET | Freq: Every evening | ORAL | Status: DC
  Administered 2019-02-28 – 2019-03-01 (×2): 20 mg via ORAL
  Filled 2019-02-28 (×2): qty 1

## 2019-02-28 MED ORDER — INSULIN GLARGINE 100 UNIT/ML SOLOSTAR PEN: 45.0000 [IU] | PEN_INJECTOR | Freq: Every day | SUBCUTANEOUS | Status: DC

## 2019-02-28 MED ORDER — INSULIN ASPART 100 UNIT/ML ~~LOC~~ SOLN
0.0000 [IU] | Freq: Every day | SUBCUTANEOUS | Status: DC
  Filled 2019-02-28: qty 0.05

## 2019-02-28 MED ORDER — INSULIN ASPART 100 UNIT/ML ~~LOC~~ SOLN
0.0000 [IU] | Freq: Three times a day (TID) | SUBCUTANEOUS | Status: DC
  Administered 2019-03-02: 1 [IU] via SUBCUTANEOUS
  Filled 2019-02-28: qty 0.06

## 2019-02-28 MED ORDER — HYDROMORPHONE HCL 1 MG/ML IJ SOLN: 0.5000 mg | INTRAMUSCULAR | Status: DC | PRN

## 2019-02-28 MED ORDER — ACETAMINOPHEN 325 MG PO TABS: 650.0000 mg | ORAL_TABLET | Freq: Four times a day (QID) | ORAL | Status: DC | PRN

## 2019-02-28 MED ORDER — ONDANSETRON HCL 4 MG/2ML IJ SOLN
4.0000 mg | Freq: Once | INTRAMUSCULAR | Status: AC
  Administered 2019-02-28: 4 mg via INTRAVENOUS
  Filled 2019-02-28: qty 2

## 2019-02-28 MED ORDER — OXYCODONE HCL 5 MG PO TABS: 5.0000 mg | ORAL_TABLET | ORAL | Status: DC | PRN

## 2019-02-28 MED ORDER — ONDANSETRON HCL 4 MG PO TABS: 4.0000 mg | ORAL_TABLET | Freq: Four times a day (QID) | ORAL | Status: DC | PRN

## 2019-02-28 MED ORDER — HYDROMORPHONE HCL 1 MG/ML IJ SOLN
0.5000 mg | Freq: Once | INTRAMUSCULAR | Status: AC
  Administered 2019-02-28: 0.5 mg via INTRAVENOUS
  Filled 2019-02-28: qty 1

## 2019-02-28 MED ORDER — ACETAMINOPHEN 650 MG RE SUPP: 650.0000 mg | Freq: Four times a day (QID) | RECTAL | Status: DC | PRN

## 2019-02-28 MED ORDER — SODIUM CHLORIDE 0.9 % IV BOLUS: 1000.0000 mL | Freq: Once | INTRAVENOUS | Status: DC

## 2019-02-28 MED ORDER — LORATADINE 10 MG PO TABS
10.0000 mg | ORAL_TABLET | Freq: Every day | ORAL | Status: DC
  Administered 2019-02-28 – 2019-03-02 (×3): 10 mg via ORAL
  Filled 2019-02-28 (×3): qty 1

## 2019-02-28 MED ORDER — INSULIN ASPART 100 UNIT/ML ~~LOC~~ SOLN: 10.0000 [IU] | Freq: Three times a day (TID) | SUBCUTANEOUS | Status: DC

## 2019-02-28 MED ORDER — FENTANYL CITRATE (PF) 100 MCG/2ML IJ SOLN
50.0000 ug | Freq: Once | INTRAMUSCULAR | Status: AC
  Administered 2019-02-28: 50 ug via INTRAVENOUS
  Filled 2019-02-28: qty 2

## 2019-02-28 MED ORDER — POLYETHYLENE GLYCOL 3350 17 G PO PACK: 17.0000 g | PACK | Freq: Every day | ORAL | Status: DC | PRN

## 2019-02-28 MED ORDER — TIZANIDINE HCL 4 MG PO TABS
4.0000 mg | ORAL_TABLET | Freq: Two times a day (BID) | ORAL | Status: DC | PRN
  Administered 2019-03-02: 4 mg via ORAL
  Filled 2019-02-28: qty 1

## 2019-02-28 MED ORDER — PANTOPRAZOLE SODIUM 40 MG PO TBEC
40.0000 mg | DELAYED_RELEASE_TABLET | Freq: Every day | ORAL | Status: DC
  Administered 2019-02-28 – 2019-03-02 (×3): 40 mg via ORAL
  Filled 2019-02-28 (×3): qty 1

## 2019-02-28 MED ORDER — PREGABALIN 75 MG PO CAPS: 150.0000 mg | ORAL_CAPSULE | Freq: Three times a day (TID) | ORAL | Status: DC

## 2019-02-28 MED ORDER — COLCHICINE 0.6 MG PO CAPS: 0.6000 mg | ORAL_CAPSULE | Freq: Every day | ORAL | Status: DC | PRN

## 2019-02-28 MED ORDER — NICOTINE 21 MG/24HR TD PT24
21.0000 mg | MEDICATED_PATCH | TRANSDERMAL | Status: DC
  Administered 2019-02-28 – 2019-03-01 (×2): 21 mg via TRANSDERMAL
  Filled 2019-02-28 (×2): qty 1

## 2019-02-28 MED ORDER — HYDROCODONE-ACETAMINOPHEN 5-325 MG PO TABS
1.0000 | ORAL_TABLET | ORAL | Status: DC | PRN
  Administered 2019-02-28 – 2019-03-02 (×5): 2 via ORAL
  Filled 2019-02-28 (×5): qty 2

## 2019-02-28 MED ORDER — SODIUM CHLORIDE 0.9 % IV SOLN
INTRAVENOUS | Status: AC
  Administered 2019-02-28 – 2019-03-01 (×2): via INTRAVENOUS

## 2019-02-28 MED ORDER — HYDRALAZINE HCL 50 MG PO TABS
50.0000 mg | ORAL_TABLET | Freq: Two times a day (BID) | ORAL | Status: DC
  Administered 2019-02-28 – 2019-03-02 (×4): 50 mg via ORAL
  Filled 2019-02-28 (×4): qty 1

## 2019-02-28 MED ORDER — COLCHICINE 0.6 MG PO TABS: 0.6000 mg | ORAL_TABLET | Freq: Every day | ORAL | Status: DC | PRN

## 2019-02-28 MED ORDER — INSULIN GLARGINE 100 UNIT/ML ~~LOC~~ SOLN
45.0000 [IU] | Freq: Every day | SUBCUTANEOUS | Status: DC
  Administered 2019-02-28: 45 [IU] via SUBCUTANEOUS
  Filled 2019-02-28 (×2): qty 0.45

## 2019-02-28 MED ORDER — PREGABALIN 75 MG PO CAPS
150.0000 mg | ORAL_CAPSULE | Freq: Three times a day (TID) | ORAL | Status: DC
  Administered 2019-02-28: 150 mg via ORAL
  Filled 2019-02-28: qty 2

## 2019-02-28 MED ORDER — AMLODIPINE BESYLATE 10 MG PO TABS
10.0000 mg | ORAL_TABLET | Freq: Every day | ORAL | Status: DC
  Administered 2019-02-28 – 2019-03-02 (×3): 10 mg via ORAL
  Filled 2019-02-28 (×3): qty 1

## 2019-02-28 NOTE — ED Notes (Signed)
RN notified of CBG. 

## 2019-02-28 NOTE — ED Provider Notes (Signed)
[facility] DEPT Provider Note   CSN: [id number] Arrival date & time: 02/28/19  1043     History   Chief Complaint Chief Complaint  Patient presents with   Back Pain    HPI Zachary Hoffman is a 78 y.o. male the past medical history of diabetes, chronic tobacco abuse, coronary artery disease, history of CHF, stage II kidney disease, previous MI and peripheral artery disease.  Patient presents via EMS today for syncope and back pain.  History is gathered from the patient, review of EMR and phone call with patient's wife.  Patient reports that he has had at least 2 episodes of syncope this past week.  He states that one time he knew that it was coming on because he felt lightheaded and another time he did not have any prodrome prior to losing consciousness.  On Saturday, the patient's wife reports, that he was taking out the trash when lost consciousness  He apparently got back up and went out to take the trash out,  Was able to come back upstairs, but began complaining of severe pain in the middle of his back.  She states that he has not been able to do much since then.  He was able to get up and use the bathroom yesterday however this morning she found him on the floor next to the bed trying to get up to use the bathroom.  She thinks he may have lost consciousness again.  Patient complains of severe middle back pain, pain with any movement.  He denies any weakness in his lower extremities or upper extremities.  He is unsure if he has hit his head or lost consciousness this week.  The patient is very somnolent.  Wife reports no previous history of sleep apnea.  She denies that he has been confused.  He is on Plavix. Patient denies fevers chills abdominal pain nausea or vomiting.     HPI  Past Medical History:  Diagnosis Date   Asthma    CHF (congestive heart failure) ([place])    Echo 07/2006 with normal LV fxn, and diastolic dysfxn   Coronary artery disease      Diabetes mellitus    On insulin    Diverticulosis 05/2009   Noted on screening colo    Dyslipidemia    Eczema    Headache(784.0)    Hypertension    Kidney disease, chronic, stage II (GFR 60-89 ml/min)    MI (myocardial infarction) ([place])    S/p stenting per patient. Last stress 04/2007 negative    Neuropathy    Pancreatitis    P/H   Peripheral arterial disease ([place])    S/p left femoro-femoral bypass. LE cath by Dr. Einar Gip 09/2007 without significant stenosis   Tobacco abuse     Patient Active Problem List   Diagnosis Date Noted   CKD (chronic kidney disease), stage IV ([place]) 02/28/2019   Intractable back pain 02/28/2019   T10 vertebral fracture ([place]) 02/28/2019   T11 vertebral fracture ([place]) 02/28/2019   Ambulatory dysfunction 02/28/2019   AKI (acute kidney injury) ([place]) 02/28/2019   Tobacco abuse 06/06/2014   OSA on CPAP 02/22/2013   DM2 (diabetes mellitus, type 2) ([place]) 02/22/2013   Hyperlipidemia with target LDL less than 70 02/22/2013   PVD (peripheral vascular disease)- Hx F-F in [place], last dopplers [place] 6/13 10/25/2012   CAD (coronary artery disease)- Hx of LAD stent in '04- low risk Myoview 6/12 10/25/2012   Edema- Lt leg, new since  May 10/25/2012   Unspecified sleep apnea by history 10/25/2012   Obesity 10/25/2012   Situational stress- going through his 3d seperation (same wife) 10/25/2012   Renal insufficiency 06/10/2011   Hypertension     Past Surgical History:  Procedure Laterality Date   ADENOIDECTOMY     CORONARY ANGIOPLASTY WITH STENT PLACEMENT  2004   Mid LAD, 95% first obtuse marginal    DOPPLER ECHOCARDIOGRAPHY  07/28/2006   Mod. LVH,EF =>55%,LA mildly dilated,mitral annular ca+,AOV mildly sclerotic,trace AI   FEMORAL-FEMORAL BYPASS GRAFT     In [place], on the left. Patent on cath 09/2007   LOWER EXTREMITY ANGIOGRAM  09/20/2007   no significant stenosis   Nuclear Stress Test  09/17/2010   No ischemia   REPLACEMENT  TOTAL HIP W/  RESURFACING IMPLANTS     Left   TONSILLECTOMY          Home Medications    Prior to Admission medications   Medication Sig Start Date End Date Taking? Authorizing Provider  amLODipine (NORVASC) 10 MG tablet Take 10 mg by mouth daily. 02/07/19  Yes [provider]  aspirin EC 81 MG tablet Take 81 mg by mouth daily.   Yes [provider]  cetirizine (ZYRTEC) 10 MG tablet Take 10 mg by mouth daily as needed for allergies.  06/13/15  Yes [provider]  clopidogrel (PLAVIX) 75 MG tablet Take 75 mg by mouth daily.   Yes [provider]  doxazosin (CARDURA) 4 MG tablet Take 4 mg by mouth daily.  02/15/18  Yes [provider]  fluticasone (FLONASE) 50 MCG/ACT nasal spray Place 2 sprays into both nostrils daily as needed for allergies.  05/18/15  Yes [provider]  furosemide (LASIX) 40 MG tablet Take 40 mg by mouth.   Yes [provider]  hydrALAZINE (APRESOLINE) 50 MG tablet Take 50 mg by mouth 2 (two) times daily.  11/05/18  Yes [provider]  insulin glulisine (APIDRA) 100 UNIT/ML injection Inject 10 Units into the skin 3 (three) times daily with meals. Reported on 06/29/2015   Yes [provider]  LANTUS SOLOSTAR 100 UNIT/ML Solostar Pen Inject 45 Units into the skin daily.  02/07/15  Yes [provider]  losartan (COZAAR) 50 MG tablet Take 50 mg by mouth daily.  10/29/18  Yes [provider]  metoprolol succinate (TOPROL-XL) 100 MG 24 hr tablet Take 100 mg by mouth daily. Take with or immediately following a meal.   Yes [provider]  MITIGARE 0.6 MG CAPS Take 0.6 mg by mouth daily as needed (gout attacks).   Yes [provider]  pantoprazole (PROTONIX) 40 MG tablet Take 40 mg by mouth daily. Reported on 06/29/2015   Yes [provider]  pregabalin (LYRICA) 150 MG capsule Take 150 mg by mouth 3 (three) times daily.  04/28/18  Yes [provider]   simvastatin (ZOCOR) 20 MG tablet Take 20 mg by mouth every evening.   Yes [provider]  tiZANidine (ZANAFLEX) 4 MG tablet Take 4 mg by mouth 2 (two) times daily as needed for muscle spasms.  02/15/18  Yes [provider]  PRODIGY NO CODING BLOOD GLUC test strip U TID 06/15/18   [provider]    Family History Family History  Problem Relation Age of Onset   Stroke Mother    Diabetes Father    Cancer Father    Asthma Son    Multiple sclerosis Daughter    Eczema  Daughter    Allergic rhinitis Neg Hx    Angioedema Neg Hx    Immunodeficiency Neg Hx    Urticaria Neg Hx     Social History Social History   Tobacco Use   Smoking status: Current Some Day Smoker    Packs/day: 0.50    Years: 50.00    Pack years: 25.00    Types: Cigarettes   Smokeless tobacco: Never Used  Substance Use Topics   Alcohol use: No   Drug use: No     Allergies   Patient has no known allergies.   Review of Systems Review of Systems  Ten systems reviewed and are negative for acute change, except as noted in the HPI.   Physical Exam Updated Vital Signs BP (!) 148/87 (BP Location: Right Arm)    Pulse 64    Temp 98.7 F (37.1 C) (Oral)    Resp 18    Ht 5\' 7"  (1.702 m)    Wt 94.2 kg    SpO2 94%    BMI 32.53 kg/m   Physical Exam Vitals signs and nursing note reviewed.  Constitutional:      General: He is not in acute distress.    Appearance: He is well-developed. He is not diaphoretic.  HENT:     Head: Normocephalic and atraumatic.  Eyes:     General: No scleral icterus.    Conjunctiva/sclera: Conjunctivae normal.  Neck:     Musculoskeletal: Normal range of motion and neck supple.  Cardiovascular:     Rate and Rhythm: Normal rate and regular rhythm.     Pulses:          Dorsalis pedis pulses are 1+ on the right side and 1+ on the left side.       Posterior tibial pulses are 1+ on the right side and 1+ on the left side.     Heart sounds: Normal  heart sounds.  Pulmonary:     Effort: Pulmonary effort is normal. No respiratory distress.     Breath sounds: Normal breath sounds.  Abdominal:     Palpations: Abdomen is soft.     Tenderness: There is no abdominal tenderness.  Musculoskeletal:     Thoracic back: He exhibits bony tenderness and spasm.       Back:     Right lower leg: No edema.     Left lower leg: No edema.     Comments: Patient log rolled with precautions Midline tenderness in the midthoracic region Normal strength with dorsi/plantarflexion of the ankle. Unable to raise legs at the hip due to pain in the back    Skin:    General: Skin is warm and dry.  Neurological:     Mental Status: He is alert.     Deep Tendon Reflexes:     Reflex Scores:      Patellar reflexes are 1+ on the right side and 1+ on the left side. Psychiatric:        Behavior: Behavior normal.      ED Treatments / Results  Labs (all labs ordered are listed, but only abnormal results are displayed) Labs Reviewed  CBC WITH DIFFERENTIAL/PLATELET - Abnormal; Notable for the following components:      Result Value   RBC 3.73 (*)    Hemoglobin 10.4 (*)    HCT 32.9 (*)    All other components within normal limits  COMPREHENSIVE METABOLIC PANEL - Abnormal; Notable for the following components:   CO2 20 (*)  Glucose, Bld 245 (*)    BUN 53 (*)    Creatinine, Ser 3.35 (*)    Calcium 8.8 (*)    GFR calc non Af Amer 17 (*)    GFR calc Af Amer 19 (*)    All other components within normal limits  URINALYSIS, ROUTINE W REFLEX MICROSCOPIC - Abnormal; Notable for the following components:   Glucose, UA 50 (*)    Protein, ur 100 (*)    All other components within normal limits  BRAIN NATRIURETIC PEPTIDE - Abnormal; Notable for the following components:   B Natriuretic Peptide 542.6 (*)    All other components within normal limits  HEMOGLOBIN A1C - Abnormal; Notable for the following components:   Hgb A1c MFr Bld 9.0 (*)    All other  components within normal limits  GLUCOSE, CAPILLARY - Abnormal; Notable for the following components:   Glucose-Capillary 152 (*)    All other components within normal limits  CBG MONITORING, ED - Abnormal; Notable for the following components:   Glucose-Capillary 232 (*)    All other components within normal limits  SARS CORONAVIRUS 2 (TAT 6-24 HRS)  MAGNESIUM  CBC  PROTIME-INR  TROPONIN I (HIGH SENSITIVITY)  TROPONIN I (HIGH SENSITIVITY)    EKG EKG Interpretation  Date/Time:  Monday February 28 2019 12:05:47 EST Ventricular Rate:  61 PR Interval:    QRS Duration: 89 QT Interval:  386 QTC Calculation: 389 R Axis:   78 Text Interpretation: Sinus rhythm Ventricular trigeminy Confirmed by Lennice Sites [phone]) on 02/28/2019 12:11:28 PM   Radiology Dg Chest 2 View  Result Date: 02/28/2019 CLINICAL DATA:  Fall. EXAM: CHEST - 2 VIEW COMPARISON:  Chest radiograph 12/15/2013 FINDINGS: Heart size within normal limits.  Aortic atherosclerosis. Shallow inspiration radiograph with crowding of the central bronchovascular markings. Suspected mild pulmonary vascular congestion with a small amount of fluid within the minor fissure. No airspace consolidation. No displaced fracture is identified. Thoracic spine degenerative change with multilevel bridging ventral osteophytes. IMPRESSION: Shallow inspiration radiograph. Suspected mild pulmonary vascular congestion with a small amount of fluid within the minor fissure. Aortic atherosclerosis. Electronically Signed   By: Kellie Simmering DO   On: 02/28/2019 14:04   Dg Thoracic Spine 2 View  Result Date: 02/28/2019 CLINICAL DATA:  Multiple falls EXAM: THORACIC SPINE 2 VIEWS COMPARISON:  None. FINDINGS: There is no evidence of thoracic spine fracture. Alignment is normal. Multilevel degenerative changes are present. No other significant bone abnormalities are identified. IMPRESSION: No compression deformity. Electronically Signed   By: Macy Mis  M.D.   On: 02/28/2019 14:05   Dg Lumbar Spine Complete  Result Date: 02/28/2019 CLINICAL DATA:  Multiple falls EXAM: LUMBAR SPINE - COMPLETE 4+ VIEW COMPARISON:  November 01, 2018 FINDINGS: Stable vertebral body heights and alignment. No acute fracture identified. Multilevel degenerative changes with disc height loss, endplate osteophytes, and facet hypertrophy again noted. Partially imaged right total hip arthroplasty. IMPRESSION: No new finding.  Multilevel degenerative changes. Electronically Signed   By: Macy Mis M.D.   On: 02/28/2019 14:03   Ct Head Wo Contrast  Result Date: 02/28/2019 CLINICAL DATA:  78 year old male with history of recurrent syncope. Fall today. Back pain. EXAM: CT HEAD WITHOUT CONTRAST CT CERVICAL SPINE WITHOUT CONTRAST TECHNIQUE: Multidetector CT imaging of the head and cervical spine was performed following the standard protocol without intravenous contrast. Multiplanar CT image reconstructions of the cervical spine were also generated. COMPARISON:  None. FINDINGS: CT HEAD FINDINGS Brain: Mild cerebral atrophy.  Patchy and confluent areas of decreased attenuation are noted throughout the deep and periventricular white matter of the cerebral hemispheres bilaterally, compatible with chronic microvascular ischemic disease. Well-defined areas of low attenuation in the left cerebellar hemisphere related to remote left cerebellar infarct. No evidence of acute infarction, hemorrhage, hydrocephalus, extra-axial collection or mass lesion/mass effect. Vascular: No hyperdense vessel or unexpected calcification. Skull: Normal. Negative for fracture or focal lesion. Sinuses/Orbits: No acute finding. Other: None. CT CERVICAL SPINE FINDINGS Alignment: Mild reversal of normal cervical lordosis centered at the level of C4, likely chronic and related to degenerative disease. Skull base and vertebrae: No acute fracture. No primary bone lesion or focal pathologic process. Soft tissues and spinal  canal: No prevertebral fluid or swelling. No visible canal hematoma. Disc levels: Severe multilevel degenerative disc disease, most pronounced at C3-C4, C4-C5, C5-C6 and C6-C7. Severe multilevel facet arthropathy. Upper chest: Unremarkable. Other: There are no aggressive appearing lytic or blastic lesions noted in the visualized portions of the skeleton. IMPRESSION: 1. No evidence of significant acute traumatic injury to the skull, brain or cervical spine. 2. 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 3. 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 Electronically Signed   By: Vinnie Langton M.D.   On: 02/28/2019 14:18   Ct Cervical Spine Wo Contrast  Result Date: 02/28/2019 CLINICAL DATA:  78 year old male with history of recurrent syncope. Fall today. Back pain. EXAM: CT HEAD WITHOUT CONTRAST CT CERVICAL SPINE WITHOUT CONTRAST TECHNIQUE: Multidetector CT imaging of the head and cervical spine was performed following the standard protocol without intravenous contrast. Multiplanar CT image reconstructions of the cervical spine were also generated. COMPARISON:  None. FINDINGS: CT HEAD FINDINGS Brain: Mild cerebral atrophy. Patchy and confluent areas of decreased attenuation are noted throughout the deep and periventricular white matter of the cerebral hemispheres bilaterally, compatible with chronic microvascular ischemic disease. Well-defined areas of low attenuation in the left cerebellar hemisphere related to remote left cerebellar infarct. No evidence of acute infarction, hemorrhage, hydrocephalus, extra-axial collection or mass lesion/mass effect. Vascular: No hyperdense vessel or unexpected calcification. Skull: Normal. Negative for fracture or focal lesion. Sinuses/Orbits: No acute finding. Other: None. CT CERVICAL SPINE FINDINGS Alignment: Mild reversal of  normal cervical lordosis centered at the level of C4, likely chronic and related to degenerative disease. Skull base and vertebrae: No acute fracture. No primary bone lesion or focal pathologic process. Soft tissues and spinal canal: No prevertebral fluid or swelling. No visible canal hematoma. Disc levels: Severe multilevel degenerative disc disease, most pronounced at C3-C4, C4-C5, C5-C6 and C6-C7. Severe multilevel facet arthropathy. Upper chest: Unremarkable. Other: There are no aggressive appearing lytic or blastic lesions noted in the visualized portions of the skeleton. IMPRESSION: 1. No evidence of significant acute traumatic injury to the skull, brain or cervical spine. 2. 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 3. 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 Electronically Signed   By: Vinnie Langton M.D.   On: 02/28/2019 14:18   Ct Thoracic Spine Wo Contrast  Result Date: 02/28/2019 CLINICAL DATA:  Severe back pain and back brace gin secondary to a fall today. Multiple recent falls. EXAM: CT THORACIC SPINE WITHOUT CONTRAST TECHNIQUE: Multidetector CT images of the thoracic were obtained using the standard protocol without intravenous contrast. COMPARISON:  Radiographs dated 02/28/2019 FINDINGS: Alignment: Normal. Vertebrae: There is an acute oblique fracture through the T10 and T11 vertebral bodies. The fracture involves primarily the T10 vertebral body that extends into the right anterior superior aspect of the superior endplate T11 fracturing through anterior and right lateral osteophytes that have previously fused the T10 and T11 levels. The fracture does not appear to extend into the pedicles or posterior elements at either level there is approximately 3 mm of distraction at the anterior aspect of vertebral bodies. No visible hemorrhage into  the spinal canal at the site of the fracture or elsewhere in the thoracic spine. The remainder of the thoracic spine is intact. The spine appears to be fused posteriorly from T2 through T7 and spine is fused anteriorly from at least T4 through T9-10 and was previously fused at T10-11 prior to this fracture the T11-12 level does not appear to be fused. There is multilevel foraminal stenosis in the mid upper thoracic spine due to the fusions. No spinal stenosis. Paraspinal and other soft tissues: Aortic atherosclerosis. No evidence of a paraspinal hematoma at the site of the fracture. Disc levels: Diffuse disc space narrowing with fusion of much of the thoracic spine as described above. No discrete disc protrusions. IMPRESSION: 1. Acute oblique fracture of the T10 and T11 vertebral bodies as described above. The fracture does not appear to extend into the pedicles or posterior elements at either level. The majority of the thoracic spine is fused as described above. 2. The remainder of the thoracic spine is intact. 3. No evidence of epidural hematoma or other discrete severe encroachment upon the spinal canal. 4. Aortic Atherosclerosis (ICD10-I70.0). Electronically Signed   By: Lorriane Shire M.D.   On: 02/28/2019 16:07    Procedures Procedures (including critical care time)  Medications Ordered in ED Medications  amLODipine (NORVASC) tablet 10 mg (10 mg Oral Given 02/28/19 2002)  doxazosin (CARDURA) tablet 4 mg (4 mg Oral Given 02/28/19 2001)  hydrALAZINE (APRESOLINE) tablet 50 mg (50 mg Oral Given 02/28/19 2223)  metoprolol succinate (TOPROL-XL) 24 hr tablet 100 mg (100 mg Oral Given 02/28/19 2223)  simvastatin (ZOCOR) tablet 20 mg (20 mg Oral Given 02/28/19 2224)  insulin aspart (novoLOG) injection 10 Units (has no administration in time range)  pantoprazole (PROTONIX) EC tablet 40 mg (40 mg Oral Given 02/28/19 2224)  tiZANidine (ZANAFLEX) tablet 4 mg (has no administration in time range)  loratadine  (CLARITIN) tablet 10 mg (10 mg Oral Given 02/28/19 2224)  fluticasone (FLONASE) 50 MCG/ACT nasal spray 2 spray (has no administration in time range)  0.9 %  sodium chloride infusion ( Intravenous New Bag/Given 02/28/19 2000)  acetaminophen (TYLENOL) tablet 650 mg (has no administration in time range)    Or  acetaminophen (TYLENOL) suppository 650 mg (has no administration in time range)  HYDROcodone-acetaminophen (NORCO/VICODIN) 5-325 MG per tablet 1-2 tablet (2 tablets Oral Given 02/28/19 2014)  docusate sodium (COLACE) capsule 100 mg (100 mg Oral Given 02/28/19 2223)  polyethylene glycol (MIRALAX / GLYCOLAX) packet 17 g (has no administration in time range)  bisacodyl (DULCOLAX) suppository 10 mg (has no administration in time range)  ondansetron (ZOFRAN) tablet 4 mg (has no administration in time range)    Or  ondansetron (ZOFRAN) injection 4 mg (has no administration in time range)  HYDROmorphone (DILAUDID) injection 0.5 mg (has no administration in time range)  oxyCODONE (Oxy IR/ROXICODONE) immediate release tablet 5 mg (has no administration in time range)  nicotine (NICODERM CQ - dosed in  mg/24 hours) patch 21 mg (21 mg Transdermal Patch Applied 02/28/19 2001)  heparin injection 5,000 Units (5,000 Units Subcutaneous Given 02/28/19 2223)  insulin aspart (novoLOG) injection 0-6 Units (has no administration in time range)  insulin aspart (novoLOG) injection 0-5 Units (0 Units Subcutaneous Not Given 02/28/19 2222)  colchicine tablet 0.6 mg (has no administration in time range)  insulin glargine (LANTUS) injection 45 Units (45 Units Subcutaneous Given 02/28/19 2222)  pregabalin (LYRICA) capsule 150 mg (150 mg Oral Given 02/28/19 2223)  fentaNYL (SUBLIMAZE) injection 50 mcg (50 mcg Intravenous Given 02/28/19 1222)  ondansetron (ZOFRAN) injection 4 mg (4 mg Intravenous Given 02/28/19 1222)  HYDROmorphone (DILAUDID) injection 0.5 mg (0.5 mg Intravenous Given 02/28/19 1627)  ondansetron  (ZOFRAN) injection 4 mg (4 mg Intravenous Given 02/28/19 1627)     Initial Impression / Assessment and Plan / ED Course  I have reviewed the triage vital signs and the nursing notes.  Pertinent labs & imaging results that were available during my care of the patient were reviewed by me and considered in my medical decision making (see chart for details).  Clinical Course as of Feb 27 2300  Mon Feb 28, 2019  1314 Creatinine(!): 3.35 [AH]  1314 Calcium(!): 8.8 [AH]  1314 GFR, Est Non African American(!): 17 [AH]  1314 GFR, Est African American(!): 19 [AH]  1346 B Natriuretic Peptide(!): 542.6 [AH]    Clinical Course User Index [AH] Margarita Mail, PA-C       CC: Syncope and back pain VS: BP (!) 148/87 (BP Location: Right Arm)    Pulse 64    Temp 98.7 F (37.1 C) (Oral)    Resp 18    Ht 5\' 7"  (1.702 m)    Wt 94.2 kg    SpO2 94%    BMI 32.53 kg/m  [id number] is gathered by the patient, EMR and wife by phone call. DDX:The differential for syncope is extensive and includes, but is not limited to: arrythmia (Vtach, SVT, SSS, sinus arrest, AV block, bradycardia) aortic stenosis, AMI, HOCM, PE, atrial myxoma, pulmonary hypertension, orthostatic hypotension, (hypovolemia, drug effect, GB syndrome, micturition, cough, swall) carotid sinus sensitivity, Seizure, TIA/CVA, hypoglycemia,  Vertigo.  The emergent differential diagnosis for back pain includes but is not limited to fracture, muscle strain, cauda equina, spinal stenosis. DDD, ankylosing spondylitis, acute ligamentous injury, disk herniation, spondylolisthesis, Epidural compression syndrome, metastatic cancer, transverse myelitis, vertebral osteomyelitis, diskitis, kidney stone, pyelonephritis, AAA, Perforated ulcer, Retrocecal appendicitis, pancreatitis, bowel obstruction, retroperitoneal hemorrhage or mass, meningitis. Labs: I reviewed the labs which show urine without infection, proteinuria and glucose in the urine.  Hemoglobin with  normocytic anemia which has been trending down over time and I doubt symptomatic anemia as a cause of his syncope.  Patient's BNP is elevated without previous to compare.  CMP with elevated blood glucose, acute kidney injury and elevated BUN without anion gap.  Covid test is pending.  Troponin is within normal limits. Imaging: I personally reviewed the images (2 view thoracic plain film, lumbar spine plain film, 2 view chest x-ray, CT C-spine without contrast, CT head without contrast) which show(s) no acute abnormalities on my interpretation.  Follow-up CT C-spine shows T10 and T11 vertebral body fractures. EKG: Sinus rhythm at a rate of 61 with nonsustained ventricular trigeminy on my interpretation MDM: Patient here with history of CHF, peripheral vascular disease, several episodes of syncope.  He has acute kidney injury, this may represent worsening heart failure and I believe patient will need admission for echocardiogram  and full work-up.  Patient also has T10 and 11 fractures.  I consulted with neurosurgery who saw the patient here recommend TLSO splint placement, and repeat upright 2 view thoracic spine films look for stabilization of the spine with the splint in place.  I discussed the findings with the patient and his wife by telephone.  Pain is improved after pain medication. Patient disposition: Admit Patient condition: Fair. The patient appears reasonably stabilized for admission considering the current resources, flow, and capabilities available in the ED at this time, and I doubt any other [facility] requiring further screening and/or treatment in the ED prior to admission.   Final Clinical Impressions(s) / ED Diagnoses   Final diagnoses:  Syncope, unspecified syncope type  Thoracic compression fracture, closed, initial encounter [facility])    ED Discharge Orders    None       Margarita Mail, PA-C 02/28/19 [id number]    Lennice Sites, DO 03/01/19 [phone]

## 2019-02-28 NOTE — ED Triage Notes (Signed)
Pt arrived via EMS from home c/o several falls over the last few days. EMS states he had a bad fall today and has a back abrasion and severe back pain. Daughter concerned for pt passing out. No LOC or head trauma noted. Describes pain as "tightness" on mid-low back, no obvious deformity. Pt on blood thinner, CBG 318. Hx: diabetes.

## 2019-02-28 NOTE — ED Provider Notes (Addendum)
Medical screening examination/treatment/procedure(s) were conducted as a shared visit with non-physician practitioner(s) and myself.  I personally evaluated the patient during the encounter. Briefly, the patient is a 78 y.o. male with syncope, back pain.  Patient with multiple different type of syncopal episodes over the last several days.  Some have been unprovoked some have felt provoked.  Has right-sided thoracic back pain since most recent fall today.  History of heart failure, CAD.  History of diabetes.  EKG showed sinus rhythm with some ventricular trigeminy.  No ischemic changes.  Troponin within normal limits.  BNP mildly elevated.  No significant electrolyte abnormality.  Creatinine is up from baseline.  Possibly some dehydration.  No injuries to head or neck.  Does have thoracic compression fractures.  Spine surgery consulted.  Will place in TLSO. Will admit for further pain control.  Will admit for syncope work-up.  Hemodynamically stable throughout my care.  Head CT is unremarkable.  No concern for stroke.  Would benefit from echocardiogram and telemetry.  This chart was dictated using voice recognition software.  Despite best efforts to proofread,  errors can occur which can change the documentation meaning.     EKG Interpretation  Date/Time:  Monday February 28 2019 12:05:47 EST Ventricular Rate:  61 PR Interval:    QRS Duration: 89 QT Interval:  386 QTC Calculation: 389 R Axis:   78 Text Interpretation: Sinus rhythm Ventricular trigeminy Confirmed by Lennice Sites [phone]) on 02/28/2019 12:11:28 PM           Lennice Sites, DO 02/28/19 [place], [place], DO 02/28/19 1702

## 2019-02-28 NOTE — Progress Notes (Signed)
Orthopedic Tech Progress Note Patient Details:  Zachary Hoffman [birth year] [id number]  Patient ID: Zachary Hoffman, male   DOB: 07/05/1940, 78 y.o.   MRN: [id number]   Zachary Hoffman 02/28/2019, 5:10 PMCalled [facility] for TLSO brace.

## 2019-02-28 NOTE — Consult Note (Signed)
Reason for Consult: T10, T11 fracture Referring Physician: edp  Zachary Hoffman is an 78 y.o. male.   HPI:  78 year old male presents to the ED today after multiple syncopal episodes over the last couple days.  He presented with severe back pain.  He denies any radicular pain.  Denies any numbness tingling or weakness on his legs.  He is unable to lay on his back because of pain. Has a history of heart failure, CAD, diabetes.  Dates his pain is a 10 out of 10, constant, and achy.   Past Medical History:  Diagnosis Date  . Asthma   . CHF (congestive heart failure) ([place])    Echo 07/2006 with normal LV fxn, and diastolic dysfxn  . Coronary artery disease   . Diabetes mellitus    On insulin   . Diverticulosis 05/2009   Noted on screening colo   . Dyslipidemia   . Eczema   . Headache(784.0)   . Hypertension   . 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 MI (myocardial infarction) ([place])    S/p stenting per patient. Last stress 04/2007 negative   . Neuropathy   . Pancreatitis    P/H  . Peripheral arterial disease (H[facility])    S/p left femoro-femoral bypass. LE cath by Dr. Einar Gip 09/2007 without significant stenosis  . Tobacco abuse     Past Surgical History:  Procedure Laterality Date  . ADENOIDECTOMY    . CORONARY ANGIOPLASTY WITH STENT PLACEMENT  2004   Mid LAD, 95% first obtuse marginal   . DOPPLER ECHOCARDIOGRAPHY  07/28/2006   Mod. LVH,EF =>55%,LA mildly dilated,mitral annular ca+,AOV mildly sclerotic,trace AI  . FEMORAL-FEMORAL BYPASS GRAFT     In [place], on the left. Patent on cath 09/2007  . LOWER EXTREMITY ANGIOGRAM  09/20/2007   no significant stenosis  . Nuclear Stress Test  09/17/2010   No ischemia  . REPLACEMENT TOTAL HIP W/  RESURFACING IMPLANTS     Left  . TONSILLECTOMY      No Known Allergies  Social History   Tobacco Use  . Smoking status: Current Some Day Smoker    Packs/day: 0.50    Years: 50.00    Pack years: 25.00    Types: Cigarettes  .  Smokeless tobacco: Never Used  Substance Use Topics  . Alcohol use: No    Family History  Problem Relation Age of Onset  . Stroke Mother   . Diabetes Father   . Cancer Father   . Asthma Son   . Multiple sclerosis Daughter   . Eczema Daughter   . Allergic rhinitis Neg Hx   . Angioedema Neg Hx   . Immunodeficiency Neg Hx   . Urticaria Neg Hx      Review of Systems  Positive ROS: As above  All other systems have been reviewed and were otherwise negative with the exception of those mentioned in the HPI and as above.  Objective: Vital signs in last 24 hours: Temp:  [98.9 F (37.2 C)] 98.9 F (37.2 C) (11/23 1054) Pulse Rate:  [63-71] 70 (11/23 1530) Resp:  [12-18] 12 (11/23 1530) BP: (130-162)/(61-93) 162/93 (11/23 1530) SpO2:  [94 %-98 %] 95 % (11/23 1530)  General Appearance: Alert, cooperative, moderate distress, appears stated age Head: Normocephalic, without obvious abnormality, atraumatic Eyes: PERRL, conjunctiva/corneas clear, EOM's intact, fundi benign, both eyes   Back: tender upon palpation over lower thoracic region Lungs:  respirations unlabored Heart: Regular rate and rhythm Skin:  Skin color, texture, turgor normal, no rashes or lesions  NEUROLOGIC:   Mental status: A&O x4, no aphasia, good attention span, Memory and fund of knowledge Motor Exam - grossly normal, normal tone and bulk Sensory Exam - grossly normal Reflexes: symmetric, no pathologic reflexes, No Hoffman's, No clonus Coordination - grossly normal Gait -not tested Balance -not tested Cranial Nerves: I: smell Not tested  II: visual acuity  OS: na    OD: na  II: visual fields Full to confrontation  II: pupils   III,VII: ptosis   III,IV,VI: extraocular muscles    V: mastication   V: facial light touch sensation    V,VII: corneal reflex    VII: facial muscle function - upper    VII: facial muscle function - lower   VIII: hearing   IX: soft palate elevation    IX,X: gag reflex   XI:  trapezius strength    XI: sternocleidomastoid strength   XI: neck flexion strength    XII: tongue strength      Data Review Lab Results  Component Value Date   WBC 4.6 02/28/2019   HGB 10.4 (L) 02/28/2019   HCT 32.9 (L) 02/28/2019   MCV 88.2 02/28/2019   PLT 216 02/28/2019   Lab Results  Component Value Date   NA 137 02/28/2019   K 4.5 02/28/2019   CL 111 02/28/2019   CO2 20 (L) 02/28/2019   BUN 53 (H) 02/28/2019   CREATININE 3.35 (H) 02/28/2019   GLUCOSE 245 (H) 02/28/2019   No results found for: INR, PROTIME  Radiology: Dg Chest 2 View  Result Date: 02/28/2019 CLINICAL DATA:  Fall. EXAM: CHEST - 2 VIEW COMPARISON:  Chest radiograph 12/15/2013 FINDINGS: Heart size within normal limits.  Aortic atherosclerosis. Shallow inspiration radiograph with crowding of the central bronchovascular markings. Suspected mild pulmonary vascular congestion with a small amount of fluid within the minor fissure. No airspace consolidation. No displaced fracture is identified. Thoracic spine degenerative change with multilevel bridging ventral osteophytes. IMPRESSION: Shallow inspiration radiograph. Suspected mild pulmonary vascular congestion with a small amount of fluid within the minor fissure. Aortic atherosclerosis. Electronically Signed   By: Kellie Simmering DO   On: 02/28/2019 14:04   Dg Thoracic Spine 2 View  Result Date: 02/28/2019 CLINICAL DATA:  Multiple falls EXAM: THORACIC SPINE 2 VIEWS COMPARISON:  None. FINDINGS: There is no evidence of thoracic spine fracture. Alignment is normal. Multilevel degenerative changes are present. No other significant bone abnormalities are identified. IMPRESSION: No compression deformity. Electronically Signed   By: Macy Mis M.D.   On: 02/28/2019 14:05   Dg Lumbar Spine Complete  Result Date: 02/28/2019 CLINICAL DATA:  Multiple falls EXAM: LUMBAR SPINE - COMPLETE 4+ VIEW COMPARISON:  November 01, 2018 FINDINGS: Stable vertebral body heights and  alignment. No acute fracture identified. Multilevel degenerative changes with disc height loss, endplate osteophytes, and facet hypertrophy again noted. Partially imaged right total hip arthroplasty. IMPRESSION: No new finding.  Multilevel degenerative changes. Electronically Signed   By: Macy Mis M.D.   On: 02/28/2019 14:03   Ct Head Wo Contrast  Result Date: 02/28/2019 CLINICAL DATA:  78 year old male with history of recurrent syncope. Fall today. Back pain. EXAM: CT HEAD WITHOUT CONTRAST CT CERVICAL SPINE WITHOUT CONTRAST TECHNIQUE: Multidetector CT imaging of the head and cervical spine was performed following the standard protocol without intravenous contrast. Multiplanar CT image reconstructions of the cervical spine were also generated. COMPARISON:  None. FINDINGS: CT HEAD FINDINGS Brain: Mild cerebral atrophy.  Patchy and confluent areas of decreased attenuation are noted throughout the deep and periventricular white matter of the cerebral hemispheres bilaterally, compatible with chronic microvascular ischemic disease. Well-defined areas of low attenuation in the left cerebellar hemisphere related to remote left cerebellar infarct. No evidence of acute infarction, hemorrhage, hydrocephalus, extra-axial collection or mass lesion/mass effect. Vascular: No hyperdense vessel or unexpected calcification. Skull: Normal. Negative for fracture or focal lesion. Sinuses/Orbits: No acute finding. Other: None. CT CERVICAL SPINE FINDINGS Alignment: Mild reversal of normal cervical lordosis centered at the level of C4, likely chronic and related to degenerative disease. Skull base and vertebrae: No acute fracture. No primary bone lesion or focal pathologic process. Soft tissues and spinal canal: No prevertebral fluid or swelling. No visible canal hematoma. Disc levels: Severe multilevel degenerative disc disease, most pronounced at C3-C4, C4-C5, C5-C6 and C6-C7. Severe multilevel facet arthropathy. Upper chest:  Unremarkable. Other: There are no aggressive appearing lytic or blastic lesions noted in the visualized portions of the skeleton. IMPRESSION: 1. No evidence of significant acute traumatic injury to the skull, brain or cervical spine. 2. 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 3. 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 Electronically Signed   By: Vinnie Langton M.D.   On: 02/28/2019 14:18   Ct Cervical Spine Wo Contrast  Result Date: 02/28/2019 CLINICAL DATA:  78 year old male with history of recurrent syncope. Fall today. Back pain. EXAM: CT HEAD WITHOUT CONTRAST CT CERVICAL SPINE WITHOUT CONTRAST TECHNIQUE: Multidetector CT imaging of the head and cervical spine was performed following the standard protocol without intravenous contrast. Multiplanar CT image reconstructions of the cervical spine were also generated. COMPARISON:  None. FINDINGS: CT HEAD FINDINGS Brain: Mild cerebral atrophy. Patchy and confluent areas of decreased attenuation are noted throughout the deep and periventricular white matter of the cerebral hemispheres bilaterally, compatible with chronic microvascular ischemic disease. Well-defined areas of low attenuation in the left cerebellar hemisphere related to remote left cerebellar infarct. No evidence of acute infarction, hemorrhage, hydrocephalus, extra-axial collection or mass lesion/mass effect. Vascular: No hyperdense vessel or unexpected calcification. Skull: Normal. Negative for fracture or focal lesion. Sinuses/Orbits: No acute finding. Other: None. CT CERVICAL SPINE FINDINGS Alignment: Mild reversal of normal cervical lordosis centered at the level of C4, likely chronic and related to degenerative disease. Skull base and vertebrae: No acute fracture. No primary bone lesion or focal pathologic process. Soft tissues and spinal  canal: No prevertebral fluid or swelling. No visible canal hematoma. Disc levels: Severe multilevel degenerative disc disease, most pronounced at C3-C4, C4-C5, C5-C6 and C6-C7. Severe multilevel facet arthropathy. Upper chest: Unremarkable. Other: There are no aggressive appearing lytic or blastic lesions noted in the visualized portions of the skeleton. IMPRESSION: 1. No evidence of significant acute traumatic injury to the skull, brain or cervical spine. 2. 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 3. 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 Electronically Signed   By: Vinnie Langton M.D.   On: 02/28/2019 14:18   Ct Thoracic Spine Wo Contrast  Result Date: 02/28/2019 CLINICAL DATA:  Severe back pain and back brace gin secondary to a fall today. Multiple recent falls. EXAM: CT THORACIC SPINE WITHOUT CONTRAST TECHNIQUE: Multidetector CT images of the thoracic were obtained using the standard protocol without intravenous contrast. COMPARISON:  Radiographs dated 02/28/2019 FINDINGS: Alignment: Normal. Vertebrae: There is an acute oblique fracture through the T10 and T11 vertebral bodies. The fracture involves primarily the T10 vertebral body that extends into the right anterior superior aspect of the superior endplate T11 fracturing through anterior and right lateral osteophytes that have previously fused the T10 and T11 levels. The fracture does not appear to extend into the pedicles or posterior elements at either level there is approximately 3 mm of distraction at the anterior aspect of vertebral bodies. No visible hemorrhage into the spinal canal at the site of the fracture or elsewhere in the thoracic spine. The remainder of the thoracic spine is intact. The spine appears to be fused posteriorly from T2 through T7 and spine is fused anteriorly from at  least T4 through T9-10 and was previously fused at T10-11 prior to this fracture the T11-12 level does not appear to be fused. There is multilevel foraminal stenosis in the mid upper thoracic spine due to the fusions. No spinal stenosis. Paraspinal and other soft tissues: Aortic atherosclerosis. No evidence of a paraspinal hematoma at the site of the fracture. Disc levels: Diffuse disc space narrowing with fusion of much of the thoracic spine as described above. No discrete disc protrusions. IMPRESSION: 1. Acute oblique fracture of the T10 and T11 vertebral bodies as described above. The fracture does not appear to extend into the pedicles or posterior elements at either level. The majority of the thoracic spine is fused as described above. 2. The remainder of the thoracic spine is intact. 3. No evidence of epidural hematoma or other discrete severe encroachment upon the spinal canal. 4. Aortic Atherosclerosis (ICD10-I70.0). Electronically Signed   By: Lorriane Shire M.D.   On: 02/28/2019 16:07    Assessment/Plan: 78 year old male presented to the ED today after sustaining multiple falls over the last couple days.  Today he had a significant amount of back pain.  CT thoracic spine shows an acute oblique fracture through the T10 and T11 vertebral bodies.  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  The fracture mainly involves T10 vertebral body with extension into the anterior superior aspects of the T11 vertebral body with no posterior element involvement.  Looks as though the plan is to admit him for syncopal episodes and pain management.  Would recommend a TLSO brace with upright x-rays tomorrow in the TLSO brace.  I do not think this needs any surgical intervention at this time.  Ocie Cornfield Codey Burling 02/28/2019 4:54 PM

## 2019-02-28 NOTE — H&P (Addendum)
[facility] History and Physical  Zachary Hoffman [id number] DOB: [birth year] DOA: 02/28/2019  Referring physician: ED  PCP: Zachary Ebbs, MD   Chief Complaint: Back pain  HPI: Zachary Hoffman is a 78 y.o. male with past medical history of diastolic CHF, CKD stage IV, diabetes mellitus on insulin, hyperlipidemia, hypertension, tonic low backache, coronary artery disease status post stent, history of peripheral vascular disease, presented to hospital with complaints of passing out spells for the last few days.  Patient had at least 2 episodes of syncope first one was preceded by dizziness the second episode was without any warning signs.  Patient fell on the back of his head.  After his fall, he started having severe back pain to the point where he was unable to ambulate much.  Patient denies any chest pain, palpitation prior to the fall.  Denies any fever, chills or rigor.  Denied urinary urgency, frequency or dysuria.  Denied nausea, vomiting or diarrhea.  Denies recent travel or sick contact.  Patient does have history of chronic kidney disease and follows up with nephrology Zachary Hoffman as outpatient.  Patient was recently seen by cardiology on 02/16/2019 for a follow-up visit.  ED Course: In the ED, patient complained of severe thoracic pain with difficulty changing in positions.  Patient underwent x-ray of his thoracic and lumbar spine followed by CT scan of his cervical thoracic and lumbar spine.  Degenerative joint changes were noted and in the C-spine there was mention of T10 and T11 oblique fracture.  Patient was then considered for admission to the hospital for vertebral fractures, ambulatory dysfunction, intractable pain in acute kidney injury.  Review of Systems:  All systems were reviewed and were negative unless otherwise mentioned in the HPI  Past Medical History:  Diagnosis Date   Asthma    CHF (congestive heart failure) ([place])    Echo 07/2006 with normal LV fxn, and  diastolic dysfxn   Coronary artery disease    Diabetes mellitus    On insulin    Diverticulosis 05/2009   Noted on screening colo    Dyslipidemia    Eczema    Headache(784.0)    Hypertension    Kidney disease, chronic, stage II (GFR 60-89 ml/min)    MI (myocardial infarction) ([place])    S/p stenting per patient. Last stress 04/2007 negative    Neuropathy    Pancreatitis    P/H   Peripheral arterial disease ([place])    S/p left femoro-femoral bypass. LE cath by Zachary Hoffman 09/2007 without significant stenosis   Tobacco abuse    Past Surgical History:  Procedure Laterality Date   ADENOIDECTOMY     CORONARY ANGIOPLASTY WITH STENT PLACEMENT  2004   Mid LAD, 95% first obtuse marginal    DOPPLER ECHOCARDIOGRAPHY  07/28/2006   Mod. LVH,EF =>55%,LA mildly dilated,mitral annular ca+,AOV mildly sclerotic,trace AI   FEMORAL-FEMORAL BYPASS GRAFT     In [place], on the left. Patent on cath 09/2007   LOWER EXTREMITY ANGIOGRAM  09/20/2007   no significant stenosis   Nuclear Stress Test  09/17/2010   No ischemia   REPLACEMENT TOTAL HIP W/  RESURFACING IMPLANTS     Left   TONSILLECTOMY      Social History:  reports that he has been smoking cigarettes. He has a 25.00 pack-year smoking history. He has never used smokeless tobacco. He reports that he does not drink alcohol or use drugs.  No Known Allergies  Family History  Problem Relation Age  of Onset   Stroke Mother    Diabetes Father    Cancer Father    Asthma Son    Multiple sclerosis Daughter    Eczema Daughter    Allergic rhinitis Neg Hx    Angioedema Neg Hx    Immunodeficiency Neg Hx    Urticaria Neg Hx      Prior to Admission medications   Medication Sig Start Date End Date Taking? Authorizing Provider  amLODipine (NORVASC) 10 MG tablet Take 10 mg by mouth daily. 02/07/19  Yes [provider]  aspirin EC 81 MG tablet Take 81 mg by mouth daily.   Yes [provider]  cetirizine  (ZYRTEC) 10 MG tablet Take 10 mg by mouth daily as needed for allergies.  06/13/15  Yes [provider]  clopidogrel (PLAVIX) 75 MG tablet Take 75 mg by mouth daily.   Yes [provider]  doxazosin (CARDURA) 4 MG tablet Take 4 mg by mouth daily.  02/15/18  Yes [provider]  fluticasone (FLONASE) 50 MCG/ACT nasal spray Place 2 sprays into both nostrils daily as needed for allergies.  05/18/15  Yes [provider]  furosemide (LASIX) 40 MG tablet Take 40 mg by mouth.   Yes [provider]  hydrALAZINE (APRESOLINE) 50 MG tablet Take 50 mg by mouth 2 (two) times daily.  11/05/18  Yes [provider]  insulin glulisine (APIDRA) 100 UNIT/ML injection Inject 10 Units into the skin 3 (three) times daily with meals. Reported on 06/29/2015   Yes [provider]  LANTUS SOLOSTAR 100 UNIT/ML Solostar Pen Inject 45 Units into the skin daily.  02/07/15  Yes [provider]  losartan (COZAAR) 50 MG tablet Take 50 mg by mouth daily.  10/29/18  Yes [provider]  metoprolol succinate (TOPROL-XL) 100 MG 24 hr tablet Take 100 mg by mouth daily. Take with or immediately following a meal.   Yes [provider]  MITIGARE 0.6 MG CAPS Take 0.6 mg by mouth daily as needed (gout attacks).   Yes [provider]  pantoprazole (PROTONIX) 40 MG tablet Take 40 mg by mouth daily. Reported on 06/29/2015   Yes [provider]  pregabalin (LYRICA) 150 MG capsule Take 150 mg by mouth 3 (three) times daily.  04/28/18  Yes [provider]  simvastatin (ZOCOR) 20 MG tablet Take 20 mg by mouth every evening.   Yes [provider]  tiZANidine (ZANAFLEX) 4 MG tablet Take 4 mg by mouth 2 (two) times daily as needed for muscle spasms.  02/15/18  Yes [provider]  PRODIGY NO CODING BLOOD GLUC test strip U TID 06/15/18   [provider]    Physical Exam: Vitals:   02/28/19 1414 02/28/19 1430 02/28/19  1500 02/28/19 1530  BP: (!) 155/72 135/69 138/66 (!) 162/93  Pulse: 65 68 71 70  Resp: 16 14 17 12   Temp:      TempSrc:      SpO2: 96% 96% 96% 95%  Height:       Wt Readings from Last 3 Encounters:  02/16/19 95.3 kg  11/26/17 80.6 kg  10/31/16 87.3 kg   Body mass index is 32.89 kg/m.  General: Obese built, in moderate stress due to pain, HENT: Normocephalic, pupils equally reacting to light and accommodation.  No scleral pallor or icterus noted. Oral mucosa is moist.  Chest: Diminished breath sounds bilaterally.  No obvious crackles or wheezes noted.  Tenderness noted over the thoracic spine with  paravertebral spasm. CVS: S1 &S2 heard. No murmur.  Regular rate and rhythm. Abdomen: Soft, nontender, nondistended.  Bowel sounds are heard.  Liver is not palpable, no abdominal mass palpated Extremities: No cyanosis, clubbing or edema.  Peripheral pulses are palpable. Psych: Alert, awake and oriented, normal mood CNS:  No cranial nerve deficits.  Moving all the lower extremities..   No cerebellar signs.   Skin: Warm and dry.  No rashes noted.  Labs on Admission:   CBC: Recent Labs  Lab 02/28/19 1149  WBC 4.6  NEUTROABS 2.9  HGB 10.4*  HCT 32.9*  MCV 88.2  PLT 123XX123    Basic Metabolic Panel: Recent Labs  Lab 02/28/19 1149  NA 137  K 4.5  CL 111  CO2 20*  GLUCOSE 245*  BUN 53*  CREATININE 3.35*  CALCIUM 8.8*    Liver Function Tests: Recent Labs  Lab 02/28/19 1149  AST 17  ALT 13  ALKPHOS 89  BILITOT 0.9  PROT 7.2  ALBUMIN 3.5   No results for input(s): LIPASE, AMYLASE in the last 168 hours. No results for input(s): AMMONIA in the last 168 hours.  Cardiac Enzymes: No results for input(s): CKTOTAL, CKMB, CKMBINDEX, TROPONINI in the last 168 hours.  BNP (last 3 results) Recent Labs    02/28/19 1149  BNP 542.6*    ProBNP (last 3 results) No results for input(s): PROBNP in the last 8760 hours.  CBG: Recent Labs  Lab 02/28/19 1218  GLUCAP 232*     Lipase     Component Value Date/Time   LIPASE 108 (H) 06/11/2011 1104     Urinalysis    Component Value Date/Time   COLORURINE YELLOW 02/28/2019 1251   APPEARANCEUR CLEAR 02/28/2019 1251   LABSPEC 1.012 02/28/2019 1251   PHURINE 5.0 02/28/2019 1251   GLUCOSEU 50 (A) 02/28/2019 1251   HGBUR NEGATIVE 02/28/2019 1251   BILIRUBINUR NEGATIVE 02/28/2019 1251   KETONESUR NEGATIVE 02/28/2019 1251   PROTEINUR 100 (A) 02/28/2019 1251   UROBILINOGEN 0.2 09/15/2014 1940   NITRITE NEGATIVE 02/28/2019 1251   LEUKOCYTESUR NEGATIVE 02/28/2019 1251     Drugs of Abuse  No results found for: LABOPIA, COCAINSCRNUR, LABBENZ, AMPHETMU, THCU, LABBARB    Radiological Exams on Admission: Dg Chest 2 View  Result Date: 02/28/2019 CLINICAL DATA:  Fall. EXAM: CHEST - 2 VIEW COMPARISON:  Chest radiograph 12/15/2013 FINDINGS: Heart size within normal limits.  Aortic atherosclerosis. Shallow inspiration radiograph with crowding of the central bronchovascular markings. Suspected mild pulmonary vascular congestion with a small amount of fluid within the minor fissure. No airspace consolidation. No displaced fracture is identified. Thoracic spine degenerative change with multilevel bridging ventral osteophytes. IMPRESSION: Shallow inspiration radiograph. Suspected mild pulmonary vascular congestion with a small amount of fluid within the minor fissure. Aortic atherosclerosis. Electronically Signed   By: Kellie Simmering DO   On: 02/28/2019 14:04   Dg Thoracic Spine 2 View  Result Date: 02/28/2019 CLINICAL DATA:  Multiple falls EXAM: THORACIC SPINE 2 VIEWS COMPARISON:  None. FINDINGS: There is no evidence of thoracic spine fracture. Alignment is normal. Multilevel degenerative changes are present. No other significant bone abnormalities are identified. IMPRESSION: No compression deformity. Electronically Signed   By: Macy Mis M.D.   On: 02/28/2019 14:05   Dg Lumbar Spine Complete  Result Date:  02/28/2019 CLINICAL DATA:  Multiple falls EXAM: LUMBAR SPINE - COMPLETE 4+ VIEW COMPARISON:  November 01, 2018 FINDINGS: Stable vertebral body heights and alignment. No acute fracture identified. Multilevel degenerative changes  with disc height loss, endplate osteophytes, and facet hypertrophy again noted. Partially imaged right total hip arthroplasty. IMPRESSION: No new finding.  Multilevel degenerative changes. Electronically Signed   By: Macy Mis M.D.   On: 02/28/2019 14:03   Ct Head Wo Contrast  Result Date: 02/28/2019 CLINICAL DATA:  78 year old male with history of recurrent syncope. Fall today. Back pain. EXAM: CT HEAD WITHOUT CONTRAST CT CERVICAL SPINE WITHOUT CONTRAST TECHNIQUE: Multidetector CT imaging of the head and cervical spine was performed following the standard protocol without intravenous contrast. Multiplanar CT image reconstructions of the cervical spine were also generated. COMPARISON:  None. FINDINGS: CT HEAD FINDINGS Brain: Mild cerebral atrophy. Patchy and confluent areas of decreased attenuation are noted throughout the deep and periventricular white matter of the cerebral hemispheres bilaterally, compatible with chronic microvascular ischemic disease. Well-defined areas of low attenuation in the left cerebellar hemisphere related to remote left cerebellar infarct. No evidence of acute infarction, hemorrhage, hydrocephalus, extra-axial collection or mass lesion/mass effect. Vascular: No hyperdense vessel or unexpected calcification. Skull: Normal. Negative for fracture or focal lesion. Sinuses/Orbits: No acute finding. Other: None. CT CERVICAL SPINE FINDINGS Alignment: Mild reversal of normal cervical lordosis centered at the level of C4, likely chronic and related to degenerative disease. Skull base and vertebrae: No acute fracture. No primary bone lesion or focal pathologic process. Soft tissues and spinal canal: No prevertebral fluid or swelling. No visible canal hematoma. Disc  levels: Severe multilevel degenerative disc disease, most pronounced at C3-C4, C4-C5, C5-C6 and C6-C7. Severe multilevel facet arthropathy. Upper chest: Unremarkable. Other: There are no aggressive appearing lytic or blastic lesions noted in the visualized portions of the skeleton. IMPRESSION: 1. No evidence of significant acute traumatic injury to the skull, brain or cervical spine. 2. 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 3. 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 Electronically Signed   By: Vinnie Langton M.D.   On: 02/28/2019 14:18   Ct Cervical Spine Wo Contrast  Result Date: 02/28/2019 CLINICAL DATA:  78 year old male with history of recurrent syncope. Fall today. Back pain. EXAM: CT HEAD WITHOUT CONTRAST CT CERVICAL SPINE WITHOUT CONTRAST TECHNIQUE: Multidetector CT imaging of the head and cervical spine was performed following the standard protocol without intravenous contrast. Multiplanar CT image reconstructions of the cervical spine were also generated. COMPARISON:  None. FINDINGS: CT HEAD FINDINGS Brain: Mild cerebral atrophy. Patchy and confluent areas of decreased attenuation are noted throughout the deep and periventricular white matter of the cerebral hemispheres bilaterally, compatible with chronic microvascular ischemic disease. Well-defined areas of low attenuation in the left cerebellar hemisphere related to remote left cerebellar infarct. No evidence of acute infarction, hemorrhage, hydrocephalus, extra-axial collection or mass lesion/mass effect. Vascular: No hyperdense vessel or unexpected calcification. Skull: Normal. Negative for fracture or focal lesion. Sinuses/Orbits: No acute finding. Other: None. CT CERVICAL SPINE FINDINGS Alignment: Mild reversal of normal cervical lordosis centered at the level of C4, likely chronic and  related to degenerative disease. Skull base and vertebrae: No acute fracture. No primary bone lesion or focal pathologic process. Soft tissues and spinal canal: No prevertebral fluid or swelling. No visible canal hematoma. Disc levels: Severe multilevel degenerative disc disease, most pronounced at C3-C4, C4-C5, C5-C6 and C6-C7. Severe multilevel facet arthropathy. Upper chest: Unremarkable. Other: There are no aggressive appearing lytic or blastic lesions noted in the visualized portions of the skeleton. IMPRESSION: 1. No  evidence of significant acute traumatic injury to the skull, brain or cervical spine. 2. 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 3. 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 Electronically Signed   By: Vinnie Langton M.D.   On: 02/28/2019 14:18   Ct Thoracic Spine Wo Contrast  Result Date: 02/28/2019 CLINICAL DATA:  Severe back pain and back brace gin secondary to a fall today. Multiple recent falls. EXAM: CT THORACIC SPINE WITHOUT CONTRAST TECHNIQUE: Multidetector CT images of the thoracic were obtained using the standard protocol without intravenous contrast. COMPARISON:  Radiographs dated 02/28/2019 FINDINGS: Alignment: Normal. Vertebrae: There is an acute oblique fracture through the T10 and T11 vertebral bodies. The fracture involves primarily the T10 vertebral body that extends into the right anterior superior aspect of the superior endplate T11 fracturing through anterior and right lateral osteophytes that have previously fused the T10 and T11 levels. The fracture does not appear to extend into the pedicles or posterior elements at either level there is approximately 3 mm of distraction at the anterior aspect of vertebral bodies. No visible hemorrhage into the spinal canal at the site of the fracture or elsewhere in the thoracic  spine. The remainder of the thoracic spine is intact. The spine appears to be fused posteriorly from T2 through T7 and spine is fused anteriorly from at least T4 through T9-10 and was previously fused at T10-11 prior to this fracture the T11-12 level does not appear to be fused. There is multilevel foraminal stenosis in the mid upper thoracic spine due to the fusions. No spinal stenosis. Paraspinal and other soft tissues: Aortic atherosclerosis. No evidence of a paraspinal hematoma at the site of the fracture. Disc levels: Diffuse disc space narrowing with fusion of much of the thoracic spine as described above. No discrete disc protrusions. IMPRESSION: 1. Acute oblique fracture of the T10 and T11 vertebral bodies as described above. The fracture does not appear to extend into the pedicles or posterior elements at either level. The majority of the thoracic spine is fused as described above. 2. The remainder of the thoracic spine is intact. 3. No evidence of epidural hematoma or other discrete severe encroachment upon the spinal canal. 4. Aortic Atherosclerosis (ICD10-I70.0). Electronically Signed   By: Lorriane Shire M.D.   On: 02/28/2019 16:07    EKG: Personally reviewed by me which shows normal sinus rhythm with occasional ventricular trigeminy.  Assessment/Plan Principal Problem:   Intractable back pain Active Problems:   Hypertension   DM2 (diabetes mellitus, type 2) ([facility])   Hyperlipidemia with target LDL less than 70   CKD (chronic kidney disease), stage IV ([facility])   T10 vertebral fracture ([facility])   T11 vertebral fracture ([facility])   Ambulatory dysfunction   AKI (acute kidney injury) ([place])  Multiple episodes of syncope.  Will place on telemetry monitor.  Check for arrhythmia.  Check electrolytes closely.  Check magnesium.  Patient did have normal LV function by nuclear stress test in 2012.  Will get 2D echocardiogram.  Negative troponins x2.  Patient was recently seen by Dr. Sallyanne Kuster, cardiology as  outpatient.  Intractable back pain with T10 and T11 fracture status post syncope.  Neurosurgery has seen the patient and recommend TSLO brace and getting x-ray of the thoracic spine in a.m. to see stability of fracture.  Will hold aspirin and Plavix for now just in case patient would need surgical  intervention..  Continue with the Lyrica and Zanaflex.  Of note patient does have chronic back pain.  Hypertension.  We will continue with amlodipine Cardura hydralazine  and metoprolol.  Hold losartan for now because of acute on chronic kidney disease.  Mild AKI on chronic kidney disease stage IV with baseline around 2.5.  Creatinine today is 3.3.  Gentle hydration overnight.  Reassess for fluid needed in a.m.  Hold losartan for today.  Hold Lasix as well.  Diabetes mellitus type 2.  On Lantus and Apidra.  We will continue with sliding scale insulin, diabetic diet.   History of peripheral vascular disease status post femorofemoral bypass.  History of CAD.  Patient follows up with cardiology as outpatient.  Counseled about quitting smoking.  Hyperlipidemia.  Continue simvastatin.  Chronic nicotine abuse cigarette smoking.  Counseled about it.  We will put the patient on nicotine patch while in the hospital.  DVT Prophylaxis: Heparin subcu  Consultant: Neurosurgery   Code Status: Full code.  Family to reach with details once his living will is ascertained  Microbiology none  Antibiotics: None  Family Communication:  Patients' condition and plan of care including tests being ordered have been discussed with the patient and the patient's daughter who indicate understanding and agree with the plan.  Disposition Plan: Home with home health/skilled nursing facility depending upon patient's progress and potential need for surgical intervention.  Severity of Illness: The appropriate patient status for this patient is INPATIENT. Inpatient status is judged to be reasonable and necessary in order to  provide the required intensity of service to ensure the patient's safety. The patient's presenting symptoms, physical exam findings, and initial radiographic and laboratory data in the context of their chronic comorbidities is felt to place them at high risk for further clinical deterioration. Furthermore, it is not anticipated that the patient will be medically stable for discharge from the hospital within 2 midnights of admission. I certify that at the point of admission it is my clinical judgment that the patient will require inpatient hospital care spanning beyond 2 midnights from the point of admission due to high intensity of service, high risk for further deterioration and high frequency of surveillance required.   Signed, Flora Lipps, MD [facility] 02/28/2019

## 2019-03-01 ENCOUNTER — Inpatient Hospital Stay (HOSPITAL_COMMUNITY): Payer: Medicare Other

## 2019-03-01 LAB — CBC
HCT: 30.6 % — ABNORMAL LOW (ref 39.0–52.0)
Hemoglobin: 9.4 g/dL — ABNORMAL LOW (ref 13.0–17.0)
MCH: 27.4 pg (ref 26.0–34.0)
MCHC: 30.7 g/dL (ref 30.0–36.0)
MCV: 89.2 fL (ref 80.0–100.0)
Platelets: 208 10*3/uL (ref 150–400)
RBC: 3.43 MIL/uL — ABNORMAL LOW (ref 4.22–5.81)
RDW: 14.9 % (ref 11.5–15.5)
WBC: 3.6 10*3/uL — ABNORMAL LOW (ref 4.0–10.5)
nRBC: 0 % (ref 0.0–0.2)

## 2019-03-01 LAB — BASIC METABOLIC PANEL
Anion gap: 8 (ref 5–15)
BUN: 51 mg/dL — ABNORMAL HIGH (ref 8–23)
CO2: 19 mmol/L — ABNORMAL LOW (ref 22–32)
Calcium: 8.7 mg/dL — ABNORMAL LOW (ref 8.9–10.3)
Chloride: 111 mmol/L (ref 98–111)
Creatinine, Ser: 3.23 mg/dL — ABNORMAL HIGH (ref 0.61–1.24)
GFR calc Af Amer: 20 mL/min — ABNORMAL LOW (ref >60)
GFR calc non Af Amer: 18 mL/min — ABNORMAL LOW (ref >60)
Glucose, Bld: 135 mg/dL — ABNORMAL HIGH (ref 70–99)
Potassium: 4.6 mmol/L (ref 3.5–5.1)
Sodium: 138 mmol/L (ref 135–145)

## 2019-03-01 LAB — GLUCOSE, CAPILLARY
Glucose-Capillary: 38 mg/dL — CL (ref 70–99)
Glucose-Capillary: 58 mg/dL — ABNORMAL LOW (ref 70–99)
Glucose-Capillary: 66 mg/dL — ABNORMAL LOW (ref 70–99)
Glucose-Capillary: 73 mg/dL (ref 70–99)
Glucose-Capillary: 81 mg/dL (ref 70–99)
Glucose-Capillary: 147 mg/dL — ABNORMAL HIGH (ref 70–99)
Glucose-Capillary: 145 mg/dL — ABNORMAL HIGH (ref 70–99)

## 2019-03-01 LAB — PROTIME-INR
INR: 1.2 (ref 0.8–1.2)
Prothrombin Time: 15.5 seconds — ABNORMAL HIGH (ref 11.4–15.2)

## 2019-03-01 MED ORDER — PREGABALIN 75 MG PO CAPS
75.0000 mg | ORAL_CAPSULE | Freq: Two times a day (BID) | ORAL | Status: DC
  Administered 2019-03-01 – 2019-03-02 (×3): 75 mg via ORAL
  Filled 2019-03-01 (×3): qty 1

## 2019-03-01 MED ORDER — INSULIN GLARGINE 100 UNIT/ML ~~LOC~~ SOLN
15.0000 [IU] | Freq: Every day | SUBCUTANEOUS | Status: DC
  Administered 2019-03-01: 22:00:00 15 [IU] via SUBCUTANEOUS
  Filled 2019-03-01 (×2): qty 0.15

## 2019-03-01 MED ORDER — CLOPIDOGREL BISULFATE 75 MG PO TABS
75.0000 mg | ORAL_TABLET | Freq: Every day | ORAL | Status: DC
  Administered 2019-03-01 – 2019-03-02 (×2): 75 mg via ORAL
  Filled 2019-03-01 (×2): qty 1

## 2019-03-01 MED ORDER — INSULIN GLARGINE 100 UNIT/ML ~~LOC~~ SOLN
30.0000 [IU] | Freq: Every day | SUBCUTANEOUS | Status: DC
  Filled 2019-03-01: qty 0.3

## 2019-03-01 MED ORDER — ASPIRIN EC 81 MG PO TBEC
81.0000 mg | DELAYED_RELEASE_TABLET | Freq: Every day | ORAL | Status: DC
  Administered 2019-03-01 – 2019-03-02 (×2): 81 mg via ORAL
  Filled 2019-03-01 (×2): qty 1

## 2019-03-01 NOTE — CV Procedure (Addendum)
Echocardiogram not completed attempted twice, the first time patient was on right side for pain management. Second attempt I was asked to wait until after his radiology visit.   Zachary Hoffman

## 2019-03-01 NOTE — Progress Notes (Signed)
PROGRESS NOTE    Zachary Hoffman  [id number] DOB: 20-Nov-1940 DOA: 02/28/2019 PCP: [name], MD  Brief Narrative:78 y.o. male with past medical history of diastolic CHF, CKD stage IV, diabetes mellitus on insulin, hyperlipidemia, hypertension, tonic low backache, coronary artery disease status post stent, history of peripheral vascular disease, presented to hospital with complaints of passing out spells for the last few days.  Patient had at least 2 episodes of syncope first one was preceded by dizziness the second episode was without any warning signs.  Patient fell on the back of his head.  After his fall, he started having severe back pain to the point where he was unable to ambulate much.  Patient denies any chest pain, palpitation prior to the fall.  Denies any fever, chills or rigor.  Denied urinary urgency, frequency or dysuria.  Denied nausea, vomiting or diarrhea.  Denies recent travel or sick contact.  Patient does have history of chronic kidney disease and follows up with nephrology Dr. Carolin Sicks as outpatient.  Patient was recently seen by cardiology on 02/16/2019 for a follow-up visit.  ED Course: In the ED, patient complained of severe thoracic pain with difficulty changing in positions.  Patient underwent x-ray of his thoracic and lumbar spine followed by CT scan of his cervical thoracic and lumbar spine.  Degenerative joint changes were noted and in the C-spine there was mention of T10 and T11 oblique fracture.  Patient was then considered for admission to the hospital for vertebral fractures, ambulatory dysfunction, intractable pain in acute kidney injury.  Assessment & Plan:   Principal Problem:   Intractable back pain Active Problems:   Hypertension   DM2 (diabetes mellitus, type 2) (HCC)   Hyperlipidemia with target LDL less than 70   CKD (chronic kidney disease), stage IV (HCC)   T10 vertebral fracture (HCC)   T11 vertebral fracture (HCC)   Ambulatory dysfunction  AKI (acute kidney injury) ([place])   #1 multiple syncopal episodes-from discussing with his wife and patient it sounds like he is having more of hypoglycemic episodes at home.  The day he felt his blood sugar was 41 which was 2 days prior to admission to the hospital.  Then he had a fall a day prior to admission to the hospital when he slid out of bed as soon as he woke up.  They had not checked the blood sugar when he slid out of bed.  In the hospital he has been hypoglycemic on his usual dose of Lantus 45 units.  Lantus has been decreased to 30 units.  So far work-up has not revealed anything else to support syncopal episodes.  Echocardiogram pending.  I will have diabetic coordinator to give him some diabetic education. Check orthostatics.  #2 T10 and T11 fracture neurosurgery has been consulted they recommended T SLO brace which he has it on.  I have restarted aspirin and Plavix as there is no procedure planned. X-ray done with T S and O in place shows known T10-T11 fractures unchanged slight anterior distraction at T10-T11 alignment otherwise is normal. Neurosurgery following.  #3 hypertension on amlodipine, Cardura, hydralazine, and metoprolol. He also takes losartan at home which is on hold due to AKI.  #4 AKI with CKD stage IV continue to hold losartan gentle hydration follow-up labs in a.m. Lasix also being on hold.  #5 type 2 diabetes patient's wife brought in his blood sugar log from home which shows a lot of hypoglycemias in the morning and hyperglycemia at night.  And the day he felt he was hypoglycemic at the blood sugar of 41. I have decreased his Lantus to 15 units from 45 units at bedtime. He continues to have hypoglycemia today even after food his blood sugar was 73 and 81. Prior to that his sugar was in the 30s. We will monitor him closely overnight.  #6 history of PVD and PAD and ongoing tobacco abuse  #7 hyperlipidemia continue statins   Estimated body mass index is 32.53 kg/m as  calculated from the following:   Height as of this encounter: 5\' 7"  (1.702 m).   Weight as of this encounter: 94.2 kg.  DVT prophylaxis: Subcu heparin  code Status full code Family Communication: Discussed with his wife Disposition Plan: Pending improvement in hypoglycemia   Consultants:   None  Procedures: None Antimicrobials: None  Subjective: Patient sitting up in bed eating breakfast his blood sugar was low in the 30s this morning concerned that he is having all these falls from low sugar at home. His blood sugar on the day he fell was 41.  Objective: Vitals:   03/01/19 0857 03/01/19 0901 03/01/19 0904 03/01/19 0905  BP: 116/62 116/62 116/62 116/62  Pulse:  62    Resp:      Temp:      TempSrc:      SpO2:      Weight:      Height:        Intake/Output Summary (Last 24 hours) at 03/01/2019 1133 Last data filed at 03/01/2019 0606 Gross per 24 hour  Intake 1459.58 ml  Output 450 ml  Net 1009.58 ml   Filed Weights   02/28/19 1903  Weight: 94.2 kg    Examination: Patient sitting up in bed with the brace in place General exam: Appears calm and comfortable  Respiratory system: Clear to auscultation. Respiratory effort normal. Cardiovascular system: S1 & S2 heard, RRR. No JVD, murmurs, rubs, gallops or clicks. No pedal edema. Gastrointestinal system: Abdomen is nondistended, soft and nontender. No organomegaly or masses felt. Normal bowel sounds heard. Central nervous system: Alert and oriented. No focal neurological deficits. Extremities: Symmetric 5 x 5 power. Skin: No rashes, lesions or ulcers Psychiatry: Judgement and insight appear normal. Mood & affect appropriate.     Data Reviewed: I have personally reviewed following labs and imaging studies  CBC: Recent Labs  Lab 02/28/19 1149 03/01/19 0443  WBC 4.6 3.6*  NEUTROABS 2.9  --   HGB 10.4* 9.4*  HCT 32.9* 30.6*  MCV 88.2 89.2  PLT 216 [id number]   Basic Metabolic Panel: Recent Labs  Lab  02/28/19 1149 02/28/19 1555  NA 137  --   K 4.5  --   CL 111  --   CO2 20*  --   GLUCOSE 245*  --   BUN 53*  --   CREATININE 3.35*  --   CALCIUM 8.8*  --   MG  --  1.8   GFR: Estimated Creatinine Clearance: 20.2 mL/min (A) (by C-G formula based on SCr of 3.35 mg/dL (H)). Liver Function Tests: Recent Labs  Lab 02/28/19 1149  AST 17  ALT 13  ALKPHOS 89  BILITOT 0.9  PROT 7.2  ALBUMIN 3.5   No results for input(s): LIPASE, AMYLASE in the last 168 hours. No results for input(s): AMMONIA in the last 168 hours. Coagulation Profile: Recent Labs  Lab 03/01/19 0443  INR 1.2   Cardiac Enzymes: No results for input(s): CKTOTAL, CKMB, CKMBINDEX, TROPONINI in the last 168  hours. BNP (last 3 results) No results for input(s): PROBNP in the last 8760 hours. HbA1C: Recent Labs    02/28/19 1924  HGBA1C 9.0*   CBG: Recent Labs  Lab 02/28/19 2057 03/01/19 0755 03/01/19 0825 03/01/19 0842 03/01/19 0901  GLUCAP 152* 38* 58* 66* 73   Lipid Profile: No results for input(s): CHOL, HDL, LDLCALC, TRIG, CHOLHDL, LDLDIRECT in the last 72 hours. Thyroid Function Tests: No results for input(s): TSH, T4TOTAL, FREET4, T3FREE, THYROIDAB in the last 72 hours. Anemia Panel: No results for input(s): VITAMINB12, FOLATE, FERRITIN, TIBC, IRON, RETICCTPCT in the last 72 hours. Sepsis Labs: No results for input(s): PROCALCITON, LATICACIDVEN in the last 168 hours.  Recent Results (from the past 240 hour(s))  SARS CORONAVIRUS 2 (TAT 6-24 HRS) Nasopharyngeal Nasopharyngeal Swab     Status: None   Collection Time: 02/28/19 12:24 PM   Specimen: Nasopharyngeal Swab  Result Value Ref Range Status   SARS Coronavirus 2 NEGATIVE NEGATIVE Final    Comment: (NOTE) SARS-CoV-2 target nucleic acids are NOT DETECTED. The SARS-CoV-2 RNA is generally detectable in upper and lower respiratory specimens during the acute phase of infection. Negative results do not preclude SARS-CoV-2 infection, do not rule  out co-infections with other pathogens, and should not be used as the sole basis for treatment or other patient management decisions. Negative results must be combined with clinical observations, patient history, and epidemiological information. The expected result is Negative. Fact Sheet for Patients: SugarRoll.be Fact Sheet for Healthcare Providers: https://www.[employer].com/ This test is not yet approved or cleared by the [place] FDA and  has been authorized for detection and/or diagnosis of SARS-CoV-2 by FDA under an Emergency Use Authorization (EUA). This EUA will remain  in effect (meaning this test can be used) for the duration of the COVID-19 declaration under Section 56 4(b)(1) of the Act, 21 U.S.C. section 360bbb-3(b)(1), unless the authorization is terminated or revoked sooner. Performed at [facility], [place] [address], [place], [place] [address]          Radiology Studies: Dg Chest 2 View  Result Date: 02/28/2019 CLINICAL DATA:  Fall. EXAM: CHEST - 2 VIEW COMPARISON:  Chest radiograph 12/15/2013 FINDINGS: Heart size within normal limits.  Aortic atherosclerosis. Shallow inspiration radiograph with crowding of the central bronchovascular markings. Suspected mild pulmonary vascular congestion with a small amount of fluid within the minor fissure. No airspace consolidation. No displaced fracture is identified. Thoracic spine degenerative change with multilevel bridging ventral osteophytes. IMPRESSION: Shallow inspiration radiograph. Suspected mild pulmonary vascular congestion with a small amount of fluid within the minor fissure. Aortic atherosclerosis. Electronically Signed   By: Kellie Simmering DO   On: 02/28/2019 14:04   Dg Thoracic Spine 2 View  Result Date: 02/28/2019 CLINICAL DATA:  Multiple falls EXAM: THORACIC SPINE 2 VIEWS COMPARISON:  None. FINDINGS: There is no evidence of thoracic spine fracture. Alignment  is normal. Multilevel degenerative changes are present. No other significant bone abnormalities are identified. IMPRESSION: No compression deformity. Electronically Signed   By: Macy Mis M.D.   On: 02/28/2019 14:05   Dg Lumbar Spine Complete  Result Date: 02/28/2019 CLINICAL DATA:  Multiple falls EXAM: LUMBAR SPINE - COMPLETE 4+ VIEW COMPARISON:  November 01, 2018 FINDINGS: Stable vertebral body heights and alignment. No acute fracture identified. Multilevel degenerative changes with disc height loss, endplate osteophytes, and facet hypertrophy again noted. Partially imaged right total hip arthroplasty. IMPRESSION: No new finding.  Multilevel degenerative changes. Electronically Signed   By: Malachi Carl  Patel M.D.   On: 02/28/2019 14:03   Ct Head Wo Contrast  Result Date: 02/28/2019 CLINICAL DATA:  78 year old male with history of recurrent syncope. Fall today. Back pain. EXAM: CT HEAD WITHOUT CONTRAST CT CERVICAL SPINE WITHOUT CONTRAST TECHNIQUE: Multidetector CT imaging of the head and cervical spine was performed following the standard protocol without intravenous contrast. Multiplanar CT image reconstructions of the cervical spine were also generated. COMPARISON:  None. FINDINGS: CT HEAD FINDINGS Brain: Mild cerebral atrophy. Patchy and confluent areas of decreased attenuation are noted throughout the deep and periventricular white matter of the cerebral hemispheres bilaterally, compatible with chronic microvascular ischemic disease. Well-defined areas of low attenuation in the left cerebellar hemisphere related to remote left cerebellar infarct. No evidence of acute infarction, hemorrhage, hydrocephalus, extra-axial collection or mass lesion/mass effect. Vascular: No hyperdense vessel or unexpected calcification. Skull: Normal. Negative for fracture or focal lesion. Sinuses/Orbits: No acute finding. Other: None. CT CERVICAL SPINE FINDINGS Alignment: Mild reversal of normal cervical lordosis centered at  the level of C4, likely chronic and related to degenerative disease. Skull base and vertebrae: No acute fracture. No primary bone lesion or focal pathologic process. Soft tissues and spinal canal: No prevertebral fluid or swelling. No visible canal hematoma. Disc levels: Severe multilevel degenerative disc disease, most pronounced at C3-C4, C4-C5, C5-C6 and C6-C7. Severe multilevel facet arthropathy. Upper chest: Unremarkable. Other: There are no aggressive appearing lytic or blastic lesions noted in the visualized portions of the skeleton. IMPRESSION: 1. No evidence of significant acute traumatic injury to the skull, brain or cervical spine. 2. 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 3. 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 Electronically Signed   By: Vinnie Langton M.D.   On: 02/28/2019 14:18   Ct Cervical Spine Wo Contrast  Result Date: 02/28/2019 CLINICAL DATA:  78 year old male with history of recurrent syncope. Fall today. Back pain. EXAM: CT HEAD WITHOUT CONTRAST CT CERVICAL SPINE WITHOUT CONTRAST TECHNIQUE: Multidetector CT imaging of the head and cervical spine was performed following the standard protocol without intravenous contrast. Multiplanar CT image reconstructions of the cervical spine were also generated. COMPARISON:  None. FINDINGS: CT HEAD FINDINGS Brain: Mild cerebral atrophy. Patchy and confluent areas of decreased attenuation are noted throughout the deep and periventricular white matter of the cerebral hemispheres bilaterally, compatible with chronic microvascular ischemic disease. Well-defined areas of low attenuation in the left cerebellar hemisphere related to remote left cerebellar infarct. No evidence of acute infarction, hemorrhage, hydrocephalus, extra-axial collection or mass lesion/mass effect. Vascular: No hyperdense  vessel or unexpected calcification. Skull: Normal. Negative for fracture or focal lesion. Sinuses/Orbits: No acute finding. Other: None. CT CERVICAL SPINE FINDINGS Alignment: Mild reversal of normal cervical lordosis centered at the level of C4, likely chronic and related to degenerative disease. Skull base and vertebrae: No acute fracture. No primary bone lesion or focal pathologic process. Soft tissues and spinal canal: No prevertebral fluid or swelling. No visible canal hematoma. Disc levels: Severe multilevel degenerative disc disease, most pronounced at C3-C4, C4-C5, C5-C6 and C6-C7. Severe multilevel facet arthropathy. Upper chest: Unremarkable. Other: There are no aggressive appearing lytic or blastic lesions noted in the visualized portions of the skeleton. IMPRESSION: 1. No evidence of significant acute traumatic injury to the skull, brain or cervical spine. 2. Mild cerebral atrophy with chronic microvascular ischemic changes in the cerebral white matter and sequela of old left  cerebellar infarct, as detailed above. 3. 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 Electronically Signed   By: Vinnie Langton M.D.   On: 02/28/2019 14:18   Ct Thoracic Spine Wo Contrast  Result Date: 02/28/2019 CLINICAL DATA:  Severe back pain and back brace gin secondary to a fall today. Multiple recent falls. EXAM: CT THORACIC SPINE WITHOUT CONTRAST TECHNIQUE: Multidetector CT images of the thoracic were obtained using the standard protocol without intravenous contrast. COMPARISON:  Radiographs dated 02/28/2019 FINDINGS: Alignment: Normal. Vertebrae: There is an acute oblique fracture through the T10 and T11 vertebral bodies. The fracture involves primarily the T10 vertebral body that extends into the right anterior superior aspect of the superior endplate T11 fracturing through anterior and right lateral osteophytes that have previously fused the T10 and T11 levels. The fracture does  not appear to extend into the pedicles or posterior elements at either level there is approximately 3 mm of distraction at the anterior aspect of vertebral bodies. No visible hemorrhage into the spinal canal at the site of the fracture or elsewhere in the thoracic spine. The remainder of the thoracic spine is intact. The spine appears to be fused posteriorly from T2 through T7 and spine is fused anteriorly from at least T4 through T9-10 and was previously fused at T10-11 prior to this fracture the T11-12 level does not appear to be fused. There is multilevel foraminal stenosis in the mid upper thoracic spine due to the fusions. No spinal stenosis. Paraspinal and other soft tissues: Aortic atherosclerosis. No evidence of a paraspinal hematoma at the site of the fracture. Disc levels: Diffuse disc space narrowing with fusion of much of the thoracic spine as described above. No discrete disc protrusions. IMPRESSION: 1. Acute oblique fracture of the T10 and T11 vertebral bodies as described above. The fracture does not appear to extend into the pedicles or posterior elements at either level. The majority of the thoracic spine is fused as described above. 2. The remainder of the thoracic spine is intact. 3. No evidence of epidural hematoma or other discrete severe encroachment upon the spinal canal. 4. Aortic Atherosclerosis (ICD10-I70.0). Electronically Signed   By: Lorriane Shire M.D.   On: 02/28/2019 16:07        Scheduled Meds:  amLODipine  10 mg Oral Daily   aspirin EC  81 mg Oral Daily   clopidogrel  75 mg Oral Daily   docusate sodium  100 mg Oral BID   doxazosin  4 mg Oral Daily   heparin injection (subcutaneous)  5,000 Units Subcutaneous Q8H   hydrALAZINE  50 mg Oral BID   insulin aspart  0-5 Units Subcutaneous QHS   insulin aspart  0-6 Units Subcutaneous TID WC   insulin aspart  10 Units Subcutaneous TID WC   insulin glargine  30 Units Subcutaneous QHS   loratadine  10 mg Oral  Daily   metoprolol succinate  100 mg Oral Daily   nicotine  21 mg Transdermal Q24H   pantoprazole  40 mg Oral Daily   pregabalin  75 mg Oral BID   simvastatin  20 mg Oral QPM   Continuous Infusions:  sodium chloride 75 mL/hr at 02/28/19 2000     LOS: 1 day     Georgette Shell, MD [facility]  If 7PM-7AM, please contact night-coverage [email or web address] Password [facility] 03/01/2019, 11:33 AM

## 2019-03-01 NOTE — Evaluation (Signed)
Physical Therapy Evaluation Patient Details Name: Zachary Hoffman MRN: [id number] DOB: 1940/11/19 Today's Date: 03/01/2019   History of Present Illness  78 y.o. male with past medical history of diastolic CHF, CKD stage IV, diabetes mellitus on insulin, hyperlipidemia, hypertension, tonic low backache, coronary artery disease status post stent, history of peripheral vascular disease, presented to hospital with complaints of episodes of passing out.CT scan of his cervical thoracic and lumbar spine.  Degenerative joint changes were noted and in the C-spine there was mention of T10 and T11 oblique fracture.  neurosurgery recommended TLSO  Clinical Impression  Pt admitted with above diagnosis.  PT able to amb short distance in room, limited by pain. Called RN for meds and alerted secretary as pt pain up to 8/10. Pt was willing to mobilize within limits. Pt will need HHPT, RW and 3in1. Will continue to follow in acute setting Pt fatigued and with incr WOB however VSS with sats 90s, HR 70s  Pt currently with functional limitations due to the deficits listed below (see PT Problem List). Pt will benefit from skilled PT to increase their independence and safety with mobility to allow discharge to the venue listed below.       Follow Up Recommendations Home health PT;Supervision for mobility/OOB    Equipment Recommendations  Rolling walker with 5" wheels;3in1 (PT)    Recommendations for Other Services       Precautions / Restrictions Precautions Precautions: Fall;Back Required Braces or Orthoses: Spinal Brace Spinal Brace: Thoracolumbosacral orthotic Restrictions Weight Bearing Restrictions: No      Mobility  Bed Mobility Overal bed mobility: Needs Assistance Bed Mobility: Rolling;Sidelying to Sit;Sit to Sidelying Rolling: Min assist Sidelying to sit: Min assist     Sit to sidelying: Min assist;Mod assist General bed mobility comments: cues to log roll, assist to elevate trunk to  sitting and bring LEs onto bed and return to supine position  Transfers Overall transfer level: Needs assistance Equipment used: Rolling walker (2 wheeled) Transfers: Sit to/from Stand Sit to Stand: Min assist;From elevated surface         General transfer comment: cues to use LEs to power up  Ambulation/Gait Ambulation/Gait assistance: Min assist Gait Distance (Feet): 20 Feet(in room) Assistive device: Rolling walker (2 wheeled) Gait Pattern/deviations: Step-through pattern;Decreased stride length;Wide base of support Gait velocity: decr   General Gait Details: unsteady initially but no overt LOB, limited by pain and fatigue  Stairs            Wheelchair Mobility    Modified Rankin (Stroke Patients Only)       Balance Overall balance assessment: Needs assistance Sitting-balance support: Feet supported;Bilateral upper extremity supported Sitting balance-Leahy Scale: Fair     Standing balance support: Bilateral upper extremity supported Standing balance-Leahy Scale: Poor Standing balance comment: reliant on UEs                             Pertinent Vitals/Pain Pain Assessment: 0-10 Pain Score: 8  Pain Location: back Pain Descriptors / Indicators: Aching;Grimacing;Sore;Spasm Pain Intervention(s): Limited activity within patient's tolerance;Monitored during session;Repositioned;Patient requesting pain meds-RN notified    Home Living Family/patient expects to be discharged to:: Private residence Living Arrangements: Spouse/significant other Available Help at Discharge: Family;Available 24 hours/day Type of Home: House Home Access: Stairs to enter   [employer] of Steps: 1 Home Layout: Two level Home Equipment: Cane - single point      Prior Function Level of Independence:  Independent;Independent with assistive device(s)         Comments: amb with cane prior to admission, wife reports she cannot do any lifting     Hand  Dominance        Extremity/Trunk Assessment   Upper Extremity Assessment Upper Extremity Assessment: Overall WFL for tasks assessed    Lower Extremity Assessment Lower Extremity Assessment: Overall WFL for tasks assessed       Communication   Communication: No difficulties  Cognition Arousal/Alertness: Awake/alert Behavior During Therapy: WFL for tasks assessed/performed Overall Cognitive Status: Within Functional Limits for tasks assessed                                        General Comments      Exercises     Assessment/Plan    PT Assessment Patient needs continued PT services  PT Problem List Decreased strength;Decreased mobility;Decreased activity tolerance;Pain;Decreased knowledge of use of DME;Decreased balance       PT Treatment Interventions DME instruction;Therapeutic exercise;Gait training;Stair training;Functional mobility training;Therapeutic activities;Patient/family education;Balance training    PT Goals (Current goals can be found in the Care Plan section)  Acute Rehab PT Goals PT Goal Formulation: With patient Time For Goal Achievement: 03/14/19 Potential to Achieve Goals: Good    Frequency Min 3X/week   Barriers to discharge        Co-evaluation               AM-PAC PT "6 Clicks" Mobility  Outcome Measure Help needed turning from your back to your side while in a flat bed without using bedrails?: A Little Help needed moving from lying on your back to sitting on the side of a flat bed without using bedrails?: A Lot Help needed moving to and from a bed to a chair (including a wheelchair)?: A Little Help needed standing up from a chair using your arms (e.g., wheelchair or bedside chair)?: A Little Help needed to walk in hospital room?: A Little Help needed climbing 3-5 steps with a railing? : A Lot 6 Click Score: 16    End of Session Equipment Utilized During Treatment: Gait belt Activity Tolerance: Patient tolerated  treatment well Patient left: in bed;with bed alarm set;with call bell/phone within reach;with family/visitor present(bed rails x4 per pt request)   PT Visit Diagnosis: Difficulty in walking, not elsewhere classified (R26.2);Pain Pain - part of body: (back)    Time: [id number] PT Time Calculation (min) (ACUTE ONLY): 27 min   Charges:   PT Evaluation $PT Eval Low Complexity: 1 Low PT Treatments $Gait Training: 8-22 mins        Kenyon Ana, PT  Pager: [phone] Acute Rehab Dept [facility]): [phone]   03/01/2019   [facility] 03/01/2019, 3:10 PM

## 2019-03-01 NOTE — Progress Notes (Addendum)
Inpatient Diabetes Program Recommendations  AACE/ADA: New Consensus Statement on Inpatient Glycemic Control (2015)  Target Ranges:  Prepandial:   less than 140 mg/dL      Peak postprandial:   less than 180 mg/dL (1-2 hours)      Critically ill patients:  140 - 180 mg/dL   Lab Results  Component Value Date   GLUCAP 73 03/01/2019   HGBA1C 9.0 (H) 02/28/2019    Review of Glycemic Control Results for KURK, SNAY (MRN [id number]) as of 03/01/2019 10:11  Ref. Range 02/28/2019 12:18 02/28/2019 20:57 03/01/2019 07:55 03/01/2019 08:25 03/01/2019 08:42 03/01/2019 09:01  Glucose-Capillary Latest Ref Range: 70 - 99 mg/dL 232 (H) 152 (H) 38 (LL) 58 (L) 66 (L) 73    Diabetes history: DM 2 Outpatient Diabetes medications: Lantus 45 units Daily, Apidra 10 units tid with meals Current orders for Inpatient glycemic control:  Lantus 45 units qhs Novolog 0-6 units tid + hs Novolog 10 units tid  BUN/Creat: 53/3.35  Inpatient Diabetes Program Recommendations:    Hypoglycemia this am in the 30's.   Decrease Lantus to 30 units  Spoke with pt and wife at bedside regarding glucose trends at home. Wife had home glucose record at bedside. Pt had almost half of the fasting glucose below 70. The second glucose check was majority at bedtime and mostly over 200.  It seems pt needs less basal and be placed on a sliding scale. Pt said he had been on a set meal coverage but had hypoglycemia. Pt would sometimes only eat supper.  Discussed importance of regular meal intake. Also discussed treatment for hypoglycemia. Will follow glucose trends while here to evaluate a safer regimen for home.  Thanks,  Tama Headings RN, MSN, BC-ADM Inpatient Diabetes Coordinator Team Pager [phone] (8a-5p)

## 2019-03-02 ENCOUNTER — Inpatient Hospital Stay (HOSPITAL_COMMUNITY): Payer: Medicare Other

## 2019-03-02 DIAGNOSIS — R55 Syncope and collapse: Secondary | ICD-10-CM

## 2019-03-02 LAB — GLUCOSE, CAPILLARY
Glucose-Capillary: 195 mg/dL — ABNORMAL HIGH (ref 70–99)
Glucose-Capillary: 162 mg/dL — ABNORMAL HIGH (ref 70–99)
Glucose-Capillary: 136 mg/dL — ABNORMAL HIGH (ref 70–99)

## 2019-03-02 LAB — BASIC METABOLIC PANEL
Anion gap: 7 (ref 5–15)
BUN: 46 mg/dL — ABNORMAL HIGH (ref 8–23)
CO2: 18 mmol/L — ABNORMAL LOW (ref 22–32)
Calcium: 8.7 mg/dL — ABNORMAL LOW (ref 8.9–10.3)
Chloride: 115 mmol/L — ABNORMAL HIGH (ref 98–111)
Creatinine, Ser: 3.03 mg/dL — ABNORMAL HIGH (ref 0.61–1.24)
GFR calc Af Amer: 22 mL/min — ABNORMAL LOW (ref >60)
GFR calc non Af Amer: 19 mL/min — ABNORMAL LOW (ref >60)
Glucose, Bld: 189 mg/dL — ABNORMAL HIGH (ref 70–99)
Potassium: 4.7 mmol/L (ref 3.5–5.1)
Sodium: 140 mmol/L (ref 135–145)

## 2019-03-02 LAB — ECHOCARDIOGRAM COMPLETE
Height: 67 in
Weight: 3432.12 oz

## 2019-03-02 MED ORDER — OXYCODONE HCL 5 MG PO TABS: 5.0000 mg | ORAL_TABLET | ORAL | 0 refills | Status: AC | PRN

## 2019-03-02 MED ORDER — NICOTINE 21 MG/24HR TD PT24: 21.0000 mg | MEDICATED_PATCH | TRANSDERMAL | 0 refills | Status: DC

## 2019-03-02 MED ORDER — POLYETHYLENE GLYCOL 3350 17 G PO PACK: 17.0000 g | PACK | Freq: Every day | ORAL | 0 refills | Status: AC | PRN

## 2019-03-02 MED ORDER — APIDRA 100 UNIT/ML IJ SOLN: 5.0000 [IU] | Freq: Three times a day (TID) | INTRAMUSCULAR | 11 refills | Status: DC

## 2019-03-02 MED ORDER — DOCUSATE SODIUM 100 MG PO CAPS: 100.0000 mg | ORAL_CAPSULE | Freq: Two times a day (BID) | ORAL | 0 refills | Status: AC

## 2019-03-02 MED ORDER — INSULIN GLARGINE 100 UNIT/ML ~~LOC~~ SOLN: SUBCUTANEOUS | 3 refills | Status: AC

## 2019-03-02 NOTE — Progress Notes (Signed)
Subjective: Zachary Hoffman presents with diabetes, diabetic neuropathy and cc of painful, discolored, thick toenails and painful callus/corn which interfere with activities of daily living. Pain is aggravated when wearing enclosed shoe gear. Pain is relieved with periodic professional debridement.  He voices no new pedal problems on today's visit.  [name], MD is his PCP.   Medications reviewed in chart.  No Known Allergies   Objective: There were no vitals filed for this visit.  Vascular Examination: Capillary refill time <3 seconds b/l.  Dorsalis pedis pulses faintly palpable b/l.  Posterior tibial pulses faintly palpable b/l.  Digital hair absent b/l.  Skin temperature gradient WNL b/l.  Dermatological Examination: Skin with normal turgor, texture and tone b/l.  Toenails 1-5 b/l discolored, thick, dystrophic with subungual debris and pain with palpation to nailbeds due to thickness of nails.  Hyperkeratotic lesions b/l heels, submet head 5 b/l, plantarlateral right foot. No erythema, no edema, no drainage, no flocculence noted.   Musculoskeletal: Muscle strength 5/5 to all LE muscle groups.  No pain, crepitus or joint limitation with passive/active ROM.  Neurological: Sensation diminished with 10 gram monofilament bilaterally.  Assessment: 1. Painful onychomycosis toenails 1-5 b/l 2. Calluses b/l heels, submet head 5 b/l, plantarlateral right foot 3. NIDDM with Diabetic neuropathy  Plan: 1. No new findings. No new orders on today's visit. 2. Continue diabetic foot care principles. Literature dispensed on today. 3. Toenails 1-5 b/l were debrided in length and girth without iatrogenic bleeding. 4. Calluses pared b/l heels, submet head 5 b/l, plantarlateral right foot utilizing sterile scalpel blade without incident.  5. Patient to continue soft, supportive shoe gear. 6. Patient to report any pedal injuries to medical professional. 7. Follow up 3 months.   8. Patient/POA to call should there be a concern in the interim.

## 2019-03-02 NOTE — TOC Progression Note (Signed)
Transition of Care [facility]) - Progression Note    Patient Details  Name: Zachary Hoffman MRN: [id number] Date of Birth: December 25, 1940  Transition of [facility]) CM/SW Contact  Purcell Mouton, RN Phone Number: 03/02/2019, 11:29 AM  Clinical Narrative:    Pt will discharged home with [facility].    Expected Discharge Plan: [place]    Expected Discharge Plan and Services Expected Discharge Plan: [place]   Discharge Planning Services: CM Consult Post Acute Care Choice: [place] arrangements for the past 2 months: Single Family Home Expected Discharge Date: 03/02/19               DME Arranged: 3-N-1, Walker rolling with seat, Shower stool DME Agency: [employer] Date DME Agency Contacted: 03/02/19 Time DME Agency Contacted: 32 Representative spoke with at DME Agency: [place]: PT [place] Date [place]: 03/02/19 Time [place]: 1125 Representative spoke with at [place]: Cassie   Social Determinants of Health ([place]) Interventions    Readmission Risk Interventions No flowsheet data found.

## 2019-03-02 NOTE — Care Management Important Message (Signed)
Important Message  Patient Details IM Letter given to Cookie McGibboney RN to present to the Patient Name: Zachary Hoffman MRN: [id number] Date of Birth: 04-Oct-1940   Medicare Important Message Given:  Yes     Kerin Salen 03/02/2019, 11:32 AM

## 2019-03-02 NOTE — Discharge Summary (Signed)
Physician Discharge Summary  Zachary Hoffman [id number] DOB: Feb 13, 1941 DOA: 02/28/2019  PCP: [name], MD  Admit date: 02/28/2019 Discharge date: 03/02/2019  Admitted From: Home Disposition: Home  Recommendations for Outpatient Follow-up:  1. Follow up with PCP in 1-2 weeks 2. Please obtain BMP/CBC in one week 3. Please follow up with PCP 4. PCP please note that I have not put him back on Cozaar due to normal blood pressure and increased creatinine more than baseline please restart if needed as an outpatient  Home Health yes  equipment/Devices none Discharge Condition: Stable and improved CODE STATUS: Full code  diet recommendation: Cardiac diet Brief/Interim Summary::78 y.o.malewith past medical history of diastolic CHF,CKD stage IV, diabetes mellitus on insulin, hyperlipidemia, hypertension, tonic low backache, coronary artery disease status post stent, history of peripheral vascular disease, presented to hospital with complaints of passing out spells for the last few days. Patient had at least 2 episodes of syncope first onewas preceded by dizziness the second episodewas without any warning signs. Patient fell on the back of his head. After his fall,he started having severe back pain to the point where he was unable to ambulate much. Patient denies any chest pain, palpitation prior to the fall. Denies any fever, chills or rigor. Denied urinary urgency, frequency or dysuria. Denied nausea, vomiting or diarrhea. Denies recent travel or sick contact. Patient does have history of chronic kidney disease and follows up with nephrology Dr. Carolin Sicks as outpatient. Patientwas recently seen by cardiology on 02/16/2019 for a follow-upvisit.  ED Course:In the ED, patient complained of severe thoracic pain with difficulty changing in positions. Patient underwent x-ray of his thoracic and lumbar spine followed by CT scan of his cervical thoracic and lumbar spine.  Degenerative joint changes were noted and in the C-spine there was mention of T10 and T11 oblique fracture.Patient was then considered for admission to the hospital for vertebral fractures,ambulatory dysfunction,intractable pain in acute kidney injury.    Discharge Diagnoses:  Principal Problem:   Intractable back pain Active Problems:   Hypertension   DM2 (diabetes mellitus, type 2) (HCC)   Hyperlipidemia with target LDL less than 70   CKD (chronic kidney disease), stage IV (HCC)   T10 vertebral fracture (HCC)   T11 vertebral fracture (HCC)   Ambulatory dysfunction   AKI (acute kidney injury) ([facility])  #1 multiple syncopal episodes-from discussing with his wife and patient it sounds like he is having more of hypoglycemic episodes at home.  The day he felt his blood sugar was 41 which was 2 days prior to admission to the hospital.  Then he had a fall a day prior to admission to the hospital when he slid out of bed as soon as he woke up.  They had not checked the blood sugar when he slid out of bed.  In the hospital he has been hypoglycemic on his usual dose of Lantus 45 units.  Lantus has been decreased to15 units in the hospital.  So far work-up has not revealed anything else to support syncopal episodes.  His blood sugar was 165 in the morning on the day of discharge on Lantus 15 units nightly.  I will discharge him on Lantus 15 units nightly with APR IDA 5 units 3 times a day before meals.  He will follow-up with his PCP in 1 to 2 weeks with the blood sugars written down.   #2 T10 and T11 fracture neurosurgery has been consulted they recommended T SLO brace which he has it on.  I have restarted aspirin and Plavix as there is no procedure planned. X-ray done with T S and O in place shows known T10-T11 fractures unchanged slight anterior distraction at T10-T11 alignment otherwise is normal. Neurosurgery has seen the patient.  #3 hypertension on amlodipine, Cardura, hydralazine, and  metoprolol. He also takes losartan at home which is on hold due to AKI.  #4 AKI with CKD stage IV continue to hold losartan he was given gentle hydration follow-up labs in 1 week.  Lasix was restarted.    #5 type 2 diabetes patient's wife brought in his blood sugar log from home which shows a lot of hypoglycemias in the morning and hyperglycemia at night. And the day he felt he was hypoglycemic at the blood sugar of 41. I have decreased his Lantus to 15 units from 45 units at bedtime. He had hypoglycemia in the hospital.  His blood sugar even after food his blood sugar was 73 and 81. Prior to that his sugar was in the 30s.  Discharging on Lantus 15 units nightly with short-acting insulin 5 units 3 times a day before meals.    #6 history of PVD and PAD and ongoing tobacco abuse counseled  #7 hyperlipidemia continue statins   Estimated body mass index is 33.6 kg/m as calculated from the following:   Height as of this encounter: 5\' 7"  (1.702 m).   Weight as of this encounter: 97.3 kg.  Discharge Instructions  Discharge Instructions    Call MD for:  difficulty breathing, headache or visual disturbances   Complete by: As directed    Call MD for:  severe uncontrolled pain   Complete by: As directed    Diet - low sodium heart healthy   Complete by: As directed    For home use only DME 4 wheeled rolling walker with seat   Complete by: As directed    Patient needs a walker to treat with the following condition: Unsteady gait   Increase activity slowly   Complete by: As directed      Allergies as of 03/02/2019   No Known Allergies     Medication List    STOP taking these medications   Lantus SoloStar 100 UNIT/ML Solostar Pen Generic drug: Insulin Glargine Replaced by: insulin glargine 100 UNIT/ML injection   losartan 50 MG tablet Commonly known as: COZAAR   pregabalin 150 MG capsule Commonly known as: LYRICA     TAKE these medications   amLODipine 10 MG tablet Commonly  known as: NORVASC Take 10 mg by mouth daily.   Apidra 100 UNIT/ML injection Generic drug: insulin glulisine Inject 0.05 mLs (5 Units total) into the skin 3 (three) times daily before meals. What changed:   how much to take  when to take this  additional instructions   aspirin EC 81 MG tablet Take 81 mg by mouth daily.   cetirizine 10 MG tablet Commonly known as: ZYRTEC Take 10 mg by mouth daily as needed for allergies.   clopidogrel 75 MG tablet Commonly known as: PLAVIX Take 75 mg by mouth daily.   docusate sodium 100 MG capsule Commonly known as: COLACE Take 1 capsule (100 mg total) by mouth 2 (two) times daily.   doxazosin 4 MG tablet Commonly known as: CARDURA Take 4 mg by mouth daily.   fluticasone 50 MCG/ACT nasal spray Commonly known as: FLONASE Place 2 sprays into both nostrils daily as needed for allergies.   furosemide 40 MG tablet Commonly known as: LASIX Take 40  mg by mouth.   hydrALAZINE 50 MG tablet Commonly known as: APRESOLINE Take 50 mg by mouth 2 (two) times daily.   insulin glargine 100 UNIT/ML injection Commonly known as: LANTUS Take 15 units nightly Replaces: Lantus SoloStar 100 UNIT/ML Solostar Pen   metoprolol succinate 100 MG 24 hr tablet Commonly known as: TOPROL-XL Take 100 mg by mouth daily. Take with or immediately following a meal.   Mitigare 0.6 MG Caps Generic drug: Colchicine Take 0.6 mg by mouth daily as needed (gout attacks).   nicotine 21 mg/24hr patch Commonly known as: NICODERM CQ - dosed in mg/24 hours Place 1 patch (21 mg total) onto the skin daily.   oxyCODONE 5 MG immediate release tablet Commonly known as: Oxy IR/ROXICODONE Take 1 tablet (5 mg total) by mouth every 4 (four) hours as needed for moderate pain.   pantoprazole 40 MG tablet Commonly known as: PROTONIX Take 40 mg by mouth daily. Reported on 06/29/2015   polyethylene glycol 17 g packet Commonly known as: MIRALAX / GLYCOLAX Take 17 g by mouth  daily as needed for mild constipation.   Prodigy No Coding Blood Gluc test strip Generic drug: glucose blood U TID   simvastatin 20 MG tablet Commonly known as: ZOCOR Take 20 mg by mouth every evening.   tiZANidine 4 MG tablet Commonly known as: ZANAFLEX Take 4 mg by mouth 2 (two) times daily as needed for muscle spasms.            Durable Medical Equipment  (From admission, onward)         Start     Ordered   03/02/19 0000  For home use only DME 4 wheeled rolling walker with seat    Question:  Patient needs a walker to treat with the following condition  Answer:  Unsteady gait   03/02/19 1040          No Known Allergies  Consultations:  Neurosurgery   Procedures/Studies: Dg Chest 2 View  Result Date: 02/28/2019 CLINICAL DATA:  Fall. EXAM: CHEST - 2 VIEW COMPARISON:  Chest radiograph 12/15/2013 FINDINGS: Heart size within normal limits.  Aortic atherosclerosis. Shallow inspiration radiograph with crowding of the central bronchovascular markings. Suspected mild pulmonary vascular congestion with a small amount of fluid within the minor fissure. No airspace consolidation. No displaced fracture is identified. Thoracic spine degenerative change with multilevel bridging ventral osteophytes. IMPRESSION: Shallow inspiration radiograph. Suspected mild pulmonary vascular congestion with a small amount of fluid within the minor fissure. Aortic atherosclerosis. Electronically Signed   By: Kellie Simmering DO   On: 02/28/2019 14:04   Dg Thoracic Spine 2 View  Result Date: 03/01/2019 CLINICAL DATA:  T10 and T11 fractures status post TLSO brace. EXAM: THORACIC SPINE 2 VIEWS COMPARISON:  CT thoracic spine and thoracic spine x-rays from yesterday. FINDINGS: The known fractures involving the T10 and T11 vertebral bodies are not well evaluated by x-ray. Slight anterior distraction at T10-T11 is unchanged. Alignment is otherwise normal. IMPRESSION: 1. Known T10-T11 fractures are not well  evaluated by x-ray. 2. Unchanged slight anterior distraction at T10-T11. Alignment is otherwise normal. Electronically Signed   By: Titus Dubin M.D.   On: 03/01/2019 14:31   Dg Thoracic Spine 2 View  Result Date: 02/28/2019 CLINICAL DATA:  Multiple falls EXAM: THORACIC SPINE 2 VIEWS COMPARISON:  None. FINDINGS: There is no evidence of thoracic spine fracture. Alignment is normal. Multilevel degenerative changes are present. No other significant bone abnormalities are identified. IMPRESSION: No compression deformity. Electronically  Signed   By: Macy Mis M.D.   On: 02/28/2019 14:05   Dg Lumbar Spine Complete  Result Date: 02/28/2019 CLINICAL DATA:  Multiple falls EXAM: LUMBAR SPINE - COMPLETE 4+ VIEW COMPARISON:  November 01, 2018 FINDINGS: Stable vertebral body heights and alignment. No acute fracture identified. Multilevel degenerative changes with disc height loss, endplate osteophytes, and facet hypertrophy again noted. Partially imaged right total hip arthroplasty. IMPRESSION: No new finding.  Multilevel degenerative changes. Electronically Signed   By: Macy Mis M.D.   On: 02/28/2019 14:03   Ct Head Wo Contrast  Result Date: 02/28/2019 CLINICAL DATA:  78 year old male with history of recurrent syncope. Fall today. Back pain. EXAM: CT HEAD WITHOUT CONTRAST CT CERVICAL SPINE WITHOUT CONTRAST TECHNIQUE: Multidetector CT imaging of the head and cervical spine was performed following the standard protocol without intravenous contrast. Multiplanar CT image reconstructions of the cervical spine were also generated. COMPARISON:  None. FINDINGS: CT HEAD FINDINGS Brain: Mild cerebral atrophy. Patchy and confluent areas of decreased attenuation are noted throughout the deep and periventricular white matter of the cerebral hemispheres bilaterally, compatible with chronic microvascular ischemic disease. Well-defined areas of low attenuation in the left cerebellar hemisphere related to remote left  cerebellar infarct. No evidence of acute infarction, hemorrhage, hydrocephalus, extra-axial collection or mass lesion/mass effect. Vascular: No hyperdense vessel or unexpected calcification. Skull: Normal. Negative for fracture or focal lesion. Sinuses/Orbits: No acute finding. Other: None. CT CERVICAL SPINE FINDINGS Alignment: Mild reversal of normal cervical lordosis centered at the level of C4, likely chronic and related to degenerative disease. Skull base and vertebrae: No acute fracture. No primary bone lesion or focal pathologic process. Soft tissues and spinal canal: No prevertebral fluid or swelling. No visible canal hematoma. Disc levels: Severe multilevel degenerative disc disease, most pronounced at C3-C4, C4-C5, C5-C6 and C6-C7. Severe multilevel facet arthropathy. Upper chest: Unremarkable. Other: There are no aggressive appearing lytic or blastic lesions noted in the visualized portions of the skeleton. IMPRESSION: 1. No evidence of significant acute traumatic injury to the skull, brain or cervical spine. 2. 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 3. 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 Electronically Signed   By: Vinnie Langton M.D.   On: 02/28/2019 14:18   Ct Cervical Spine Wo Contrast  Result Date: 02/28/2019 CLINICAL DATA:  78 year old male with history of recurrent syncope. Fall today. Back pain. EXAM: CT HEAD WITHOUT CONTRAST CT CERVICAL SPINE WITHOUT CONTRAST TECHNIQUE: Multidetector CT imaging of the head and cervical spine was performed following the standard protocol without intravenous contrast. Multiplanar CT image reconstructions of the cervical spine were also generated. COMPARISON:  None. FINDINGS: CT HEAD FINDINGS Brain: Mild cerebral atrophy. Patchy and confluent areas of decreased attenuation are noted throughout the  deep and periventricular white matter of the cerebral hemispheres bilaterally, compatible with chronic microvascular ischemic disease. Well-defined areas of low attenuation in the left cerebellar hemisphere related to remote left cerebellar infarct. No evidence of acute infarction, hemorrhage, hydrocephalus, extra-axial collection or mass lesion/mass effect. Vascular: No hyperdense vessel or unexpected calcification. Skull: Normal. Negative for fracture or focal lesion. Sinuses/Orbits: No acute finding. Other: None. CT CERVICAL SPINE FINDINGS Alignment: Mild reversal of normal cervical lordosis centered at the level of C4, likely chronic and related to degenerative disease. Skull base and vertebrae: No acute fracture. No primary bone lesion or focal pathologic process. Soft tissues and  spinal canal: No prevertebral fluid or swelling. No visible canal hematoma. Disc levels: Severe multilevel degenerative disc disease, most pronounced at C3-C4, C4-C5, C5-C6 and C6-C7. Severe multilevel facet arthropathy. Upper chest: Unremarkable. Other: There are no aggressive appearing lytic or blastic lesions noted in the visualized portions of the skeleton. IMPRESSION: 1. No evidence of significant acute traumatic injury to the skull, brain or cervical spine. 2. 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 3. 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 Electronically Signed   By: Vinnie Langton M.D.   On: 02/28/2019 14:18   Ct Thoracic Spine Wo Contrast  Result Date: 02/28/2019 CLINICAL DATA:  Severe back pain and back brace gin secondary to a fall today. Multiple recent falls. EXAM: CT THORACIC SPINE WITHOUT CONTRAST TECHNIQUE: Multidetector CT images of the thoracic were obtained using the standard protocol without intravenous contrast. COMPARISON:  Radiographs dated  02/28/2019 FINDINGS: Alignment: Normal. Vertebrae: There is an acute oblique fracture through the T10 and T11 vertebral bodies. The fracture involves primarily the T10 vertebral body that extends into the right anterior superior aspect of the superior endplate T11 fracturing through anterior and right lateral osteophytes that have previously fused the T10 and T11 levels. The fracture does not appear to extend into the pedicles or posterior elements at either level there is approximately 3 mm of distraction at the anterior aspect of vertebral bodies. No visible hemorrhage into the spinal canal at the site of the fracture or elsewhere in the thoracic spine. The remainder of the thoracic spine is intact. The spine appears to be fused posteriorly from T2 through T7 and spine is fused anteriorly from at least T4 through T9-10 and was previously fused at T10-11 prior to this fracture the T11-12 level does not appear to be fused. There is multilevel foraminal stenosis in the mid upper thoracic spine due to the fusions. No spinal stenosis. Paraspinal and other soft tissues: Aortic atherosclerosis. No evidence of a paraspinal hematoma at the site of the fracture. Disc levels: Diffuse disc space narrowing with fusion of much of the thoracic spine as described above. No discrete disc protrusions. IMPRESSION: 1. Acute oblique fracture of the T10 and T11 vertebral bodies as described above. The fracture does not appear to extend into the pedicles or posterior elements at either level. The majority of the thoracic spine is fused as described above. 2. The remainder of the thoracic spine is intact. 3. No evidence of epidural hematoma or other discrete severe encroachment upon the spinal canal. 4. Aortic Atherosclerosis (ICD10-I70.0). Electronically Signed   By: Lorriane Shire M.D.   On: 02/28/2019 16:07    (Echo, Carotid, EGD, Colonoscopy, ERCP)    Subjective: Patient resting in bed anxious to go home no new  complaints  Discharge Exam: Vitals:   03/01/19 2108 03/02/19 0512  BP: 138/65 (!) 146/70  Pulse: 71 72  Resp: 20 18  Temp: 98.9 F (37.2 C) 98.6 F (37 C)  SpO2: 99% 93%   Vitals:   03/01/19 0905 03/01/19 1349 03/01/19 2108 03/02/19 0512  BP: 116/62 135/69 138/65 (!) 146/70  Pulse:  66 71 72  Resp:  20 20 18   Temp:  98.3 F (36.8 C) 98.9 F (37.2 C) 98.6 F (37 C)  TempSrc:  Oral Oral Oral  SpO2:  93% 99% 93%  Weight:    97.3 kg  Height:  General: Pt is alert, awake, not in acute distress Cardiovascular: RRR, S1/S2 +, no rubs, no gallops Respiratory: CTA bilaterally, no wheezing, no rhonchi Abdominal: Soft, NT, ND, bowel sounds + Extremities: no edema, no cyanosis    The results of significant diagnostics from this hospitalization (including imaging, microbiology, ancillary and laboratory) are listed below for reference.     Microbiology: Recent Results (from the past 240 hour(s))  SARS CORONAVIRUS 2 (TAT 6-24 HRS) Nasopharyngeal Nasopharyngeal Swab     Status: None   Collection Time: 02/28/19 12:24 PM   Specimen: Nasopharyngeal Swab  Result Value Ref Range Status   SARS Coronavirus 2 NEGATIVE NEGATIVE Final    Comment: (NOTE) SARS-CoV-2 target nucleic acids are NOT DETECTED. The SARS-CoV-2 RNA is generally detectable in upper and lower respiratory specimens during the acute phase of infection. Negative results do not preclude SARS-CoV-2 infection, do not rule out co-infections with other pathogens, and should not be used as the sole basis for treatment or other patient management decisions. Negative results must be combined with clinical observations, patient history, and epidemiological information. The expected result is Negative. Fact Sheet for Patients: SugarRoll.be Fact Sheet for Healthcare Providers: https://www.woods-.com/ This test is not yet approved or cleared by the [place] FDA and  has  been authorized for detection and/or diagnosis of SARS-CoV-2 by FDA under an Emergency Use Authorization (EUA). This EUA will remain  in effect (meaning this test can be used) for the duration of the COVID-19 declaration under Section 56 4(b)(1) of the Act, 21 U.S.C. section 360bbb-3(b)(1), unless the authorization is terminated or revoked sooner. Performed at [facility], [place] [address], [place],  [address]      Labs: BNP (last 3 results) Recent Labs    02/28/19 1149  BNP [id number]*   Basic Metabolic Panel: Recent Labs  Lab 02/28/19 1149 02/28/19 1555 03/01/19 1618 03/02/19 0435  NA 137  --  138 140  K 4.5  --  4.6 4.7  CL 111  --  111 115*  CO2 20*  --  19* 18*  GLUCOSE 245*  --  135* 189*  BUN 53*  --  51* 46*  CREATININE 3.35*  --  3.23* 3.03*  CALCIUM 8.8*  --  8.7* 8.7*  MG  --  1.8  --   --    Liver Function Tests: Recent Labs  Lab 02/28/19 1149  AST 17  ALT 13  ALKPHOS 89  BILITOT 0.9  PROT 7.2  ALBUMIN 3.5   No results for input(s): LIPASE, AMYLASE in the last 168 hours. No results for input(s): AMMONIA in the last 168 hours. CBC: Recent Labs  Lab 02/28/19 1149 03/01/19 0443  WBC 4.6 3.6*  NEUTROABS 2.9  --   HGB 10.4* 9.4*  HCT 32.9* 30.6*  MCV 88.2 89.2  PLT 216 208   Cardiac Enzymes: No results for input(s): CKTOTAL, CKMB, CKMBINDEX, TROPONINI in the last 168 hours. BNP: Invalid input(s): POCBNP CBG: Recent Labs  Lab 03/01/19 1225 03/01/19 1658 03/01/19 2105 03/02/19 0510 03/02/19 0728  GLUCAP 81 147* 145* 195* 162*   D-Dimer No results for input(s): DDIMER in the last 72 hours. Hgb A1c Recent Labs    02/28/19 1924  HGBA1C 9.0*   Lipid Profile No results for input(s): CHOL, HDL, LDLCALC, TRIG, CHOLHDL, LDLDIRECT in the last 72 hours. Thyroid function studies No results for input(s): TSH, T4TOTAL, T3FREE, THYROIDAB in the last 72 hours.  Invalid input(s): FREET3 Anemia work up No results for  input(s):  VITAMINB12, FOLATE, FERRITIN, TIBC, IRON, RETICCTPCT in the last 72 hours. Urinalysis    Component Value Date/Time   COLORURINE YELLOW 02/28/2019 1251   APPEARANCEUR CLEAR 02/28/2019 1251   LABSPEC 1.012 02/28/2019 1251   PHURINE 5.0 02/28/2019 1251   GLUCOSEU 50 (A) 02/28/2019 1251   HGBUR NEGATIVE 02/28/2019 1251   BILIRUBINUR NEGATIVE 02/28/2019 1251   KETONESUR NEGATIVE 02/28/2019 1251   PROTEINUR 100 (A) 02/28/2019 1251   UROBILINOGEN 0.2 09/15/2014 1940   NITRITE NEGATIVE 02/28/2019 1251   LEUKOCYTESUR NEGATIVE 02/28/2019 1251   Sepsis Labs Invalid input(s): PROCALCITONIN,  WBC,  LACTICIDVEN Microbiology Recent Results (from the past 240 hour(s))  SARS CORONAVIRUS 2 (TAT 6-24 HRS) Nasopharyngeal Nasopharyngeal Swab     Status: None   Collection Time: 02/28/19 12:24 PM   Specimen: Nasopharyngeal Swab  Result Value Ref Range Status   SARS Coronavirus 2 NEGATIVE NEGATIVE Final    Comment: (NOTE) SARS-CoV-2 target nucleic acids are NOT DETECTED. The SARS-CoV-2 RNA is generally detectable in upper and lower respiratory specimens during the acute phase of infection. Negative results do not preclude SARS-CoV-2 infection, do not rule out co-infections with other pathogens, and should not be used as the sole basis for treatment or other patient management decisions. Negative results must be combined with clinical observations, patient history, and epidemiological information. The expected result is Negative. Fact Sheet for Patients: SugarRoll.be Fact Sheet for Healthcare Providers: https://www.woods-.com/ This test is not yet approved or cleared by the [place] FDA and  has been authorized for detection and/or diagnosis of SARS-CoV-2 by FDA under an Emergency Use Authorization (EUA). This EUA will remain  in effect (meaning this test can be used) for the duration of the COVID-19 declaration under Section 56 4(b)(1) of the  Act, 21 U.S.C. section 360bbb-3(b)(1), unless the authorization is terminated or revoked sooner. Performed at [facility], [place] [address], [place], [place] [address]      Time coordinating discharge: 39 minutes  SIGNED:   Georgette Shell, MD  [facility] 03/02/2019, 10:40 AM Pager   If 7PM-7AM, please contact night-coverage [email or web address] Password [email or web address]

## 2019-03-02 NOTE — Progress Notes (Signed)
  Echocardiogram 2D Echocardiogram has been performed.  Zachary Hoffman 03/02/2019, 11:49 AM

## 2019-03-14 ENCOUNTER — Telehealth: Payer: Self-pay | Admitting: Cardiovascular Disease

## 2019-03-14 NOTE — Telephone Encounter (Signed)
Mo from [facility] wanted to know what type of fluid restriction Dr. Loletha Grayer would like this patient to be on. The phone number provided is a private number and orders can be left via voicemail if she is unavailable.

## 2019-03-14 NOTE — Telephone Encounter (Signed)
2000 mL per day is appropriate

## 2019-03-14 NOTE — Telephone Encounter (Signed)
Spoke with Mo from [facility]. Gave Dr C advice of 2081mL fluid restriction

## 2019-04-05 ENCOUNTER — Emergency Department (HOSPITAL_COMMUNITY)
Admission: EM | Admit: 2019-04-05 | Discharge: 2019-04-05 | Disposition: A | Discharge status: Home / Self Care | Payer: Medicare Other | Attending: Emergency Medicine | Admitting: Emergency Medicine

## 2019-04-05 ENCOUNTER — Other Ambulatory Visit: Payer: Self-pay

## 2019-04-05 ENCOUNTER — Encounter (HOSPITAL_COMMUNITY): Payer: Self-pay

## 2019-04-05 DIAGNOSIS — R630 Anorexia: Secondary | ICD-10-CM | POA: Diagnosis not present

## 2019-04-05 DIAGNOSIS — Z20828 Contact with and (suspected) exposure to other viral communicable diseases: Secondary | ICD-10-CM | POA: Insufficient documentation

## 2019-04-05 DIAGNOSIS — Z794 Long term (current) use of insulin: Secondary | ICD-10-CM | POA: Insufficient documentation

## 2019-04-05 DIAGNOSIS — I2510 Atherosclerotic heart disease of native coronary artery without angina pectoris: Secondary | ICD-10-CM | POA: Diagnosis not present

## 2019-04-05 DIAGNOSIS — R634 Abnormal weight loss: Secondary | ICD-10-CM | POA: Diagnosis not present

## 2019-04-05 DIAGNOSIS — F17210 Nicotine dependence, cigarettes, uncomplicated: Secondary | ICD-10-CM | POA: Diagnosis not present

## 2019-04-05 DIAGNOSIS — R112 Nausea with vomiting, unspecified: Secondary | ICD-10-CM | POA: Insufficient documentation

## 2019-04-05 DIAGNOSIS — I130 Hypertensive heart and chronic kidney disease with heart failure and stage 1 through stage 4 chronic kidney disease, or unspecified chronic kidney disease: Secondary | ICD-10-CM | POA: Diagnosis not present

## 2019-04-05 DIAGNOSIS — N182 Chronic kidney disease, stage 2 (mild): Secondary | ICD-10-CM | POA: Diagnosis not present

## 2019-04-05 DIAGNOSIS — E1122 Type 2 diabetes mellitus with diabetic chronic kidney disease: Secondary | ICD-10-CM | POA: Diagnosis not present

## 2019-04-05 DIAGNOSIS — I509 Heart failure, unspecified: Secondary | ICD-10-CM | POA: Insufficient documentation

## 2019-04-05 DIAGNOSIS — J45909 Unspecified asthma, uncomplicated: Secondary | ICD-10-CM | POA: Diagnosis not present

## 2019-04-05 DIAGNOSIS — Z79899 Other long term (current) drug therapy: Secondary | ICD-10-CM | POA: Insufficient documentation

## 2019-04-05 DIAGNOSIS — Z96642 Presence of left artificial hip joint: Secondary | ICD-10-CM | POA: Diagnosis not present

## 2019-04-05 LAB — COMPREHENSIVE METABOLIC PANEL
ALT: 18 U/L (ref 0–44)
AST: 20 U/L (ref 15–41)
Albumin: 3.9 g/dL (ref 3.5–5.0)
Alkaline Phosphatase: 101 U/L (ref 38–126)
Anion gap: 12 (ref 5–15)
BUN: 29 mg/dL — ABNORMAL HIGH (ref 8–23)
CO2: 21 mmol/L — ABNORMAL LOW (ref 22–32)
Calcium: 9.4 mg/dL (ref 8.9–10.3)
Chloride: 104 mmol/L (ref 98–111)
Creatinine, Ser: 3.6 mg/dL — ABNORMAL HIGH (ref 0.61–1.24)
GFR calc Af Amer: 18 mL/min — ABNORMAL LOW (ref >60)
GFR calc non Af Amer: 15 mL/min — ABNORMAL LOW (ref >60)
Glucose, Bld: 256 mg/dL — ABNORMAL HIGH (ref 70–99)
Potassium: 3.5 mmol/L (ref 3.5–5.1)
Sodium: 137 mmol/L (ref 135–145)
Total Bilirubin: 1 mg/dL (ref 0.3–1.2)
Total Protein: 8 g/dL (ref 6.5–8.1)

## 2019-04-05 LAB — CBC
HCT: 34.8 % — ABNORMAL LOW (ref 39.0–52.0)
Hemoglobin: 11 g/dL — ABNORMAL LOW (ref 13.0–17.0)
MCH: 27 pg (ref 26.0–34.0)
MCHC: 31.6 g/dL (ref 30.0–36.0)
MCV: 85.5 fL (ref 80.0–100.0)
Platelets: 278 10*3/uL (ref 150–400)
RBC: 4.07 MIL/uL — ABNORMAL LOW (ref 4.22–5.81)
RDW: 13.9 % (ref 11.5–15.5)
WBC: 4.7 10*3/uL (ref 4.0–10.5)
nRBC: 0 % (ref 0.0–0.2)

## 2019-04-05 LAB — LIPASE, BLOOD: Lipase: 39 U/L (ref 11–51)

## 2019-04-05 LAB — POC SARS CORONAVIRUS 2 AG -  ED: SARS Coronavirus 2 Ag: NEGATIVE

## 2019-04-05 MED ORDER — SODIUM CHLORIDE 0.9 % IV BOLUS
1000.0000 mL | Freq: Once | INTRAVENOUS | Status: AC
  Administered 2019-04-05: 16:00:00 1000 mL via INTRAVENOUS

## 2019-04-05 MED ORDER — SODIUM CHLORIDE 0.9% FLUSH: 3.0000 mL | Freq: Once | INTRAVENOUS | Status: DC

## 2019-04-05 NOTE — Discharge Instructions (Addendum)
Your Covid test should result in 24-48 hours.  Try drinking Ensure or Boost as a calorie supplement at home, 1 bottle per day, in addition to what you are eating already.  Keep an eye on your weight and weigh yourself regularly.  Call our GI doctors at the number circled on your papers and ask for the first available appointment.  I placed a referral electronically in our system.

## 2019-04-05 NOTE — ED Triage Notes (Signed)
Pt arrives today c/o nausea, emesis, and lack of appetite x 1 month. Pt reports no relief with meds prescribed by PCP.  Pt reports some diarrhea as well.

## 2019-04-05 NOTE — ED Provider Notes (Signed)
[facility] DEPT Provider Note   CSN: [id number] Arrival date & time: 04/05/19  1257     History Chief Complaint  Patient presents with  . Emesis    Zachary Hoffman is a 78 y.o. male history of chest of heart failure, stage III kidney disease, MI status post stenting, type 2 diabetes on insulin, hyperlipidemia, T10-T11 compression fractures, presenting to the emergency department with complaint of diminished appetite and weight loss for the past month.  Patient reports has had ongoing symptoms ever since he was discharged in the hospital at the end of November, at that time evaluated for syncopal episodes.  He states he lost his sense of smell and taste soon after that.  He has had a very poor appetite since returning home.  He says everything smells and taste the same, he generally is not hungry.  He says he tries to force himself of acute sips of much with some toast daily, but has been about it.  He reports some nausea but has only had sporadic episodes of vomiting, not consistent vomiting.  He denies any diarrhea or constipation.  He denies any pain in abdomen.  He denies any chest pain or shortness of breath.  Believes he is lost about 30 pounds over the past month.  Reports his weight went from 210 to 180 lbs.   He has been using Pepto-Bismol intermittently at home, she believes that been helping with his appetite.   NKDA  HPI     Past Medical History:  Diagnosis Date  . Asthma   . CHF (congestive heart failure) ([place])    Echo 07/2006 with normal LV fxn, and diastolic dysfxn  . Coronary artery disease   . Diabetes mellitus    On insulin   . Diverticulosis 05/2009   Noted on screening colo   . Dyslipidemia   . Eczema   . Headache(784.0)   . Hypertension   . 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 MI (myocardial infarction) ([place])    S/p stenting per patient. Last stress 04/2007 negative   . Neuropathy   . Pancreatitis    P/H  . Peripheral arterial disease (HCC)    S/p left femoro-femoral bypass. LE cath by Dr. Einar Gip 09/2007 without significant stenosis  . Tobacco abuse     Patient Active Problem List   Diagnosis Date Noted  . CKD (chronic kidney disease), stage IV ([place]) 02/28/2019  . Intractable back pain 02/28/2019  . T10 vertebral fracture ([place]) 02/28/2019  . T11 vertebral fracture ([place]) 02/28/2019  . Ambulatory dysfunction 02/28/2019  . AKI (acute kidney injury) ([place]) 02/28/2019  . Tobacco abuse 06/06/2014  . OSA on CPAP 02/22/2013  . DM2 (diabetes mellitus, type 2) ([place]) 02/22/2013  . Hyperlipidemia with target LDL less than 70 02/22/2013  . PVD (peripheral vascular disease)- Hx F-F in [place], last dopplers [facility] 6/13 10/25/2012  . CAD (coronary artery disease)- Hx of LAD stent in '04- low risk Myoview 6/12 10/25/2012  . Edema- Lt leg, new since May 10/25/2012  . Unspecified sleep apnea by history 10/25/2012  . Obesity 10/25/2012  . Situational stress- going through his 3d seperation (same wife) 10/25/2012  . Renal insufficiency 06/10/2011  . Hypertension     Past Surgical History:  Procedure Laterality Date  . ADENOIDECTOMY    . CORONARY ANGIOPLASTY WITH STENT PLACEMENT  2004   Mid LAD, 95% first obtuse marginal   . DOPPLER ECHOCARDIOGRAPHY  07/28/2006   Mod.  LVH,EF =>55%,LA mildly dilated,mitral annular ca+,AOV mildly sclerotic,trace AI  . FEMORAL-FEMORAL BYPASS GRAFT     In [place], on the left. Patent on cath 09/2007  . LOWER EXTREMITY ANGIOGRAM  09/20/2007   no significant stenosis  . Nuclear Stress Test  09/17/2010   No ischemia  . REPLACEMENT TOTAL HIP W/  RESURFACING IMPLANTS     Left  . TONSILLECTOMY         Family History  Problem Relation Age of Onset  . Stroke Mother   . Diabetes Father   . Cancer Father   . Asthma Son   . Multiple sclerosis Daughter   . Eczema Daughter   . Allergic rhinitis Neg Hx   . Angioedema Neg Hx   . Immunodeficiency Neg Hx   . Urticaria Neg Hx       Social History   Tobacco Use  . Smoking status: Current Some Day Smoker    Packs/day: 0.50    Years: 50.00    Pack years: 25.00    Types: Cigarettes  . Smokeless tobacco: Never Used  Substance Use Topics  . Alcohol use: No  . Drug use: No    Home Medications Prior to Admission medications   Medication Sig Start Date End Date Taking? Authorizing Provider  amLODipine (NORVASC) 10 MG tablet Take 10 mg by mouth daily. 02/07/19  Yes [provider]  cetirizine (ZYRTEC) 10 MG tablet Take 10 mg by mouth daily as needed for allergies.  06/13/15  Yes [provider]  clopidogrel (PLAVIX) 75 MG tablet Take 75 mg by mouth daily.   Yes [provider]  fluticasone (FLONASE) 50 MCG/ACT nasal spray Place 2 sprays into both nostrils daily as needed for allergies.  05/18/15  Yes [provider]  furosemide (LASIX) 40 MG tablet Take 40 mg by mouth.   Yes [provider]  hydrALAZINE (APRESOLINE) 50 MG tablet Take 50 mg by mouth 2 (two) times daily.  11/05/18  Yes [provider]  insulin glargine (LANTUS) 100 UNIT/ML injection Take 15 units nightly 03/02/19 03/01/20 Yes Georgette Shell, MD  MITIGARE 0.6 MG CAPS Take 0.6 mg by mouth daily as needed (gout attacks).   Yes [provider]  ondansetron (ZOFRAN-ODT) 4 MG disintegrating tablet Take 4 mg by mouth every 8 (eight) hours as needed for nausea. 03/15/19  Yes [provider]  pantoprazole (PROTONIX) 40 MG tablet Take 40 mg by mouth daily. Reported on 06/29/2015   Yes [provider]  simvastatin (ZOCOR) 20 MG tablet Take 20 mg by mouth every evening.   Yes [provider]  sucralfate (CARAFATE) 1 GM/10ML suspension Take 30 mLs by mouth 4 (four) times daily. 03/22/19  Yes [provider]  tiZANidine (ZANAFLEX) 4 MG tablet Take 4 mg by mouth 2 (two) times daily as needed for muscle spasms.  02/15/18  Yes [provider]  docusate sodium  (COLACE) 100 MG capsule Take 1 capsule (100 mg total) by mouth 2 (two) times daily. Patient not taking: Reported on 04/05/2019 03/02/19   Georgette Shell, MD  insulin glulisine (APIDRA) 100 UNIT/ML injection Inject 0.05 mLs (5 Units total) into the skin 3 (three) times daily before meals. Patient not taking: Reported on 04/05/2019 03/02/19   Georgette Shell, MD  nicotine (NICODERM CQ - DOSED IN MG/24 HOURS) 21 mg/24hr patch Place 1 patch (21 mg total) onto the skin daily. Patient not taking: Reported on 04/05/2019 03/02/19   Georgette Shell, MD  oxyCODONE (OXY IR/ROXICODONE) 5 MG immediate release tablet Take 1 tablet (5 mg total) by mouth every 4 (four) hours as needed for moderate pain. Patient not taking: Reported on 04/05/2019 03/02/19   Georgette Shell, MD  polyethylene glycol (MIRALAX / GLYCOLAX) 17 g packet Take 17 g by mouth daily as needed for mild constipation. Patient not taking: Reported on 04/05/2019 03/02/19   Georgette Shell, MD  PRODIGY NO CODING BLOOD GLUC test strip 1 each by Other route 3 (three) times daily.  06/15/18   [provider]    Allergies    Patient has no known allergies.  Review of Systems   Review of Systems  Constitutional: Positive for activity change, appetite change and fatigue. Negative for chills and fever.  Eyes: Negative for photophobia and visual disturbance.  Respiratory: Negative for cough and shortness of breath.   Cardiovascular: Negative for chest pain and palpitations.  Gastrointestinal: Positive for nausea and vomiting. Negative for abdominal distention, abdominal pain, constipation and diarrhea.  Musculoskeletal: Positive for arthralgias and back pain.  Skin: Negative for rash.  Neurological: Negative for syncope and light-headedness.  All other systems reviewed and are negative.   Physical Exam Updated Vital Signs BP (!) 155/73   Pulse 99   Temp 98.8 F (37.1 C) (Oral)   Resp (!) 22   Wt 83 kg    SpO2 100%   BMI 28.66 kg/m   Physical Exam Vitals and nursing note reviewed.  Constitutional:      Appearance: He is well-developed.  HENT:     Head: Normocephalic and atraumatic.  Eyes:     Conjunctiva/sclera: Conjunctivae normal.  Cardiovascular:     Rate and Rhythm: Normal rate and regular rhythm.     Pulses: Normal pulses.  Pulmonary:     Effort: Pulmonary effort is normal. No respiratory distress.     Breath sounds: Normal breath sounds.  Abdominal:     General: There is no distension.     Palpations: Abdomen is soft.     Tenderness: There is no abdominal tenderness. There is no guarding or rebound.  Musculoskeletal:     Cervical back: Neck supple.  Skin:    General: Skin is warm and dry.  Neurological:     Mental Status: He is alert.  Psychiatric:        Mood and Affect: Mood normal.        Behavior: Behavior normal.     ED Results / Procedures / Treatments   Labs (all labs ordered are listed, but only abnormal results are displayed) Labs Reviewed  COMPREHENSIVE METABOLIC PANEL - Abnormal; Notable for the following components:      Result Value   CO2 21 (*)    Glucose, Bld 256 (*)    BUN 29 (*)    Creatinine, Ser 3.60 (*)    GFR calc non Af Amer 15 (*)    GFR calc Af Amer 18 (*)    All other components within normal limits  CBC - Abnormal; Notable for the following components:   RBC 4.07 (*)    Hemoglobin 11.0 (*)    HCT 34.8 (*)    All other components within normal limits  SARS CORONAVIRUS 2 (TAT 6-24 HRS)  LIPASE, BLOOD  URINALYSIS, ROUTINE W REFLEX MICROSCOPIC  POC SARS CORONAVIRUS 2 AG -  ED    EKG None  Radiology No results found.  Procedures Procedures (including critical care time)  Medications Ordered in ED Medications  sodium chloride  0.9 % bolus 1,000 mL (0 mLs Intravenous Stopped 04/05/19 1707)    ED Course  I have reviewed the triage vital signs and the nursing notes.  Pertinent labs & imaging results that were available  during my care of the patient were reviewed by me and considered in my medical decision making (see chart for details).  78 year old male with a history as noted above presented to the ED with diminished appetite for approximately 1 month.  Reports loss of taste and smell, mild nausea, sporadic episodes of vomiting.  He does not feel constipated.  Physical exam is quite well-appearing.  He was mildly tachycardic on arrival but this improved with some IV fluids.  He is afebrile.  Is a very benign abdominal exam which is not suggestive to me of an acute infectious or inflammatory intra-abdominal process.  His labs do not show any signs of dehydration, although his potassium has dropped somewhat from his hospitalization a month ago (now 3.5, still within normal limits).  His POC Covid test was negative, but we discussed and he agreed to send a PCR - given his loss of taste and smell, this remains high on my differential.  We discussed the situation at length.  I understand his frustrations, and his concerns for his weight loss.  I do believe he should see a gastroenterologist, and may benefit from an endoscopy or colonoscopy, but there is no emergent indication for hospitalization at this time.  His PCP has tried a variety of nausea medications and the patient believes nothing but peptobismal has helped him.    I advised him to try Boost or Ensure drinks to keep his calorie count up at home, and follow up with GI.  Clinical Course as of Apr 04 1724  Tue Apr 05, 2019  1543 BMP close to baseline, some hyperglycemia but no anion gap suggestive of DKA   [MT]  1614 HR improving with IVF   [MT]  1630 SARS Coronavirus 2 Ag: NEGATIVE [MT]    Clinical Course User Index [MT] Wyvonnia Dusky, MD    Final Clinical Impression(s) / ED Diagnoses Final diagnoses:  Weight loss  Loss of appetite    Rx / DC Orders ED Discharge Orders         Ordered    Ambulatory referral to Gastroenterology     04/05/19  1706           Wyvonnia Dusky, MD 04/05/19 1725

## 2019-04-06 ENCOUNTER — Encounter: Payer: Self-pay | Admitting: *Deleted

## 2019-04-06 LAB — SARS CORONAVIRUS 2 (TAT 6-24 HRS): SARS Coronavirus 2: NEGATIVE

## 2019-04-07 ENCOUNTER — Other Ambulatory Visit (INDEPENDENT_AMBULATORY_CARE_PROVIDER_SITE_OTHER): Payer: Medicare Other

## 2019-04-07 ENCOUNTER — Encounter: Payer: Self-pay | Admitting: Nurse Practitioner

## 2019-04-07 ENCOUNTER — Ambulatory Visit (INDEPENDENT_AMBULATORY_CARE_PROVIDER_SITE_OTHER): Payer: Medicare Other | Admitting: Nurse Practitioner

## 2019-04-07 VITALS — BP 122/56 | HR 106 | Temp 97.0°F | Ht 67.0 in | Wt 185.2 lb

## 2019-04-07 DIAGNOSIS — R110 Nausea: Secondary | ICD-10-CM

## 2019-04-07 DIAGNOSIS — R634 Abnormal weight loss: Secondary | ICD-10-CM | POA: Diagnosis not present

## 2019-04-07 LAB — TSH: TSH: 0.01 u[IU]/mL — ABNORMAL LOW (ref 0.35–4.50)

## 2019-04-07 NOTE — Progress Notes (Signed)
IMPRESSION and PLAN:    78 year old male with hyperlipidemia, hypertension, and uncontrolled diabetes, CKD4, diastolic CHF, CAD status post stent, chronic plavix  Weight loss with associated loss of smell , taste and nausea. Covid negative.  Weight down from 214 pounds in late November to 185 pounds today.  Etiology of weight loss unclear but as of yesterday his symptoms have started to improve. He has uncontroled DM, (hgb A1c ~ 9).  CBG at home last night was 341. Hyperglycemia can contribute to nausea.  --patient will talk with PCP about elevated blood sugars on 04/13/18 .  --As of yesterday his smell , taste and appetite have improved. Will hold off on further testing. If he doesn't continue to improve patient will call to get scheduled for EGD. If EGD negative and symptoms don't improve then consider CT scan for weight loss.  --Patient has never had a colonoscopy, these aren't usually started at his age.  He has no bowel changes or blood in stool.  He does have chronic, mild normocytic anemia but in setting of CKD --history of low TSH in August 2019. Check TSH today    HPI:    Chief complaint:  weight loss and nausea but getting better  Patient is a 78 year old male new to the practice, referred by Octaviano Glow, MD.  He has multiple medial problems including hyperlipidemia, hypertension, DM, CKD four, diastolic CHF, CAD status post stent, and PVD. He is on chronic plavix. Patient here for evaluation of weight loss.   Patient was hospitalized late November for evaluation of syncope which apparently turned out to be hypoglycemic episodes.  He had a T10 and T11 fracture, neurosurgery recommended brace.  Since hospital discharge he has lost ~ 25 pounds. He lost taste, smell and appetite after returning home. He was COVID negative in hospital and again 2-3 days ago. Hasn't started any new medications. This has happened to him before. He has no associated abdominal pain but does  have slight nausea at times . A few weeks ago he stopped all his meds but noticed no improvement in symptoms so he resumed meds after a week or so. Just yesterday did appetite, smell and taste start to come back. He ate chicken and part a piece of a [place] sandwich. Other than that has been eating soups and just started drinking Ensure once daily. Not taking Miralax, just Colace as needed. Stools are soft. No blood in stools. No known Gardner of colon or gastric cancers.   DATA REVIEWED: Labs 04/05/2019 Lipase 39, liver tests normal including albumin of 3.9 WBC 4.7 Hgb 11, MCV 85  Review of systems:     No chest pain, no SOB, no fevers, no urinary sx   Past Medical History:  Diagnosis Date  . Asthma   . CHF (congestive heart failure) ([place])    Echo 07/2006 with normal LV fxn, and diastolic dysfxn  . CKD (chronic kidney disease), stage III   . Coronary artery disease   . Diabetes mellitus    On insulin   . Diverticulosis 05/2009   Noted on screening colo   . Dyslipidemia   . Eczema   . Gout   . Headache(784.0)   . Hypertension   . MI (myocardial infarction) ([place])    S/p stenting per patient. Last stress 04/2007 negative   . Neuropathy   . Pancreatitis    P/H  . Peripheral arterial disease (HCC)    S/p left femoro-femoral bypass.  LE cath by Dr. Einar Gip 09/2007 without significant stenosis  . Sleep apnea   . Tobacco abuse     Patient's surgical history, family medical history, social history, medications and allergies were all reviewed in Epic   Serum creatinine: 3.6 mg/dL (H) 04/05/19 1320 Estimated creatinine clearance: 17.8 mL/min (A)  Current Outpatient Medications  Medication Sig Dispense Refill  . amLODipine (NORVASC) 10 MG tablet Take 10 mg by mouth daily.    . cetirizine (ZYRTEC) 10 MG tablet Take 10 mg by mouth daily as needed for allergies.   5  . clopidogrel (PLAVIX) 75 MG tablet Take 75 mg by mouth daily.    Marland Kitchen docusate sodium (COLACE) 100 MG capsule Take 1 capsule (100 mg  total) by mouth 2 (two) times daily. 10 capsule 0  . fluticasone (FLONASE) 50 MCG/ACT nasal spray Place 2 sprays into both nostrils daily as needed for allergies.   0  . furosemide (LASIX) 40 MG tablet Take 40 mg by mouth.    . hydrALAZINE (APRESOLINE) 50 MG tablet Take 50 mg by mouth 2 (two) times daily.     . insulin glargine (LANTUS) 100 UNIT/ML injection Take 15 units nightly 10 mL 3  . MITIGARE 0.6 MG CAPS Take 0.6 mg by mouth daily as needed (gout attacks).    . ondansetron (ZOFRAN-ODT) 4 MG disintegrating tablet Take 4 mg by mouth every 8 (eight) hours as needed for nausea.    Marland Kitchen oxyCODONE (OXY IR/ROXICODONE) 5 MG immediate release tablet Take 1 tablet (5 mg total) by mouth every 4 (four) hours as needed for moderate pain. 30 tablet 0  . pantoprazole (PROTONIX) 40 MG tablet Take 40 mg by mouth daily. Reported on 06/29/2015    . polyethylene glycol (MIRALAX / GLYCOLAX) 17 g packet Take 17 g by mouth daily as needed for mild constipation. 14 each 0  . PRODIGY NO CODING BLOOD GLUC test strip 1 each by Other route 3 (three) times daily.     . simvastatin (ZOCOR) 20 MG tablet Take 20 mg by mouth every evening.    Marland Kitchen tiZANidine (ZANAFLEX) 4 MG tablet Take 4 mg by mouth 2 (two) times daily as needed for muscle spasms.   2   No current facility-administered medications for this visit.    Physical Exam:     BP (!) 122/56 (BP Location: Left Arm, Patient Position: Sitting, Cuff Size: Normal)   Pulse (!) 106   Temp (!) 97 F (36.1 C)   Ht 5\' 7"  (1.702 m)   Wt 185 lb 3.2 oz (84 kg)   SpO2 99%   BMI 29.01 kg/m   GENERAL:  Pleasant male in NAD PSYCH: : Cooperative, normal affect EENT:  conjunctiva pink, mucous membranes moist, neck supple without masses CARDIAC:  RRR,  PULM: Normal respiratory effort, lungs CTA bilaterally, no wheezing ABDOMEN:  Nondistended, soft, nontender. No obvious masses, no hepatomegaly,  normal bowel sounds SKIN:  turgor, no lesions seen Musculoskeletal:  Normal  muscle tone, normal strength NEURO: Alert and oriented x 3, no focal neurologic deficits   Tye Savoy , NP 04/07/2019, 10:55 AM   Cc: Octaviano Glow, MD

## 2019-04-07 NOTE — Progress Notes (Signed)
Attending Physician's Attestation   I have reviewed the chart.   I agree with the Advanced Practitioner's note, impression, and recommendations with any updates as below.  Agree with proceeding with upper endoscopy and then likely CT scan.  If CT scan is unremarkable I would recommend a colonoscopy.  If the patient wants to undergo both an upper and lower endoscopy at the same time would be happy to have that scheduled as well since he would need to come off anticoagulation for both, but defer to patient as to how he would like to begin moving forward with work-up.  But agree, that CT scan of the chest abdomen pelvis may be required.  Justice Britain, MD [facility] Office # [id number]

## 2019-04-07 NOTE — Patient Instructions (Signed)
Your provider has requested that you go to the basement level for lab work before leaving today. Press "B" on the elevator. The lab is located at the first door on the left as you exit the elevator.  Call our office if you have recurrent nausea or ongoing weight loss.  If you are age 77 or older, your body mass index should be between 23-30. Your Body mass index is 29.01 kg/m. If this is out of the aforementioned range listed, please consider follow up with your Primary Care Provider.  If you are age 45 or younger, your body mass index should be between 19-25. Your Body mass index is 29.01 kg/m. If this is out of the aformentioned range listed, please consider follow up with your Primary Care Provider.   Due to recent changes in healthcare laws, you may see the results of your imaging and laboratory studies on MyChart before your provider has had a chance to review them.  We understand that in some cases there may be results that are confusing or concerning to you. Not all laboratory results come back in the same time frame and the provider may be waiting for multiple results in order to interpret others.  Please give [place] 48 hours in order for your provider to thoroughly review all the results before contacting the office for clarification of your results.

## 2019-04-25 NOTE — Discharge Instructions (Signed)

## 2019-04-26 ENCOUNTER — Ambulatory Visit (HOSPITAL_COMMUNITY)
Admission: RE | Admit: 2019-04-26 | Discharge: 2019-04-26 | Disposition: A | Discharge status: Home / Self Care | Payer: Medicare Other | Source: Ambulatory Visit | Attending: Nephrology | Admitting: Nephrology

## 2019-04-26 ENCOUNTER — Other Ambulatory Visit: Payer: Self-pay

## 2019-04-26 VITALS — BP 138/63 | HR 97 | Temp 97.2°F | Resp 20

## 2019-04-26 DIAGNOSIS — N184 Chronic kidney disease, stage 4 (severe): Secondary | ICD-10-CM

## 2019-04-26 LAB — POCT HEMOGLOBIN-HEMACUE: Hemoglobin: 9.2 g/dL — ABNORMAL LOW (ref 13.0–17.0)

## 2019-04-26 MED ORDER — EPOETIN ALFA-EPBX 2000 UNIT/ML IJ SOLN
INTRAMUSCULAR | Status: AC
  Filled 2019-04-26: qty 1

## 2019-04-26 MED ORDER — EPOETIN ALFA-EPBX 10000 UNIT/ML IJ SOLN
INTRAMUSCULAR | Status: AC
  Filled 2019-04-26: qty 1

## 2019-04-26 MED ORDER — EPOETIN ALFA-EPBX 3000 UNIT/ML IJ SOLN
INTRAMUSCULAR | Status: AC
  Filled 2019-04-26: qty 1

## 2019-04-26 MED ORDER — EPOETIN ALFA-EPBX 10000 UNIT/ML IJ SOLN
15000.0000 [IU] | INTRAMUSCULAR | Status: DC
  Administered 2019-04-26: 15000 [IU] via SUBCUTANEOUS

## 2019-04-27 ENCOUNTER — Other Ambulatory Visit: Payer: Self-pay | Admitting: Student

## 2019-04-27 DIAGNOSIS — S22078A Other fracture of T9-T10 vertebra, initial encounter for closed fracture: Secondary | ICD-10-CM

## 2019-05-03 ENCOUNTER — Ambulatory Visit: Payer: Medicare Other | Admitting: Nurse Practitioner

## 2019-05-10 ENCOUNTER — Encounter (HOSPITAL_COMMUNITY)
Admission: RE | Admit: 2019-05-10 | Discharge: 2019-05-10 | Disposition: A | Discharge status: Home / Self Care | Payer: Medicare Other | Source: Ambulatory Visit | Attending: Nephrology | Admitting: Nephrology

## 2019-05-10 ENCOUNTER — Other Ambulatory Visit: Payer: Self-pay

## 2019-05-10 VITALS — BP 147/64 | HR 94 | Temp 97.3°F | Resp 20

## 2019-05-10 DIAGNOSIS — N184 Chronic kidney disease, stage 4 (severe): Secondary | ICD-10-CM | POA: Insufficient documentation

## 2019-05-10 LAB — POCT HEMOGLOBIN-HEMACUE: Hemoglobin: 10.8 g/dL — ABNORMAL LOW (ref 13.0–17.0)

## 2019-05-10 MED ORDER — EPOETIN ALFA-EPBX 10000 UNIT/ML IJ SOLN: 15000.0000 [IU] | INTRAMUSCULAR | Status: DC

## 2019-05-10 MED ORDER — EPOETIN ALFA-EPBX 3000 UNIT/ML IJ SOLN
INTRAMUSCULAR | Status: AC
  Administered 2019-05-10: 3000 [IU] via SUBCUTANEOUS
  Filled 2019-05-10: qty 1

## 2019-05-10 MED ORDER — EPOETIN ALFA-EPBX 10000 UNIT/ML IJ SOLN
INTRAMUSCULAR | Status: AC
  Administered 2019-05-10: 10000 [IU] via SUBCUTANEOUS
  Filled 2019-05-10: qty 1

## 2019-05-10 MED ORDER — EPOETIN ALFA-EPBX 2000 UNIT/ML IJ SOLN
INTRAMUSCULAR | Status: AC
  Administered 2019-05-10: 2000 [IU] via SUBCUTANEOUS
  Filled 2019-05-10: qty 1

## 2019-05-16 ENCOUNTER — Ambulatory Visit
Admission: RE | Admit: 2019-05-16 | Discharge: 2019-05-16 | Disposition: A | Discharge status: Home / Self Care | Payer: Medicare Other | Source: Ambulatory Visit | Attending: Student | Admitting: Student

## 2019-05-16 DIAGNOSIS — S22078A Other fracture of T9-T10 vertebra, initial encounter for closed fracture: Secondary | ICD-10-CM

## 2019-05-18 ENCOUNTER — Other Ambulatory Visit: Payer: Self-pay

## 2019-05-20 ENCOUNTER — Ambulatory Visit: Payer: Medicare Other | Admitting: Endocrinology

## 2019-05-24 ENCOUNTER — Other Ambulatory Visit: Payer: Self-pay

## 2019-05-24 ENCOUNTER — Ambulatory Visit (HOSPITAL_COMMUNITY)
Admission: RE | Admit: 2019-05-24 | Discharge: 2019-05-24 | Disposition: A | Discharge status: Home / Self Care | Payer: Medicare Other | Source: Ambulatory Visit | Attending: Nephrology | Admitting: Nephrology

## 2019-05-24 VITALS — BP 155/59 | HR 86 | Temp 97.0°F | Resp 20

## 2019-05-24 DIAGNOSIS — N184 Chronic kidney disease, stage 4 (severe): Secondary | ICD-10-CM | POA: Insufficient documentation

## 2019-05-24 LAB — IRON AND TIBC
Iron: 83 ug/dL (ref 45–182)
Saturation Ratios: 30 % (ref 17.9–39.5)
TIBC: 277 ug/dL (ref 250–450)
UIBC: 194 ug/dL

## 2019-05-24 LAB — POCT HEMOGLOBIN-HEMACUE: Hemoglobin: 10.1 g/dL — ABNORMAL LOW (ref 13.0–17.0)

## 2019-05-24 LAB — FERRITIN: Ferritin: 151 ng/mL (ref 24–336)

## 2019-05-24 MED ORDER — EPOETIN ALFA-EPBX 3000 UNIT/ML IJ SOLN
INTRAMUSCULAR | Status: AC
  Administered 2019-05-24: 3000 [IU]
  Filled 2019-05-24: qty 1

## 2019-05-24 MED ORDER — EPOETIN ALFA-EPBX 10000 UNIT/ML IJ SOLN: 15000.0000 [IU] | INTRAMUSCULAR | Status: DC

## 2019-05-24 MED ORDER — EPOETIN ALFA-EPBX 2000 UNIT/ML IJ SOLN
INTRAMUSCULAR | Status: AC
  Administered 2019-05-24: 14:00:00 2000 [IU]
  Filled 2019-05-24: qty 1

## 2019-05-24 MED ORDER — EPOETIN ALFA-EPBX 10000 UNIT/ML IJ SOLN
INTRAMUSCULAR | Status: AC
  Administered 2019-05-24: 10000 [IU] via SUBCUTANEOUS
  Filled 2019-05-24: qty 1

## 2019-05-26 ENCOUNTER — Telehealth: Payer: Self-pay

## 2019-05-26 ENCOUNTER — Ambulatory Visit (INDEPENDENT_AMBULATORY_CARE_PROVIDER_SITE_OTHER): Payer: Medicare Other | Admitting: Endocrinology

## 2019-05-26 ENCOUNTER — Encounter: Payer: Self-pay | Admitting: Endocrinology

## 2019-05-26 DIAGNOSIS — E0590 Thyrotoxicosis, unspecified without thyrotoxic crisis or storm: Secondary | ICD-10-CM | POA: Insufficient documentation

## 2019-05-26 NOTE — Telephone Encounter (Signed)
Per Dr. Loanne Drilling, pt will need to be scheduled for imaging with Nuc Med. Will also need 2 day f/u appt post Nuc Med appt. Advised Dr. Loanne Drilling, since office is closed today and working remotely, this will need to be addressed upon return to the office. I will mail pt the contact info for Nuc Med so pt can schedule his own appt. Will continue efforts to complete tasks upon return to clinic.

## 2019-05-26 NOTE — Progress Notes (Signed)
Subjective:    Patient ID: Zachary Hoffman, male    DOB: 27-Aug-1940, 79 y.o.   MRN: [id number]  HPI telehealth visit today via phone x 14 minutes.  Alternatives to telehealth are presented to this patient, and the patient agrees to the telehealth visit. Pt is advised of the cost of the visit, and agrees to this, also.   Patient is at home, and I am at the office.   Persons attending the telehealth visit: the patient and I Pt is referred by for hyperthyroidism.  Pt reports he was dx'ed with hyperthyroidism in late 2020.  He has never been on therapy for this.  He has never had XRT to the anterior neck, or thyroid surgery.  He does not consume kelp or any other non-prescribed thyroid medication.  He has never been on amiodarone.   Past Medical History:  Diagnosis Date  . Asthma   . CHF (congestive heart failure) ([facility])    Echo 07/2006 with normal LV fxn, and diastolic dysfxn  . CKD (chronic kidney disease), stage III   . Coronary artery disease   . Diabetes mellitus    On insulin   . Diverticulosis 05/2009   Noted on screening colo   . Dyslipidemia   . Eczema   . Gout   . Headache(784.0)   . Hypertension   . MI (myocardial infarction) ([place])    S/p stenting per patient. Last stress 04/2007 negative   . Neuropathy   . Pancreatitis    P/H  . Peripheral arterial disease ([facility])    S/p left femoro-femoral bypass. LE cath by Dr. Einar Gip 09/2007 without significant stenosis  . Sleep apnea   . Tobacco abuse     Past Surgical History:  Procedure Laterality Date  . ADENOIDECTOMY    . CORONARY ANGIOPLASTY WITH STENT PLACEMENT  2004   Mid LAD, 95% first obtuse marginal   . DOPPLER ECHOCARDIOGRAPHY  07/28/2006   Mod. LVH,EF =>55%,LA mildly dilated,mitral annular ca+,AOV mildly sclerotic,trace AI  . FEMORAL-FEMORAL BYPASS GRAFT     In [place], on the left. Patent on cath 09/2007  . LOWER EXTREMITY ANGIOGRAM  09/20/2007   no significant stenosis  . Nuclear Stress Test  09/17/2010   No ischemia    . REPLACEMENT TOTAL HIP W/  RESURFACING IMPLANTS     Left  . TONSILLECTOMY      Social History   Socioeconomic History  . Marital status: Married    Spouse name: Not on file  . Number of children: Not on file  . Years of education: Not on file  . Highest education level: Not on file  Occupational History  . Not on file  Tobacco Use  . Smoking status: Former Smoker    Packs/day: 0.50    Years: 50.00    Pack years: 25.00    Types: Cigarettes    Quit date: 02/2019    Years since quitting: 0.3  . Smokeless tobacco: Never Used  Substance and Sexual Activity  . Alcohol use: No  . Drug use: No  . Sexual activity: Not Currently  Other Topics Concern  . Not on file  Social History Narrative   Lives with wife, has grown children. Still active without cane or walker.    Social Determinants of Health   Financial Resource Strain:   . Difficulty of Paying Living Expenses: Not on file  Food Insecurity:   . Worried About Charity fundraiser in the Last Year: Not on file  .  Ran Out of Food in the Last Year: Not on file  Transportation Needs:   . Lack of Transportation (Medical): Not on file  . Lack of Transportation (Non-Medical): Not on file  Physical Activity:   . Days of Exercise per Week: Not on file  . Minutes of Exercise per Session: Not on file  Stress:   . Feeling of Stress : Not on file  Social Connections:   . Frequency of Communication with Friends and Family: Not on file  . Frequency of Social Gatherings with Friends and Family: Not on file  . Attends Religious Services: Not on file  . Active Member of Clubs or Organizations: Not on file  . Attends Archivist Meetings: Not on file  . Marital Status: Not on file  Intimate Partner Violence:   . Fear of Current or Ex-Partner: Not on file  . Emotionally Abused: Not on file  . Physically Abused: Not on file  . Sexually Abused: Not on file    Current Outpatient Medications on File Prior to Visit   Medication Sig Dispense Refill  . amLODipine (NORVASC) 10 MG tablet Take 10 mg by mouth daily.    . cetirizine (ZYRTEC) 10 MG tablet Take 10 mg by mouth daily as needed for allergies.   5  . clopidogrel (PLAVIX) 75 MG tablet Take 75 mg by mouth daily.    Marland Kitchen docusate sodium (COLACE) 100 MG capsule Take 1 capsule (100 mg total) by mouth 2 (two) times daily. 10 capsule 0  . fluticasone (FLONASE) 50 MCG/ACT nasal spray Place 2 sprays into both nostrils daily as needed for allergies.   0  . furosemide (LASIX) 40 MG tablet Take 40 mg by mouth.    . hydrALAZINE (APRESOLINE) 50 MG tablet Take 50 mg by mouth 2 (two) times daily.     . insulin glargine (LANTUS) 100 UNIT/ML injection Take 15 units nightly 10 mL 3  . MITIGARE 0.6 MG CAPS Take 0.6 mg by mouth daily as needed (gout attacks).    . ondansetron (ZOFRAN-ODT) 4 MG disintegrating tablet Take 4 mg by mouth every 8 (eight) hours as needed for nausea.    Marland Kitchen oxyCODONE (OXY IR/ROXICODONE) 5 MG immediate release tablet Take 1 tablet (5 mg total) by mouth every 4 (four) hours as needed for moderate pain. 30 tablet 0  . pantoprazole (PROTONIX) 40 MG tablet Take 40 mg by mouth daily. Reported on 06/29/2015    . polyethylene glycol (MIRALAX / GLYCOLAX) 17 g packet Take 17 g by mouth daily as needed for mild constipation. 14 each 0  . PRODIGY NO CODING BLOOD GLUC test strip 1 each by Other route 3 (three) times daily.     . simvastatin (ZOCOR) 20 MG tablet Take 20 mg by mouth every evening.    Marland Kitchen tiZANidine (ZANAFLEX) 4 MG tablet Take 4 mg by mouth 2 (two) times daily as needed for muscle spasms.   2   No current facility-administered medications on file prior to visit.    No Known Allergies  Family History  Problem Relation Age of Onset  . Stroke Mother   . Thyroid disease Mother   . Diabetes Father   . Cancer Father   . Thyroid disease Sister   . Asthma Son   . Multiple sclerosis Daughter   . Eczema Daughter   . Thyroid disease Daughter   .  Allergic rhinitis Neg Hx   . Angioedema Neg Hx   . Immunodeficiency Neg Hx   .  Urticaria Neg Hx   . Stomach cancer Neg Hx   . Esophageal cancer Neg Hx   . Pancreatic cancer Neg Hx   . Colon cancer Neg Hx     There were no vitals taken for this visit.   Review of Systems denies weight loss, headache, palpitations, sob, diarrhea, muscle weakness, excessive diaphoresis, tremor, anxiety, and heat intolerance.       Objective:   Physical Exam    Lab Results  Component Value Date   TSH 0.01 (L) 04/07/2019   T3TOTAL 126.2 09/20/2007   T4TOTAL 6.2 09/20/2007   2005: Clinical Data:    Reportedly the patient felt nodule on neck.  Reason for exam is evaluate goiter, multinodular. RADIONUCLIDE THYROID UPTAKE STUDY: Following the oral ingestion of 10.8 microcuries of I-131, the 24-hour uptake is 12.6%.  Normal range at 24 hours is 10-35%.   RADIONUCLIDE THYROID SCAN: Standard gamma camera static images were acquired following IV injection of 10 millicuries of technetium 18m pertechnetate.  No prior imaging studies are available for correlation. Normal and expected tracer uptake in the salivary glands.  There is a very multinodular appearance of both thyroid lobes, with apparent hyperfunctioning mass in the medial aspect of the mid polar region of the right thyroid lobe and also one in the medial to slightly inferior polar aspect of the left thyroid lobe and a smaller somewhat oval focus of relatively increased tracer uptake in the upper and lower poles of the right thyroid lobe. Probably relative suppression of tracer uptake in the remaining portions of the enlarged thyroid.  No discrete cold nodules are identified.  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  No dominant cold nodules are detected.  Functioning nodules are typically benign; however, due to the complex  appearance of the scintigraphic images,ultrasound would be helpful for further evaluation and also to serve as a baseline for possible future imaging.   2.  The 24-hour uptake is within normal limits, 12.6%.   outside test results are reviewed: TPO Ab=neg TSH=0.02 Free T4=1.3 Creat=3.1    Assessment & Plan:  Hyperthyroidism, new to me: we discussed rx options: he chooses [facility] rx  Patient Instructions  Let's recheck the nuclear medicine scan.  you will receive a phone call, about a day and time for an appointment. When you know this appointment, please call our office, to schedule an appt here 1-2 days later.

## 2019-05-26 NOTE — Patient Instructions (Signed)
Let's recheck the nuclear medicine scan.  you will receive a phone call, about a day and time for an appointment. When you know this appointment, please call our office, to schedule an appt here 1-2 days later.

## 2019-05-27 ENCOUNTER — Ambulatory Visit (INDEPENDENT_AMBULATORY_CARE_PROVIDER_SITE_OTHER): Payer: Medicare Other | Admitting: Podiatry

## 2019-05-27 ENCOUNTER — Other Ambulatory Visit: Payer: Self-pay

## 2019-05-27 ENCOUNTER — Encounter: Payer: Self-pay | Admitting: Podiatry

## 2019-05-27 DIAGNOSIS — L84 Corns and callosities: Secondary | ICD-10-CM | POA: Diagnosis not present

## 2019-05-27 DIAGNOSIS — M79676 Pain in unspecified toe(s): Secondary | ICD-10-CM | POA: Diagnosis not present

## 2019-05-27 DIAGNOSIS — B351 Tinea unguium: Secondary | ICD-10-CM

## 2019-05-27 DIAGNOSIS — Z794 Long term (current) use of insulin: Secondary | ICD-10-CM

## 2019-05-27 DIAGNOSIS — N183 Chronic kidney disease, stage 3 unspecified: Secondary | ICD-10-CM

## 2019-05-27 DIAGNOSIS — E0822 Diabetes mellitus due to underlying condition with diabetic chronic kidney disease: Secondary | ICD-10-CM

## 2019-05-27 NOTE — Patient Instructions (Signed)
Peripheral Vascular Disease Peripheral vascular disease (PVD) is a disease of the blood vessels. A simple term for PVD is poor circulation. In most cases, PVD narrows the blood vessels that carry blood from your heart to the rest of your body. This can result in a decreased supply of blood to your arms, legs, and internal organs, like your stomach or kidneys. However, it most often affects a person's lower legs and feet. There are two types of PVD.  Organic PVD. This is the more common type. It is caused by damage to the structure of blood vessels.  Functional PVD. This is caused by conditions that make blood vessels contract and tighten (spasm). Without treatment, PVD tends to get worse over time. PVD can also lead to acute limb ischemia. This is when an arm or leg suddenly has trouble getting enough blood. This is a medical emergency. What are the causes?  Each type of PVD has many different causes. The most common cause of PVD is buildup of a fatty material (plaque) inside your arteries (atherosclerosis). Small amounts of plaque can break off from the walls of the blood vessels and become lodged in a smaller artery. This blocks blood flow and can cause acute limb ischemia. Other common causes of PVD include:  Blood clots that form inside of blood vessels.  Injuries to blood vessels.  Diseases that cause inflammation of blood vessels or cause blood vessel spasms.  Health behaviors and health history that increase your risk of developing PVD. What increases the risk? You are more likely to develop this condition if:  You have a family history of PVD.  You have certain medical conditions, including: ? High cholesterol. ? Diabetes. ? High blood pressure (hypertension). ? Coronary heart disease. ? Past problems with blood clots. ? Past injury, such as burns or a broken bone. These may have damaged blood vessels in your limbs. ? Buerger disease. This is caused by inflamed blood vessels  in your hands and feet. ? Some forms of arthritis. ? Rare birth defects that affect the arteries in your legs. ? Kidney disease.  You use tobacco or smoke.  You do not get enough exercise.  You are obese.  You are age 50 or older. What are the signs or symptoms? This condition may cause different symptoms. Your symptoms depend on what part of your body is not getting enough blood. Some common signs and symptoms include:  Cramps in your lower legs. This may be a symptom of poor leg circulation (claudication).  Pain and weakness in your legs. This happens while you are physically active but goes away when you rest (intermittent claudication).  Leg pain when at rest.  Leg numbness, tingling, or weakness.  Coldness in a leg or foot, especially when compared with the other leg.  Skin or hair changes. These can include: ? Hair loss. ? Shiny skin. ? Pale or bluish skin. ? Thick toenails.  Inability to get or maintain an erection (erectile dysfunction).  Fatigue. People with PVD are more likely to develop ulcers and sores on their toes, feet, or legs. These may take longer than normal to heal. How is this diagnosed? This condition is diagnosed based on:  Your signs and symptoms.  A physical exam and your medical history.  Other tests to find out what is causing your PVD and to determine its severity. Tests may include: ? Blood pressure recordings from your arms and legs and measurements of the strength of your pulses (pulse volume   recordings). ? Imaging studies using sound waves to take pictures of the blood flow through your blood vessels (Doppler ultrasound). ? Injecting a dye into your blood vessels before having imaging studies using:  X-rays (angiogram or arteriogram).  Computer-generated X-rays (CT angiogram).  A powerful electromagnetic field and a computer (magnetic resonance angiogram or MRA). How is this treated? Treatment for PVD depends on the cause of your  condition and how severe your symptoms are. It also depends on your age. Underlying causes need to be treated and controlled. These include long-term (chronic) conditions, such as diabetes, high cholesterol, and high blood pressure. Treatment includes:  Lifestyle changes, such as: ? Quitting smoking. ? Exercising regularly. ? Following a low-fat, low-cholesterol diet.  Taking medicines, such as: ? Blood thinners to prevent blood clots. ? Medicines to improve blood flow. ? Medicines to improve your blood cholesterol levels.  Surgical procedures, such as: ? A procedure that uses an inflated balloon to open a blocked artery and improve blood flow (angioplasty). ? A procedure to put in a wire mesh tube to keep a blocked artery open (stent implant). ? Surgery to reroute blood flow around a blocked artery (peripheral bypass surgery). ? Surgery to remove dead tissue from an infected wound on the affected limb. ? Amputation. This is surgical removal of the affected limb. It may be necessary in cases of acute limb ischemia where there has been no improvement through medical or surgical treatments. Follow these instructions at home: Lifestyle  Do not use any products that contain nicotine or tobacco, such as cigarettes and e-cigarettes. If you need help quitting, ask your health care provider.  Lose weight if you are overweight, and maintain a healthy weight as discussed by your health care provider.  Eat a diet that is low in fat and cholesterol. If you need help, ask your health care provider.  Exercise regularly. Ask your health care provider to suggest some good activities for you. General instructions  Take over-the-counter and prescription medicines only as told by your health care provider.  Take good care of your feet: ? Wear comfortable shoes that fit well. ? Check your feet often for any cuts or sores.  Keep all follow-up visits as told by your health care provider. This is  important. Contact a health care provider if:  You have cramps in your legs while walking.  You have leg pain when you are at rest.  You have coldness in a leg or foot.  Your skin changes.  You have erectile dysfunction.  You have cuts or sores on your feet that are not healing. Get help right away if:  Your arm or leg turns cold, numb, and blue.  Your arms or legs become red, warm, swollen, painful, or numb.  You have chest pain or trouble breathing.  You suddenly have weakness in your face, arm, or leg.  You become very confused or lose the ability to speak.  You suddenly have a very bad headache or lose your vision. Summary  Peripheral vascular disease (PVD) is a disease of the blood vessels.  In most cases, PVD narrows the blood vessels that carry blood from your heart to the rest of your body.  PVD may cause different symptoms. Your symptoms depend on what part of your body is not getting enough blood.  Treatment for PVD depends on the cause of your condition and how severe your symptoms are. This information is not intended to replace advice given to you by your   health care provider. Make sure you discuss any questions you have with your health care provider. Document Revised: 03/06/2017 Document Reviewed: 05/01/2016 Elsevier Patient Education  [place].  Diabetes Mellitus and [place] care is an important part of your health, especially when you have diabetes. Diabetes may cause you to have problems because of poor blood flow (circulation) to your feet and legs, which can cause your skin to:  Become thinner and drier.  Break more easily.  Heal more slowly.  Peel and crack. You may also have nerve damage (neuropathy) in your legs and feet, causing decreased feeling in them. This means that you may not notice minor injuries to your feet that could lead to more serious problems. Noticing and addressing any potential problems early is the best way  to prevent future foot problems. How to care for your feet Foot hygiene  Wash your feet daily with warm water and mild soap. Do not use hot water. Then, pat your feet and the areas between your toes until they are completely dry. Do not soak your feet as this can dry your skin.  Trim your toenails straight across. Do not dig under them or around the cuticle. File the edges of your nails with an emery board or nail file.  Apply a moisturizing lotion or petroleum jelly to the skin on your feet and to dry, brittle toenails. Use lotion that does not contain alcohol and is unscented. Do not apply lotion between your toes. Shoes and socks  Wear clean socks or stockings every day. Make sure they are not too tight. Do not wear knee-high stockings since they may decrease blood flow to your legs.  Wear shoes that fit properly and have enough cushioning. Always look in your shoes before you put them on to be sure there are no objects inside.  To break in new shoes, wear them for just a few hours a day. This prevents injuries on your feet. Wounds, scrapes, corns, and calluses  Check your feet daily for blisters, cuts, bruises, sores, and redness. If you cannot see the bottom of your feet, use a mirror or ask someone for help.  Do not cut corns or calluses or try to remove them with medicine.  If you find a minor scrape, cut, or break in the skin on your feet, keep it and the skin around it clean and dry. You may clean these areas with mild soap and water. Do not clean the area with peroxide, alcohol, or iodine.  If you have a wound, scrape, corn, or callus on your foot, look at it several times a day to make sure it is healing and not infected. Check for: ? Redness, swelling, or pain. ? Fluid or blood. ? Warmth. ? Pus or a bad smell. General instructions  Do not cross your legs. This may decrease blood flow to your feet.  Do not use heating pads or hot water bottles on your feet. They may burn  your skin. If you have lost feeling in your feet or legs, you may not know this is happening until it is too late.  Protect your feet from hot and cold by wearing shoes, such as at the beach or on hot pavement.  Schedule a complete foot exam at least once a year (annually) or more often if you have foot problems. If you have foot problems, report any cuts, sores, or bruises to your health care provider immediately. Contact a health care provider if:  You have a medical condition that increases your risk of infection and you have any cuts, sores, or bruises on your feet.  You have an injury that is not healing.  You have redness on your legs or feet.  You feel burning or tingling in your legs or feet.  You have pain or cramps in your legs and feet.  Your legs or feet are numb.  Your feet always feel cold.  You have pain around a toenail. Get help right away if:  You have a wound, scrape, corn, or callus on your foot and: ? You have pain, swelling, or redness that gets worse. ? You have fluid or blood coming from the wound, scrape, corn, or callus. ? Your wound, scrape, corn, or callus feels warm to the touch. ? You have pus or a bad smell coming from the wound, scrape, corn, or callus. ? You have a fever. ? You have a red line going up your leg. Summary  Check your feet every day for cuts, sores, red spots, swelling, and blisters.  Moisturize feet and legs daily.  Wear shoes that fit properly and have enough cushioning.  If you have foot problems, report any cuts, sores, or bruises to your health care provider immediately.  Schedule a complete foot exam at least once a year (annually) or more often if you have foot problems. This information is not intended to replace advice given to you by your health care provider. Make sure you discuss any questions you have with your health care provider. Document Revised: 12/15/2018 Document Reviewed: 04/25/2016 Elsevier Patient  Education  [place].

## 2019-05-27 NOTE — Telephone Encounter (Signed)
Mailed to pt home address the contact information for pt to self schedule Nuc Med appt.

## 2019-06-01 NOTE — Progress Notes (Signed)
Subjective: Zachary Hoffman presents today for follow up of preventative diabetic foot care and painful mycotic nails b/l that are difficult to trim. Pain interferes with ambulation. Aggravating factors include wearing enclosed shoe gear. Pain is relieved with periodic professional debridement.   He has h/o PAD with fem-fem bypass in [place]. He is followed by [facility]. Last ABI/Arterial USD was September, 2020.  He is requesting diabetic shoes on today's visit. He did recently find a pair at [facility] and is wearing those today.   He voices no new pedal problems on today's visit.  No Known Allergies   Objective: There were no vitals filed for this visit.  Vascular Examination:  Capillary fill time to digits <3s b/l, faintly palpable DP pulses b/l, faintly palpable PT pulses b/l, pedal hair absent b/l and skin temperature gradient within normal limits b/l  Dermatological Examination: Pedal skin with normal turgor, texture and tone bilaterally, no open wounds bilaterally, no interdigital macerations bilaterally, toenails 1-5 b/l elongated, dystrophic, thickened, crumbly with subungual debris and hyperkeratotic lesion(s) b/l heels, submet head 5 b/l and plantarlateral right foot.  No erythema, no edema, no drainage, no flocculence.  Musculoskeletal: Normal muscle strength 5/5 to all lower extremity muscle groups bilaterally, no pain crepitus or joint limitation noted with ROM b/l and mild, flexible hammertoe deformity lesser digits  Neurological: Protective sensation decreased with 10 gram monofilament b/l   Arterial Doppler/ABI report from 12/26/2018 LOWER EXTREMITY DOPPLER STUDY   Indications: Peripheral artery disease, and Patient denies any  claudication        symptoms and no rest pain.   High Risk Factors: Hypertension, hyperlipidemia, Diabetes, current smoker,           coronary artery disease.    Vascular Interventions: In 2009 he had a left proximal  superficial  femoral to             distal superficial femoral artery bypass.   Comparison Study: In 12/2017, an arterial Doppler showed a right ABI of  1.10 and          1.08 on the left.   Performing Technologist: Wilkie Aye RVT     Examination Guidelines: A complete evaluation includes at minimum, Doppler  waveform signals and systolic blood pressure reading at the level of  bilateral  brachial, anterior tibial, and posterior tibial arteries, when vessel  segments  are accessible. Bilateral testing is considered an integral part of a  complete  examination. Photoelectric Plethysmograph (PPG) waveforms and toe systolic  pressure readings are included as required and additional duplex testing  as  needed. Limited examinations for reoccurring indications may be performed  as  noted.     ABI Findings:  +---------+------------------+-----+--------+--------+  Right  Rt Pressure (mmHg)IndexWaveformComment   +---------+------------------+-----+--------+--------+  Brachial 150                     +---------+------------------+-----+--------+--------+  ATA   168        1.11 biphasic      +---------+------------------+-----+--------+--------+  PTA   156        1.03 biphasic      +---------+------------------+-----+--------+--------+  PERO   156        1.03 biphasic      +---------+------------------+-----+--------+--------+  Great Toe111        0.74 Normal       +---------+------------------+-----+--------+--------+   +---------+------------------+-----+--------+-------+  Left   Lt Pressure (mmHg)IndexWaveformComment  +---------+------------------+-----+--------+-------+  Brachial 151                     +---------+------------------+-----+--------+-------+  ATA   156        1.03 biphasic       +---------+------------------+-----+--------+-------+  PTA   149        0.99 biphasic      +---------+------------------+-----+--------+-------+  PERO   156        1.03 biphasic      +---------+------------------+-----+--------+-------+  Great Toe99        0.66 Normal       +---------+------------------+-----+--------+-------+   +-------+-----------+-----------+------------+------------+  ABI/TBIToday's ABIToday's TBIPrevious ABIPrevious TBI  +-------+-----------+-----------+------------+------------+  Right 1.11    0.74    1.10    0.67      +-------+-----------+-----------+------------+------------+  Left  1.03    0.66    1.08    0.76      +-------+-----------+-----------+------------+------------+     Bilateral ABIs appear essentially unchanged compared to prior study on  12/2017.    Summary:  Right: The right toe-brachial index is normal.  Although ankle brachial indices are within normal limits (0.95-1.29),  arterial Doppler waveforms at the ankle suggest some component of arterial  occlusive disease.  Left: Resting left ankle-brachial index is within normal range. No  evidence of significant left lower extremity arterial disease. The left  toe-brachial index is abnormal.      *See table(s) above for measurements and observations.    Suggest follow up study in 12 months.   Electronically signed by Jenkins Rouge MD on 12/21/2018 at 4:06:35 PM.       Final    Assessment: 1. Pain due to onychomycosis of toenail   2. Callus   3. 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 -Continue diabetic foot care principles. Literature dispensed on today.  -Advised him to discuss diabetic  shoes with Dr. Dwyane Dee. -Toenails 1-5 b/l were debrided in length and girth with sterile nail nippers and dremel without iatrogenic bleeding. -Patient to continue soft, supportive shoe gear daily. -Patient to report any pedal injuries to medical professional immediately. -Patient/POA to call should there be question/concern in the interim.  Return in about 3 months (around 08/24/2019) for diabetic nail and callus trim.

## 2019-06-07 ENCOUNTER — Observation Stay (HOSPITAL_COMMUNITY): Payer: No Typology Code available for payment source

## 2019-06-07 ENCOUNTER — Emergency Department (HOSPITAL_COMMUNITY): Payer: No Typology Code available for payment source

## 2019-06-07 ENCOUNTER — Other Ambulatory Visit: Payer: Self-pay

## 2019-06-07 ENCOUNTER — Encounter (HOSPITAL_COMMUNITY): Payer: Medicare Other

## 2019-06-07 ENCOUNTER — Inpatient Hospital Stay (HOSPITAL_COMMUNITY)
Admission: EM | Admit: 2019-06-07 | Discharge: 2019-07-07 | DRG: 471 | Disposition: E | Payer: No Typology Code available for payment source | Attending: Neurological Surgery | Admitting: Neurological Surgery

## 2019-06-07 ENCOUNTER — Encounter (HOSPITAL_COMMUNITY): Payer: Self-pay | Admitting: Student

## 2019-06-07 DIAGNOSIS — E785 Hyperlipidemia, unspecified: Secondary | ICD-10-CM | POA: Diagnosis present

## 2019-06-07 DIAGNOSIS — S12001A Unspecified nondisplaced fracture of first cervical vertebra, initial encounter for closed fracture: Secondary | ICD-10-CM | POA: Diagnosis not present

## 2019-06-07 DIAGNOSIS — Z981 Arthrodesis status: Secondary | ICD-10-CM

## 2019-06-07 DIAGNOSIS — M25561 Pain in right knee: Secondary | ICD-10-CM

## 2019-06-07 DIAGNOSIS — M438X2 Other specified deforming dorsopathies, cervical region: Secondary | ICD-10-CM | POA: Diagnosis present

## 2019-06-07 DIAGNOSIS — J9601 Acute respiratory failure with hypoxia: Secondary | ICD-10-CM | POA: Diagnosis not present

## 2019-06-07 DIAGNOSIS — T68XXXA Hypothermia, initial encounter: Secondary | ICD-10-CM | POA: Diagnosis not present

## 2019-06-07 DIAGNOSIS — I469 Cardiac arrest, cause unspecified: Secondary | ICD-10-CM | POA: Diagnosis not present

## 2019-06-07 DIAGNOSIS — M79642 Pain in left hand: Secondary | ICD-10-CM | POA: Diagnosis present

## 2019-06-07 DIAGNOSIS — J9690 Respiratory failure, unspecified, unspecified whether with hypoxia or hypercapnia: Secondary | ICD-10-CM

## 2019-06-07 DIAGNOSIS — Z951 Presence of aortocoronary bypass graft: Secondary | ICD-10-CM

## 2019-06-07 DIAGNOSIS — S12400A Unspecified displaced fracture of fifth cervical vertebra, initial encounter for closed fracture: Principal | ICD-10-CM | POA: Diagnosis present

## 2019-06-07 DIAGNOSIS — I6502 Occlusion and stenosis of left vertebral artery: Secondary | ICD-10-CM | POA: Diagnosis present

## 2019-06-07 DIAGNOSIS — I2510 Atherosclerotic heart disease of native coronary artery without angina pectoris: Secondary | ICD-10-CM | POA: Diagnosis present

## 2019-06-07 DIAGNOSIS — Z79899 Other long term (current) drug therapy: Secondary | ICD-10-CM

## 2019-06-07 DIAGNOSIS — F419 Anxiety disorder, unspecified: Secondary | ICD-10-CM | POA: Diagnosis not present

## 2019-06-07 DIAGNOSIS — T799XXA Unspecified early complication of trauma, initial encounter: Secondary | ICD-10-CM

## 2019-06-07 DIAGNOSIS — Z4659 Encounter for fitting and adjustment of other gastrointestinal appliance and device: Secondary | ICD-10-CM

## 2019-06-07 DIAGNOSIS — M50323 Other cervical disc degeneration at C6-C7 level: Secondary | ICD-10-CM | POA: Diagnosis present

## 2019-06-07 DIAGNOSIS — S12000A Unspecified displaced fracture of first cervical vertebra, initial encounter for closed fracture: Secondary | ICD-10-CM | POA: Diagnosis present

## 2019-06-07 DIAGNOSIS — N184 Chronic kidney disease, stage 4 (severe): Secondary | ICD-10-CM | POA: Diagnosis present

## 2019-06-07 DIAGNOSIS — M4800 Spinal stenosis, site unspecified: Secondary | ICD-10-CM | POA: Diagnosis present

## 2019-06-07 DIAGNOSIS — S129XXA Fracture of neck, unspecified, initial encounter: Secondary | ICD-10-CM

## 2019-06-07 DIAGNOSIS — E870 Hyperosmolality and hypernatremia: Secondary | ICD-10-CM | POA: Diagnosis not present

## 2019-06-07 DIAGNOSIS — E1122 Type 2 diabetes mellitus with diabetic chronic kidney disease: Secondary | ICD-10-CM | POA: Diagnosis not present

## 2019-06-07 DIAGNOSIS — R918 Other nonspecific abnormal finding of lung field: Secondary | ICD-10-CM | POA: Diagnosis not present

## 2019-06-07 DIAGNOSIS — Z7189 Other specified counseling: Secondary | ICD-10-CM

## 2019-06-07 DIAGNOSIS — Z8781 Personal history of (healed) traumatic fracture: Secondary | ICD-10-CM

## 2019-06-07 DIAGNOSIS — D62 Acute posthemorrhagic anemia: Secondary | ICD-10-CM | POA: Diagnosis not present

## 2019-06-07 DIAGNOSIS — Z66 Do not resuscitate: Secondary | ICD-10-CM | POA: Diagnosis not present

## 2019-06-07 DIAGNOSIS — Z515 Encounter for palliative care: Secondary | ICD-10-CM | POA: Diagnosis not present

## 2019-06-07 DIAGNOSIS — S12110A Anterior displaced Type II dens fracture, initial encounter for closed fracture: Secondary | ICD-10-CM | POA: Diagnosis present

## 2019-06-07 DIAGNOSIS — M79641 Pain in right hand: Secondary | ICD-10-CM | POA: Diagnosis present

## 2019-06-07 DIAGNOSIS — R000 Tachycardia, unspecified: Secondary | ICD-10-CM

## 2019-06-07 DIAGNOSIS — Z955 Presence of coronary angioplasty implant and graft: Secondary | ICD-10-CM

## 2019-06-07 DIAGNOSIS — Z20822 Contact with and (suspected) exposure to covid-19: Secondary | ICD-10-CM | POA: Diagnosis present

## 2019-06-07 DIAGNOSIS — R1310 Dysphagia, unspecified: Secondary | ICD-10-CM | POA: Diagnosis not present

## 2019-06-07 DIAGNOSIS — Z539 Procedure and treatment not carried out, unspecified reason: Secondary | ICD-10-CM | POA: Diagnosis present

## 2019-06-07 DIAGNOSIS — S82141A Displaced bicondylar fracture of right tibia, initial encounter for closed fracture: Secondary | ICD-10-CM | POA: Diagnosis present

## 2019-06-07 DIAGNOSIS — E1151 Type 2 diabetes mellitus with diabetic peripheral angiopathy without gangrene: Secondary | ICD-10-CM | POA: Diagnosis present

## 2019-06-07 DIAGNOSIS — R52 Pain, unspecified: Secondary | ICD-10-CM

## 2019-06-07 DIAGNOSIS — I10 Essential (primary) hypertension: Secondary | ICD-10-CM

## 2019-06-07 DIAGNOSIS — I493 Ventricular premature depolarization: Secondary | ICD-10-CM | POA: Diagnosis not present

## 2019-06-07 DIAGNOSIS — S12100A Unspecified displaced fracture of second cervical vertebra, initial encounter for closed fracture: Secondary | ICD-10-CM | POA: Diagnosis present

## 2019-06-07 DIAGNOSIS — E1110 Type 2 diabetes mellitus with ketoacidosis without coma: Secondary | ICD-10-CM | POA: Diagnosis present

## 2019-06-07 DIAGNOSIS — K5900 Constipation, unspecified: Secondary | ICD-10-CM | POA: Diagnosis not present

## 2019-06-07 DIAGNOSIS — R1110 Vomiting, unspecified: Secondary | ICD-10-CM | POA: Diagnosis not present

## 2019-06-07 DIAGNOSIS — I5032 Chronic diastolic (congestive) heart failure: Secondary | ICD-10-CM | POA: Diagnosis present

## 2019-06-07 DIAGNOSIS — Z978 Presence of other specified devices: Secondary | ICD-10-CM

## 2019-06-07 DIAGNOSIS — I130 Hypertensive heart and chronic kidney disease with heart failure and stage 1 through stage 4 chronic kidney disease, or unspecified chronic kidney disease: Secondary | ICD-10-CM | POA: Diagnosis present

## 2019-06-07 DIAGNOSIS — Y92410 Unspecified street and highway as the place of occurrence of the external cause: Secondary | ICD-10-CM

## 2019-06-07 DIAGNOSIS — R4020 Unspecified coma: Secondary | ICD-10-CM | POA: Diagnosis not present

## 2019-06-07 DIAGNOSIS — E875 Hyperkalemia: Secondary | ICD-10-CM | POA: Diagnosis not present

## 2019-06-07 DIAGNOSIS — F17210 Nicotine dependence, cigarettes, uncomplicated: Secondary | ICD-10-CM | POA: Diagnosis present

## 2019-06-07 DIAGNOSIS — Z9911 Dependence on respirator [ventilator] status: Secondary | ICD-10-CM

## 2019-06-07 DIAGNOSIS — Z794 Long term (current) use of insulin: Secondary | ICD-10-CM

## 2019-06-07 DIAGNOSIS — R339 Retention of urine, unspecified: Secondary | ICD-10-CM | POA: Diagnosis not present

## 2019-06-07 DIAGNOSIS — I471 Supraventricular tachycardia: Secondary | ICD-10-CM | POA: Diagnosis not present

## 2019-06-07 DIAGNOSIS — I63533 Cerebral infarction due to unspecified occlusion or stenosis of bilateral posterior cerebral arteries: Secondary | ICD-10-CM | POA: Diagnosis not present

## 2019-06-07 DIAGNOSIS — R451 Restlessness and agitation: Secondary | ICD-10-CM | POA: Diagnosis not present

## 2019-06-07 LAB — CBC
HCT: 33.5 % — ABNORMAL LOW (ref 39.0–52.0)
Hemoglobin: 10.2 g/dL — ABNORMAL LOW (ref 13.0–17.0)
MCH: 27.3 pg (ref 26.0–34.0)
MCHC: 30.4 g/dL (ref 30.0–36.0)
MCV: 89.6 fL (ref 80.0–100.0)
Platelets: 270 10*3/uL (ref 150–400)
RBC: 3.74 MIL/uL — ABNORMAL LOW (ref 4.22–5.81)
RDW: 15.9 % — ABNORMAL HIGH (ref 11.5–15.5)
WBC: 4.8 10*3/uL (ref 4.0–10.5)
nRBC: 0 % (ref 0.0–0.2)

## 2019-06-07 LAB — COMPREHENSIVE METABOLIC PANEL
ALT: 22 U/L (ref 0–44)
AST: 27 U/L (ref 15–41)
Albumin: 3.3 g/dL — ABNORMAL LOW (ref 3.5–5.0)
Alkaline Phosphatase: 96 U/L (ref 38–126)
Anion gap: 12 (ref 5–15)
BUN: 27 mg/dL — ABNORMAL HIGH (ref 8–23)
CO2: 16 mmol/L — ABNORMAL LOW (ref 22–32)
Calcium: 8.9 mg/dL (ref 8.9–10.3)
Chloride: 107 mmol/L (ref 98–111)
Creatinine, Ser: 2.81 mg/dL — ABNORMAL HIGH (ref 0.61–1.24)
GFR calc Af Amer: 24 mL/min — ABNORMAL LOW (ref >60)
GFR calc non Af Amer: 21 mL/min — ABNORMAL LOW (ref >60)
Glucose, Bld: 313 mg/dL — ABNORMAL HIGH (ref 70–99)
Potassium: 3.9 mmol/L (ref 3.5–5.1)
Sodium: 135 mmol/L (ref 135–145)
Total Bilirubin: 0.7 mg/dL (ref 0.3–1.2)
Total Protein: 6.8 g/dL (ref 6.5–8.1)

## 2019-06-07 LAB — GLUCOSE, CAPILLARY: Glucose-Capillary: 352 mg/dL — ABNORMAL HIGH (ref 70–99)

## 2019-06-07 LAB — LACTIC ACID, PLASMA: Lactic Acid, Venous: 1.8 mmol/L (ref 0.5–1.9)

## 2019-06-07 LAB — I-STAT CHEM 8, ED
BUN: 26 mg/dL — ABNORMAL HIGH (ref 8–23)
Calcium, Ion: 1.16 mmol/L (ref 1.15–1.40)
Chloride: 110 mmol/L (ref 98–111)
Creatinine, Ser: 2.8 mg/dL — ABNORMAL HIGH (ref 0.61–1.24)
Glucose, Bld: 305 mg/dL — ABNORMAL HIGH (ref 70–99)
HCT: 32 % — ABNORMAL LOW (ref 39.0–52.0)
Hemoglobin: 10.9 g/dL — ABNORMAL LOW (ref 13.0–17.0)
Potassium: 3.9 mmol/L (ref 3.5–5.1)
Sodium: 137 mmol/L (ref 135–145)
TCO2: 18 mmol/L — ABNORMAL LOW (ref 22–32)

## 2019-06-07 LAB — CDS SEROLOGY

## 2019-06-07 LAB — RESPIRATORY PANEL BY RT PCR (FLU A&B, COVID)
Influenza A by PCR: NEGATIVE
Influenza B by PCR: NEGATIVE
SARS Coronavirus 2 by RT PCR: NEGATIVE

## 2019-06-07 LAB — HEMOGLOBIN A1C
Hgb A1c MFr Bld: 8.6 % — ABNORMAL HIGH (ref 4.8–5.6)
Mean Plasma Glucose: 200.12 mg/dL

## 2019-06-07 LAB — ETHANOL: Alcohol, Ethyl (B): <10 mg/dL (ref <10)

## 2019-06-07 LAB — PROTIME-INR
INR: 1.1 (ref 0.8–1.2)
Prothrombin Time: 14.5 seconds (ref 11.4–15.2)

## 2019-06-07 MED ORDER — MORPHINE SULFATE (PF) 4 MG/ML IV SOLN
4.0000 mg | Freq: Once | INTRAVENOUS | Status: AC
  Administered 2019-06-07: 4 mg via INTRAVENOUS
  Filled 2019-06-07: qty 1

## 2019-06-07 MED ORDER — ONDANSETRON HCL 4 MG PO TABS: 4.0000 mg | ORAL_TABLET | Freq: Four times a day (QID) | ORAL | Status: DC | PRN

## 2019-06-07 MED ORDER — FENTANYL CITRATE (PF) 100 MCG/2ML IJ SOLN: 50.0000 ug | Freq: Once | INTRAMUSCULAR | Status: DC

## 2019-06-07 MED ORDER — HYDRALAZINE HCL 20 MG/ML IJ SOLN
10.0000 mg | Freq: Four times a day (QID) | INTRAMUSCULAR | Status: DC | PRN
  Administered 2019-06-07 – 2019-06-08 (×2): 10 mg via INTRAVENOUS
  Filled 2019-06-07 (×2): qty 1

## 2019-06-07 MED ORDER — SODIUM CHLORIDE 0.9 % IV SOLN: INTRAVENOUS | Status: DC

## 2019-06-07 MED ORDER — ACETAMINOPHEN 325 MG PO TABS
650.0000 mg | ORAL_TABLET | Freq: Four times a day (QID) | ORAL | Status: DC | PRN
  Administered 2019-06-08: 650 mg via ORAL
  Filled 2019-06-07: qty 2

## 2019-06-07 MED ORDER — FENTANYL CITRATE (PF) 100 MCG/2ML IJ SOLN
50.0000 ug | Freq: Once | INTRAMUSCULAR | Status: AC
  Administered 2019-06-07: 50 ug via INTRAVENOUS
  Filled 2019-06-07: qty 2

## 2019-06-07 MED ORDER — INSULIN ASPART 100 UNIT/ML ~~LOC~~ SOLN
0.0000 [IU] | Freq: Three times a day (TID) | SUBCUTANEOUS | Status: DC
  Administered 2019-06-08: 5 [IU] via SUBCUTANEOUS
  Administered 2019-06-09: 2 [IU] via SUBCUTANEOUS
  Administered 2019-06-09: 5 [IU] via SUBCUTANEOUS
  Administered 2019-06-09: 3 [IU] via SUBCUTANEOUS
  Administered 2019-06-11 (×2): 15 [IU] via SUBCUTANEOUS

## 2019-06-07 MED ORDER — DOCUSATE SODIUM 100 MG PO CAPS
100.0000 mg | ORAL_CAPSULE | Freq: Two times a day (BID) | ORAL | Status: DC
  Administered 2019-06-07 – 2019-06-09 (×5): 100 mg via ORAL
  Filled 2019-06-07 (×5): qty 1

## 2019-06-07 MED ORDER — ONDANSETRON HCL 4 MG/2ML IJ SOLN
4.0000 mg | Freq: Four times a day (QID) | INTRAMUSCULAR | Status: DC | PRN
  Administered 2019-06-07: 4 mg via INTRAVENOUS
  Filled 2019-06-07: qty 2

## 2019-06-07 MED ORDER — SENNA 8.6 MG PO TABS
1.0000 | ORAL_TABLET | Freq: Two times a day (BID) | ORAL | Status: DC
  Administered 2019-06-07 – 2019-06-09 (×5): 8.6 mg via ORAL
  Filled 2019-06-07 (×5): qty 1

## 2019-06-07 MED ORDER — INSULIN GLARGINE 100 UNIT/ML ~~LOC~~ SOLN
10.0000 [IU] | Freq: Every day | SUBCUTANEOUS | Status: DC
  Administered 2019-06-07: 10 [IU] via SUBCUTANEOUS
  Filled 2019-06-07 (×3): qty 0.1

## 2019-06-07 MED ORDER — SODIUM CHLORIDE 0.9 % IV BOLUS
500.0000 mL | Freq: Once | INTRAVENOUS | Status: AC
  Administered 2019-06-07: 500 mL via INTRAVENOUS

## 2019-06-07 MED ORDER — MORPHINE SULFATE (PF) 2 MG/ML IV SOLN
1.0000 mg | INTRAVENOUS | Status: DC | PRN
  Administered 2019-06-08 – 2019-06-10 (×6): 2 mg via INTRAVENOUS
  Filled 2019-06-07 (×6): qty 1

## 2019-06-07 MED ORDER — MORPHINE SULFATE (PF) 4 MG/ML IV SOLN
4.0000 mg | Freq: Once | INTRAVENOUS | Status: AC
  Administered 2019-06-07: 22:00:00 4 mg via INTRAVENOUS

## 2019-06-07 MED ORDER — MORPHINE SULFATE (PF) 4 MG/ML IV SOLN
4.0000 mg | INTRAVENOUS | Status: DC | PRN
  Administered 2019-06-07 (×2): 4 mg via INTRAVENOUS
  Filled 2019-06-07 (×3): qty 1

## 2019-06-07 MED ORDER — CYCLOBENZAPRINE HCL 10 MG PO TABS
10.0000 mg | ORAL_TABLET | Freq: Three times a day (TID) | ORAL | Status: DC | PRN
  Administered 2019-06-07 – 2019-06-09 (×3): 10 mg via ORAL
  Filled 2019-06-07 (×3): qty 1

## 2019-06-07 MED ORDER — ACETAMINOPHEN 650 MG RE SUPP: 650.0000 mg | Freq: Four times a day (QID) | RECTAL | Status: DC | PRN

## 2019-06-07 MED ORDER — INSULIN ASPART 100 UNIT/ML ~~LOC~~ SOLN
0.0000 [IU] | Freq: Every day | SUBCUTANEOUS | Status: DC
  Administered 2019-06-07: 5 [IU] via SUBCUTANEOUS
  Administered 2019-06-10: 21:00:00 2 [IU] via SUBCUTANEOUS

## 2019-06-07 MED ORDER — HYDROCODONE-ACETAMINOPHEN 5-325 MG PO TABS
1.0000 | ORAL_TABLET | ORAL | Status: DC | PRN
  Administered 2019-06-07 – 2019-06-08 (×2): 2 via ORAL
  Administered 2019-06-08: 1 via ORAL
  Administered 2019-06-08 (×2): 2 via ORAL
  Administered 2019-06-09 (×2): 1 via ORAL
  Filled 2019-06-07: qty 2
  Filled 2019-06-07: qty 1
  Filled 2019-06-07 (×2): qty 2
  Filled 2019-06-07 (×2): qty 1
  Filled 2019-06-07: qty 2

## 2019-06-07 NOTE — ED Notes (Signed)
Pt transported to MRI 
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
## 2019-06-07 NOTE — ED Notes (Signed)
This RN attempted to sit pt up in bed, pt could only tolerate 20 degrees due to severe pain in his neck, EDP made aware. Pt also still c.o left hand pain, ice pack placed for comfort.

## 2019-06-07 NOTE — ED Notes (Signed)
Pt went to MRI.

## 2019-06-07 NOTE — ED Notes (Signed)
Pt to CT

## 2019-06-07 NOTE — ED Provider Notes (Signed)
Blood pressure (!) 151/55, pulse 97, temperature 97.6 F (36.4 C), temperature source Temporal, resp. rate 16, height 5\' 7"  (1.702 m), weight 78 kg, SpO2 100 %.  Assuming care from Dr. Regenia Skeeter.  In short, Zachary Hoffman is a 79 y.o. male with a chief complaint of No chief complaint on file. Marland Kitchen  Refer to the original H&P for additional details.  The current plan of care is to f/u after pain control meds and ambulation here in the ED.  04:35 PM  Patient attempted to ambulate.  He sat up in bed and was unable to go any further due to severe pain in his neck.  Also complaining of hand pain with normal x-ray.  Spoke with neurosurgery who are okay with consult but requesting medicine admission for pain control and management of the patient's underlying medical issues.   Spoke with [facility], Dr. Doristine Bosworth who will consult on the patient for med mgmt. NSG to admit.   Discussed patient's case with Neurosurgery to request admission. Patient and family (if present) updated with plan. Care transferred to Neurosurgery service.  I reviewed all nursing notes, vitals, pertinent old records, EKGs, labs, imaging (as available).     Margette Fast, MD 06/20/2019 [id number]

## 2019-06-07 NOTE — ED Notes (Signed)
Pt in xray
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
## 2019-06-07 NOTE — ED Notes (Signed)
Aspen collar applied
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
## 2019-06-08 ENCOUNTER — Observation Stay (HOSPITAL_COMMUNITY): Payer: No Typology Code available for payment source

## 2019-06-08 DIAGNOSIS — Z66 Do not resuscitate: Secondary | ICD-10-CM | POA: Diagnosis not present

## 2019-06-08 DIAGNOSIS — S12001A Unspecified nondisplaced fracture of first cervical vertebra, initial encounter for closed fracture: Secondary | ICD-10-CM | POA: Diagnosis not present

## 2019-06-08 DIAGNOSIS — I130 Hypertensive heart and chronic kidney disease with heart failure and stage 1 through stage 4 chronic kidney disease, or unspecified chronic kidney disease: Secondary | ICD-10-CM | POA: Diagnosis present

## 2019-06-08 DIAGNOSIS — Y92410 Unspecified street and highway as the place of occurrence of the external cause: Secondary | ICD-10-CM | POA: Diagnosis not present

## 2019-06-08 DIAGNOSIS — Z515 Encounter for palliative care: Secondary | ICD-10-CM | POA: Diagnosis not present

## 2019-06-08 DIAGNOSIS — S12000A Unspecified displaced fracture of first cervical vertebra, initial encounter for closed fracture: Secondary | ICD-10-CM | POA: Diagnosis present

## 2019-06-08 DIAGNOSIS — S12400A Unspecified displaced fracture of fifth cervical vertebra, initial encounter for closed fracture: Secondary | ICD-10-CM | POA: Diagnosis present

## 2019-06-08 DIAGNOSIS — R4020 Unspecified coma: Secondary | ICD-10-CM | POA: Diagnosis not present

## 2019-06-08 DIAGNOSIS — M438X2 Other specified deforming dorsopathies, cervical region: Secondary | ICD-10-CM | POA: Diagnosis present

## 2019-06-08 DIAGNOSIS — E870 Hyperosmolality and hypernatremia: Secondary | ICD-10-CM | POA: Diagnosis not present

## 2019-06-08 DIAGNOSIS — I63533 Cerebral infarction due to unspecified occlusion or stenosis of bilateral posterior cerebral arteries: Secondary | ICD-10-CM | POA: Diagnosis not present

## 2019-06-08 DIAGNOSIS — Z539 Procedure and treatment not carried out, unspecified reason: Secondary | ICD-10-CM | POA: Diagnosis present

## 2019-06-08 DIAGNOSIS — Z7189 Other specified counseling: Secondary | ICD-10-CM | POA: Diagnosis not present

## 2019-06-08 DIAGNOSIS — S12100A Unspecified displaced fracture of second cervical vertebra, initial encounter for closed fracture: Secondary | ICD-10-CM | POA: Diagnosis present

## 2019-06-08 DIAGNOSIS — S12110A Anterior displaced Type II dens fracture, initial encounter for closed fracture: Secondary | ICD-10-CM | POA: Diagnosis present

## 2019-06-08 DIAGNOSIS — Z20822 Contact with and (suspected) exposure to covid-19: Secondary | ICD-10-CM | POA: Diagnosis present

## 2019-06-08 DIAGNOSIS — S82141A Displaced bicondylar fracture of right tibia, initial encounter for closed fracture: Secondary | ICD-10-CM | POA: Diagnosis present

## 2019-06-08 DIAGNOSIS — Z9911 Dependence on respirator [ventilator] status: Secondary | ICD-10-CM | POA: Diagnosis not present

## 2019-06-08 DIAGNOSIS — E785 Hyperlipidemia, unspecified: Secondary | ICD-10-CM | POA: Diagnosis present

## 2019-06-08 DIAGNOSIS — I5032 Chronic diastolic (congestive) heart failure: Secondary | ICD-10-CM | POA: Diagnosis present

## 2019-06-08 DIAGNOSIS — J9601 Acute respiratory failure with hypoxia: Secondary | ICD-10-CM | POA: Diagnosis not present

## 2019-06-08 DIAGNOSIS — M50323 Other cervical disc degeneration at C6-C7 level: Secondary | ICD-10-CM | POA: Diagnosis present

## 2019-06-08 DIAGNOSIS — I471 Supraventricular tachycardia: Secondary | ICD-10-CM | POA: Diagnosis not present

## 2019-06-08 DIAGNOSIS — N184 Chronic kidney disease, stage 4 (severe): Secondary | ICD-10-CM | POA: Diagnosis present

## 2019-06-08 DIAGNOSIS — E1122 Type 2 diabetes mellitus with diabetic chronic kidney disease: Secondary | ICD-10-CM | POA: Diagnosis present

## 2019-06-08 DIAGNOSIS — D62 Acute posthemorrhagic anemia: Secondary | ICD-10-CM | POA: Diagnosis not present

## 2019-06-08 DIAGNOSIS — I10 Essential (primary) hypertension: Secondary | ICD-10-CM | POA: Diagnosis not present

## 2019-06-08 DIAGNOSIS — R1110 Vomiting, unspecified: Secondary | ICD-10-CM | POA: Diagnosis not present

## 2019-06-08 DIAGNOSIS — E1110 Type 2 diabetes mellitus with ketoacidosis without coma: Secondary | ICD-10-CM | POA: Diagnosis present

## 2019-06-08 LAB — CBC
HCT: 30.3 % — ABNORMAL LOW (ref 39.0–52.0)
Hemoglobin: 9.7 g/dL — ABNORMAL LOW (ref 13.0–17.0)
MCH: 27.3 pg (ref 26.0–34.0)
MCHC: 32 g/dL (ref 30.0–36.0)
MCV: 85.4 fL (ref 80.0–100.0)
Platelets: 279 10*3/uL (ref 150–400)
RBC: 3.55 MIL/uL — ABNORMAL LOW (ref 4.22–5.81)
RDW: 15.8 % — ABNORMAL HIGH (ref 11.5–15.5)
WBC: 4.7 10*3/uL (ref 4.0–10.5)
nRBC: 0 % (ref 0.0–0.2)

## 2019-06-08 LAB — BASIC METABOLIC PANEL
Anion gap: 11 (ref 5–15)
BUN: 28 mg/dL — ABNORMAL HIGH (ref 8–23)
CO2: 18 mmol/L — ABNORMAL LOW (ref 22–32)
Calcium: 9.1 mg/dL (ref 8.9–10.3)
Chloride: 108 mmol/L (ref 98–111)
Creatinine, Ser: 2.51 mg/dL — ABNORMAL HIGH (ref 0.61–1.24)
GFR calc Af Amer: 28 mL/min — ABNORMAL LOW (ref >60)
GFR calc non Af Amer: 24 mL/min — ABNORMAL LOW (ref >60)
Glucose, Bld: 250 mg/dL — ABNORMAL HIGH (ref 70–99)
Potassium: 4.7 mmol/L (ref 3.5–5.1)
Sodium: 137 mmol/L (ref 135–145)

## 2019-06-08 LAB — GLUCOSE, CAPILLARY
Glucose-Capillary: 104 mg/dL — ABNORMAL HIGH (ref 70–99)
Glucose-Capillary: 104 mg/dL — ABNORMAL HIGH (ref 70–99)
Glucose-Capillary: 193 mg/dL — ABNORMAL HIGH (ref 70–99)
Glucose-Capillary: 249 mg/dL — ABNORMAL HIGH (ref 70–99)

## 2019-06-08 LAB — RAPID URINE DRUG SCREEN, HOSP PERFORMED
Amphetamines: NOT DETECTED
Barbiturates: NOT DETECTED
Benzodiazepines: NOT DETECTED
Cocaine: NOT DETECTED
Opiates: POSITIVE — AB
Tetrahydrocannabinol: NOT DETECTED

## 2019-06-08 LAB — URINALYSIS, ROUTINE W REFLEX MICROSCOPIC
Bilirubin Urine: NEGATIVE
Glucose, UA: 150 mg/dL — AB
Ketones, ur: NEGATIVE mg/dL
Leukocytes,Ua: NEGATIVE
Nitrite: NEGATIVE
Protein, ur: 100 mg/dL — AB
Specific Gravity, Urine: 1.012 (ref 1.005–1.030)
pH: 5 (ref 5.0–8.0)

## 2019-06-08 LAB — SAMPLE TO BLOOD BANK

## 2019-06-08 MED ORDER — AMLODIPINE BESYLATE 10 MG PO TABS
10.0000 mg | ORAL_TABLET | Freq: Every day | ORAL | Status: DC
  Administered 2019-06-08 – 2019-06-11 (×3): 10 mg via ORAL
  Filled 2019-06-08 (×2): qty 1
  Filled 2019-06-08: qty 2

## 2019-06-08 MED ORDER — DOXAZOSIN MESYLATE 8 MG PO TABS
8.0000 mg | ORAL_TABLET | Freq: Every day | ORAL | Status: DC
  Administered 2019-06-08 – 2019-06-11 (×3): 8 mg via ORAL
  Filled 2019-06-08 (×4): qty 1

## 2019-06-08 MED ORDER — PANTOPRAZOLE SODIUM 40 MG PO TBEC
40.0000 mg | DELAYED_RELEASE_TABLET | Freq: Every day | ORAL | Status: DC
  Administered 2019-06-08 – 2019-06-11 (×3): 40 mg via ORAL
  Filled 2019-06-08 (×4): qty 1

## 2019-06-08 MED ORDER — TIZANIDINE HCL 4 MG PO TABS: 4.0000 mg | ORAL_TABLET | Freq: Three times a day (TID) | ORAL | Status: DC | PRN

## 2019-06-08 MED ORDER — INSULIN GLARGINE 100 UNIT/ML ~~LOC~~ SOLN
20.0000 [IU] | Freq: Every day | SUBCUTANEOUS | Status: DC
  Administered 2019-06-08 – 2019-06-10 (×3): 20 [IU] via SUBCUTANEOUS
  Filled 2019-06-08 (×4): qty 0.2

## 2019-06-08 MED ORDER — HYDRALAZINE HCL 50 MG PO TABS
50.0000 mg | ORAL_TABLET | Freq: Two times a day (BID) | ORAL | Status: DC
  Administered 2019-06-08 – 2019-06-11 (×5): 50 mg via ORAL
  Filled 2019-06-08: qty 5
  Filled 2019-06-08 (×4): qty 1

## 2019-06-08 MED ORDER — SIMVASTATIN 20 MG PO TABS
20.0000 mg | ORAL_TABLET | Freq: Every day | ORAL | Status: DC
  Administered 2019-06-08 – 2019-06-09 (×2): 20 mg via ORAL
  Filled 2019-06-08 (×2): qty 1

## 2019-06-08 MED ORDER — FUROSEMIDE 40 MG PO TABS
40.0000 mg | ORAL_TABLET | Freq: Every day | ORAL | Status: DC
  Administered 2019-06-08 – 2019-06-11 (×3): 40 mg via ORAL
  Filled 2019-06-08 (×3): qty 1
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
## 2019-06-08 NOTE — Progress Notes (Signed)
Patient ID: Zachary Hoffman, male   DOB: [birth year], 79 y.o.   MRN: [id number] I have reviewed the patient's CT scan and MRI of the cervical spine.  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   CT scan shows severe cervical spondylosis, the acute findings are a type II or type III odontoid fracture (it does curve down into the body somewhat), and a Jefferson fracture of C1 and a Chance fracture through the top of C5 with some opening of the anterior part of the disc space at this level.  I suspect this is unstable.  By the cervical spine does show severe spinal stenosis at multiple levels but this is a chronic old finding.  I suspect even the signal change in the cord at C6-7 is probably old.  He does have pain in his left hand that could be neuropathic.  It could be traumatic.  He does have an injury to his right knee evaluated by orthopedic surgery.  Appears he has a tibial plateau fracture laterally.  He has poor renal function with a creatinine of 2.8.  I think he is going to have to have C4-5 stabilized.  I really considered every options such as simple ACDF at C4-5 with anterior cervical plating to simply stabilize the segment, and then treat him in a collar to hope that the C1 and C2 fractures will heal in the collar.  If it acts like a type III odontoid there is a decent chance it will heal in the collar.,  And I think he would need a posterior decompression and instrumented fusion from C3-C7 or T1 to treat the spinal stenosis that is more chronic and longstanding.  I have considered an occiput to T1 decompression and fusion to address all of the fractures and the spinal stenosis at one time.  This is a large surgery and one that is potentially difficult for him to tolerate at his age and medical condition.  I think the risks of this surgery make it less attractive to me.  Considered ACDF with plating at C3-4 C4-5 and C6-7 to address the spinal stenosis and go  ahead and stabilize him upfront, and then consider a posterior operation in the future, especially if the C1 and C2 fractures do not heal adequately.  However, I am worried that the C3-C5 construct will put undue forces on the C2 fracture trying to heal, and I think an ACDF with plating at C6-7 given the moth-eaten appearance of the disc space and the collapsed nature of that disc space would have a high risk of pseudoarthrosis and poor healing and would be a difficult decompression as there is no significant disc space at that level at this time.  When I consider all of this, I think that I should simply address the instability at C4-5 with a simple C4-5 ACDF with plating and leave the more chronic stenosis alone for now, and plan on a delayed posterior cervical decompression instrumented fusion, levels depending on the healing of the C1 and C2 fractures.  He does have a left vertebral artery injury but there is not much I can do about that at this time.  Because of the large prevertebral hematoma in the fractures of the cervical spine I am not comfortable with anticoagulation.  I do not want to give him aspirin or antiplatelet agents because he is going to need surgery in the very near future.  He may plan on doing this about  2 days from now if possible.  The risks of regulation or antiplatelet agents outweigh the potential benefits at this time.
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
## 2019-06-08 NOTE — Progress Notes (Signed)
Patient is presenting more awake and alert. Able to make needs known- can answer questions more directly. Not sure of his level of being a great health historian. MD's would like for his level of awareness to be bright so that we can obtain a secure baseline.
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
## 2019-06-08 NOTE — Progress Notes (Signed)
Orthopedic Tech Progress Note Patient Details:  Zachary Hoffman 1940/06/07 [id number]  Patient ID: Zachary Hoffman, male   DOB: 04-13-1940, 79 y.o.   MRN: [id number]   Zachary Hoffman 06/08/2019, 12:43 PMRouted brace order to [employer]
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
## 2019-06-08 NOTE — Progress Notes (Signed)
Inpatient Diabetes Program Recommendations  AACE/ADA: New Consensus Statement on Inpatient Glycemic Control (2015)  Target Ranges:  Prepandial:   less than 140 mg/dL      Peak postprandial:   less than 180 mg/dL (1-2 hours)      Critically ill patients:  140 - 180 mg/dL   Lab Results  Component Value Date   GLUCAP 249 (H) 06/08/2019   HGBA1C 8.6 (H) 06/15/2019    Review of Glycemic Control Results for DEJESUS, BRENA (MRN [id number]) as of 06/08/2019 11:08  Ref. Range 06/06/2019 21:32 06/08/2019 06:44  Glucose-Capillary Latest Ref Range: 70 - 99 mg/dL 352 (H) 249 (H)   Diabetes history: Type 2 DM Outpatient Diabetes medications: Apidra 0-10 SSI, Lantus 20 units BID Current orders for Inpatient glycemic control: Lantus 10 units QHS, Novolog 0-15 units TID, Novolog 0-5 units QHS  Inpatient Diabetes Program Recommendations:    Consider increasing Lantus to 10 units BID.   Thanks, Bronson Curb, MSN, RNC-OB Diabetes Coordinator [phone] (8a-5p)

## 2019-06-09 ENCOUNTER — Inpatient Hospital Stay (HOSPITAL_COMMUNITY): Payer: No Typology Code available for payment source

## 2019-06-09 LAB — GLUCOSE, CAPILLARY
Glucose-Capillary: 239 mg/dL — ABNORMAL HIGH (ref 70–99)
Glucose-Capillary: 158 mg/dL — ABNORMAL HIGH (ref 70–99)
Glucose-Capillary: 135 mg/dL — ABNORMAL HIGH (ref 70–99)
Glucose-Capillary: 114 mg/dL — ABNORMAL HIGH (ref 70–99)

## 2019-06-09 LAB — BASIC METABOLIC PANEL
Anion gap: 9 (ref 5–15)
BUN: 26 mg/dL — ABNORMAL HIGH (ref 8–23)
CO2: 18 mmol/L — ABNORMAL LOW (ref 22–32)
Calcium: 8.8 mg/dL — ABNORMAL LOW (ref 8.9–10.3)
Chloride: 111 mmol/L (ref 98–111)
Creatinine, Ser: 2.43 mg/dL — ABNORMAL HIGH (ref 0.61–1.24)
GFR calc Af Amer: 28 mL/min — ABNORMAL LOW (ref >60)
GFR calc non Af Amer: 25 mL/min — ABNORMAL LOW (ref >60)
Glucose, Bld: 232 mg/dL — ABNORMAL HIGH (ref 70–99)
Potassium: 4.9 mmol/L (ref 3.5–5.1)
Sodium: 138 mmol/L (ref 135–145)

## 2019-06-09 LAB — CBC
HCT: 30.1 % — ABNORMAL LOW (ref 39.0–52.0)
Hemoglobin: 9.4 g/dL — ABNORMAL LOW (ref 13.0–17.0)
MCH: 27.2 pg (ref 26.0–34.0)
MCHC: 31.2 g/dL (ref 30.0–36.0)
MCV: 87 fL (ref 80.0–100.0)
Platelets: 238 10*3/uL (ref 150–400)
RBC: 3.46 MIL/uL — ABNORMAL LOW (ref 4.22–5.81)
RDW: 16.1 % — ABNORMAL HIGH (ref 11.5–15.5)
WBC: 5.9 10*3/uL (ref 4.0–10.5)
nRBC: 0 % (ref 0.0–0.2)

## 2019-06-09 LAB — TROPONIN I (HIGH SENSITIVITY)
Troponin I (High Sensitivity): 72 ng/L — ABNORMAL HIGH (ref <18)
Troponin I (High Sensitivity): 76 ng/L — ABNORMAL HIGH (ref <18)

## 2019-06-09 LAB — MAGNESIUM: Magnesium: 1.6 mg/dL — ABNORMAL LOW (ref 1.7–2.4)

## 2019-06-09 MED ORDER — BISACODYL 10 MG RE SUPP
10.0000 mg | Freq: Once | RECTAL | Status: AC
  Administered 2019-06-09: 10 mg via RECTAL

## 2019-06-09 MED ORDER — POLYETHYLENE GLYCOL 3350 17 G PO PACK: 17.0000 g | PACK | Freq: Every day | ORAL | Status: DC

## 2019-06-09 MED ORDER — METOPROLOL TARTRATE 5 MG/5ML IV SOLN
INTRAVENOUS | Status: AC
  Filled 2019-06-09: qty 5

## 2019-06-09 MED ORDER — METOPROLOL TARTRATE 5 MG/5ML IV SOLN
5.0000 mg | Freq: Three times a day (TID) | INTRAVENOUS | Status: DC | PRN
  Administered 2019-06-09 – 2019-06-16 (×5): 5 mg via INTRAVENOUS
  Filled 2019-06-09 (×7): qty 5
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
## 2019-06-10 ENCOUNTER — Inpatient Hospital Stay (HOSPITAL_COMMUNITY): Payer: No Typology Code available for payment source

## 2019-06-10 ENCOUNTER — Encounter (HOSPITAL_COMMUNITY): Admission: EM | Disposition: E | Payer: Self-pay | Source: Home / Self Care

## 2019-06-10 ENCOUNTER — Encounter (HOSPITAL_COMMUNITY): Payer: Self-pay

## 2019-06-10 ENCOUNTER — Inpatient Hospital Stay (HOSPITAL_COMMUNITY): Payer: No Typology Code available for payment source | Admitting: Certified Registered Nurse Anesthetist

## 2019-06-10 DIAGNOSIS — Z981 Arthrodesis status: Secondary | ICD-10-CM

## 2019-06-10 HISTORY — PX: ANTERIOR CERVICAL DECOMP/DISCECTOMY FUSION: SHX1161

## 2019-06-10 LAB — GLUCOSE, CAPILLARY
Glucose-Capillary: 151 mg/dL — ABNORMAL HIGH (ref 70–99)
Glucose-Capillary: 146 mg/dL — ABNORMAL HIGH (ref 70–99)
Glucose-Capillary: 167 mg/dL — ABNORMAL HIGH (ref 70–99)
Glucose-Capillary: 187 mg/dL — ABNORMAL HIGH (ref 70–99)
Glucose-Capillary: 237 mg/dL — ABNORMAL HIGH (ref 70–99)

## 2019-06-10 LAB — CBC WITH DIFFERENTIAL/PLATELET
Abs Immature Granulocytes: 0.01 10*3/uL (ref 0.00–0.07)
Basophils Absolute: 0 10*3/uL (ref 0.0–0.1)
Basophils Relative: 0 %
Eosinophils Absolute: 0.1 10*3/uL (ref 0.0–0.5)
Eosinophils Relative: 1 %
HCT: 30.4 % — ABNORMAL LOW (ref 39.0–52.0)
Hemoglobin: 9.2 g/dL — ABNORMAL LOW (ref 13.0–17.0)
Immature Granulocytes: 0 %
Lymphocytes Relative: 14 %
Lymphs Abs: 0.8 10*3/uL (ref 0.7–4.0)
MCH: 26.9 pg (ref 26.0–34.0)
MCHC: 30.3 g/dL (ref 30.0–36.0)
MCV: 88.9 fL (ref 80.0–100.0)
Monocytes Absolute: 0.6 10*3/uL (ref 0.1–1.0)
Monocytes Relative: 11 %
Neutro Abs: 4 10*3/uL (ref 1.7–7.7)
Neutrophils Relative %: 74 %
Platelets: 252 10*3/uL (ref 150–400)
RBC: 3.42 MIL/uL — ABNORMAL LOW (ref 4.22–5.81)
RDW: 16 % — ABNORMAL HIGH (ref 11.5–15.5)
WBC: 5.4 10*3/uL (ref 4.0–10.5)
nRBC: 0 % (ref 0.0–0.2)

## 2019-06-10 LAB — BASIC METABOLIC PANEL
Anion gap: 13 (ref 5–15)
BUN: 29 mg/dL — ABNORMAL HIGH (ref 8–23)
CO2: 16 mmol/L — ABNORMAL LOW (ref 22–32)
Calcium: 9.2 mg/dL (ref 8.9–10.3)
Chloride: 111 mmol/L (ref 98–111)
Creatinine, Ser: 2.54 mg/dL — ABNORMAL HIGH (ref 0.61–1.24)
GFR calc Af Amer: 27 mL/min — ABNORMAL LOW (ref >60)
GFR calc non Af Amer: 23 mL/min — ABNORMAL LOW (ref >60)
Glucose, Bld: 161 mg/dL — ABNORMAL HIGH (ref 70–99)
Potassium: 4.8 mmol/L (ref 3.5–5.1)
Sodium: 140 mmol/L (ref 135–145)

## 2019-06-10 LAB — MAGNESIUM: Magnesium: 1.6 mg/dL — ABNORMAL LOW (ref 1.7–2.4)

## 2019-06-10 LAB — SURGICAL PCR SCREEN
MRSA, PCR: NEGATIVE
Staphylococcus aureus: NEGATIVE

## 2019-06-10 SURGERY — ANTERIOR CERVICAL DECOMPRESSION/DISCECTOMY FUSION 1 LEVEL
Anesthesia: General

## 2019-06-10 MED ORDER — SODIUM CHLORIDE 0.9% FLUSH: 3.0000 mL | Freq: Two times a day (BID) | INTRAVENOUS | Status: DC

## 2019-06-10 MED ORDER — 0.9 % SODIUM CHLORIDE (POUR BTL) OPTIME
TOPICAL | Status: DC | PRN
  Administered 2019-06-10: 1000 mL

## 2019-06-10 MED ORDER — ROCURONIUM BROMIDE 10 MG/ML (PF) SYRINGE
PREFILLED_SYRINGE | INTRAVENOUS | Status: AC
  Filled 2019-06-10: qty 10

## 2019-06-10 MED ORDER — MENTHOL 3 MG MT LOZG
1.0000 | LOZENGE | OROMUCOSAL | Status: DC | PRN
  Filled 2019-06-10: qty 9

## 2019-06-10 MED ORDER — ACETAMINOPHEN 650 MG RE SUPP
650.0000 mg | RECTAL | Status: DC | PRN
  Administered 2019-06-16: 650 mg via RECTAL
  Filled 2019-06-10: qty 1

## 2019-06-10 MED ORDER — THROMBIN 5000 UNITS EX SOLR
CUTANEOUS | Status: DC | PRN
  Administered 2019-06-10 (×2): 5000 [IU] via TOPICAL

## 2019-06-10 MED ORDER — PHENYLEPHRINE HCL-NACL 10-0.9 MG/250ML-% IV SOLN
INTRAVENOUS | Status: DC | PRN
  Administered 2019-06-10: 25 ug/min via INTRAVENOUS

## 2019-06-10 MED ORDER — LIDOCAINE 2% (20 MG/ML) 5 ML SYRINGE
INTRAMUSCULAR | Status: DC | PRN
  Administered 2019-06-10: 100 mg via INTRAVENOUS

## 2019-06-10 MED ORDER — FENTANYL CITRATE (PF) 250 MCG/5ML IJ SOLN
INTRAMUSCULAR | Status: AC
  Filled 2019-06-10: qty 5

## 2019-06-10 MED ORDER — PROPOFOL 10 MG/ML IV BOLUS
INTRAVENOUS | Status: DC | PRN
  Administered 2019-06-10: 100 mg via INTRAVENOUS

## 2019-06-10 MED ORDER — FENTANYL CITRATE (PF) 250 MCG/5ML IJ SOLN
INTRAMUSCULAR | Status: DC | PRN
  Administered 2019-06-10 (×2): 50 ug via INTRAVENOUS
  Administered 2019-06-10: 150 ug via INTRAVENOUS
  Administered 2019-06-10: 50 ug via INTRAVENOUS

## 2019-06-10 MED ORDER — ESMOLOL HCL 100 MG/10ML IV SOLN
INTRAVENOUS | Status: AC
  Filled 2019-06-10: qty 10

## 2019-06-10 MED ORDER — ESMOLOL HCL 100 MG/10ML IV SOLN
INTRAVENOUS | Status: DC | PRN
  Administered 2019-06-10 (×2): 10 mg via INTRAVENOUS

## 2019-06-10 MED ORDER — MORPHINE SULFATE (PF) 2 MG/ML IV SOLN: 2.0000 mg | INTRAVENOUS | Status: DC | PRN

## 2019-06-10 MED ORDER — SODIUM CHLORIDE 0.9% FLUSH: 3.0000 mL | INTRAVENOUS | Status: DC | PRN

## 2019-06-10 MED ORDER — BUPIVACAINE HCL (PF) 0.25 % IJ SOLN
INTRAMUSCULAR | Status: AC
  Filled 2019-06-10: qty 30

## 2019-06-10 MED ORDER — ONDANSETRON HCL 4 MG/2ML IJ SOLN
INTRAMUSCULAR | Status: DC | PRN
  Administered 2019-06-10: 4 mg via INTRAVENOUS

## 2019-06-10 MED ORDER — SODIUM CHLORIDE 0.9 % IV SOLN
INTRAVENOUS | Status: DC | PRN
  Administered 2019-06-10: 500 mL

## 2019-06-10 MED ORDER — THROMBIN 20000 UNITS EX SOLR
CUTANEOUS | Status: AC
  Filled 2019-06-10: qty 20000

## 2019-06-10 MED ORDER — CEFAZOLIN SODIUM-DEXTROSE 2-4 GM/100ML-% IV SOLN
2.0000 g | INTRAVENOUS | Status: AC
  Administered 2019-06-11 – 2019-06-12 (×2): 2 g via INTRAVENOUS
  Filled 2019-06-10 (×2): qty 100

## 2019-06-10 MED ORDER — THROMBIN 5000 UNITS EX SOLR
CUTANEOUS | Status: AC
  Filled 2019-06-10: qty 10000

## 2019-06-10 MED ORDER — FERROUS SULFATE 325 (65 FE) MG PO TABS: 325.0000 mg | ORAL_TABLET | Freq: Every day | ORAL | Status: DC

## 2019-06-10 MED ORDER — ROCURONIUM BROMIDE 10 MG/ML (PF) SYRINGE
PREFILLED_SYRINGE | INTRAVENOUS | Status: DC | PRN
  Administered 2019-06-10: 100 mg via INTRAVENOUS

## 2019-06-10 MED ORDER — POLYETHYLENE GLYCOL 3350 17 G PO PACK
17.0000 g | PACK | Freq: Two times a day (BID) | ORAL | Status: DC
  Administered 2019-06-11 (×2): 17 g via ORAL
  Filled 2019-06-10 (×2): qty 1

## 2019-06-10 MED ORDER — ONDANSETRON HCL 4 MG PO TABS: 4.0000 mg | ORAL_TABLET | Freq: Four times a day (QID) | ORAL | Status: DC | PRN

## 2019-06-10 MED ORDER — SENNA 8.6 MG PO TABS: 1.0000 | ORAL_TABLET | Freq: Two times a day (BID) | ORAL | Status: DC

## 2019-06-10 MED ORDER — LIDOCAINE 2% (20 MG/ML) 5 ML SYRINGE
INTRAMUSCULAR | Status: AC
  Filled 2019-06-10: qty 5

## 2019-06-10 MED ORDER — HEMOSTATIC AGENTS (NO CHARGE) OPTIME
TOPICAL | Status: DC | PRN
  Administered 2019-06-10: 1 via TOPICAL

## 2019-06-10 MED ORDER — PROPOFOL 10 MG/ML IV BOLUS
INTRAVENOUS | Status: AC
  Filled 2019-06-10: qty 20

## 2019-06-10 MED ORDER — SUGAMMADEX SODIUM 200 MG/2ML IV SOLN
INTRAVENOUS | Status: DC | PRN
  Administered 2019-06-10: 200 mg via INTRAVENOUS

## 2019-06-10 MED ORDER — PHENOL 1.4 % MT LIQD: 1.0000 | OROMUCOSAL | Status: DC | PRN

## 2019-06-10 MED ORDER — CEFAZOLIN SODIUM-DEXTROSE 2-4 GM/100ML-% IV SOLN
2.0000 g | Freq: Once | INTRAVENOUS | Status: AC
  Administered 2019-06-10: 2 g via INTRAVENOUS

## 2019-06-10 MED ORDER — BUPIVACAINE HCL (PF) 0.25 % IJ SOLN
INTRAMUSCULAR | Status: DC | PRN
  Administered 2019-06-10: 6 mL

## 2019-06-10 MED ORDER — SODIUM CHLORIDE 0.9 % IV SOLN: INTRAVENOUS | Status: DC

## 2019-06-10 MED ORDER — NALOXONE HCL 0.4 MG/ML IJ SOLN
INTRAMUSCULAR | Status: AC
  Filled 2019-06-10: qty 1

## 2019-06-10 MED ORDER — THROMBIN 5000 UNITS EX SOLR
CUTANEOUS | Status: AC
  Filled 2019-06-10: qty 5000

## 2019-06-10 MED ORDER — ACETAMINOPHEN 325 MG PO TABS: 650.0000 mg | ORAL_TABLET | ORAL | Status: DC | PRN

## 2019-06-10 MED ORDER — POTASSIUM CHLORIDE IN NACL 20-0.9 MEQ/L-% IV SOLN
INTRAVENOUS | Status: DC
  Filled 2019-06-10: qty 1000

## 2019-06-10 MED ORDER — ALBUMIN HUMAN 5 % IV SOLN: INTRAVENOUS | Status: DC | PRN

## 2019-06-10 MED ORDER — CEFAZOLIN SODIUM-DEXTROSE 2-4 GM/100ML-% IV SOLN
INTRAVENOUS | Status: AC
  Filled 2019-06-10: qty 100

## 2019-06-10 MED ORDER — ONDANSETRON HCL 4 MG/2ML IJ SOLN: 4.0000 mg | Freq: Four times a day (QID) | INTRAMUSCULAR | Status: DC | PRN

## 2019-06-10 MED ORDER — SODIUM CHLORIDE 0.9 % IV SOLN: 250.0000 mL | INTRAVENOUS | Status: DC

## 2019-06-10 MED ORDER — THROMBIN 5000 UNITS EX SOLR
OROMUCOSAL | Status: DC | PRN
  Administered 2019-06-10: 5 mL via TOPICAL

## 2019-06-10 SURGICAL SUPPLY — 52 items
BAG DECANTER FOR FLEXI CONT (MISCELLANEOUS) ×3 IMPLANT
BASKET BONE COLLECTION (BASKET) IMPLANT
BENZOIN TINCTURE PRP APPL 2/3 (GAUZE/BANDAGES/DRESSINGS) ×3 IMPLANT
BUR CARBIDE MATCH 3.0 (BURR) ×3 IMPLANT
CANISTER SUCT 3000ML PPV (MISCELLANEOUS) ×3 IMPLANT
CARTRIDGE OIL MAESTRO DRILL (MISCELLANEOUS) ×1 IMPLANT
CLOSURE WOUND 1/2 X4 (GAUZE/BANDAGES/DRESSINGS) ×1
COVER WAND RF STERILE (DRAPES) ×3 IMPLANT
DIFFUSER DRILL AIR PNEUMATIC (MISCELLANEOUS) ×3 IMPLANT
DRAPE C-ARM 42X72 X-RAY (DRAPES) ×12 IMPLANT
DRAPE LAPAROTOMY 100X72 PEDS (DRAPES) ×3 IMPLANT
DRAPE MICROSCOPE LEICA (MISCELLANEOUS) ×3 IMPLANT
DRSG OPSITE POSTOP 4X6 (GAUZE/BANDAGES/DRESSINGS) ×3 IMPLANT
DURAPREP 6ML APPLICATOR 50/CS (WOUND CARE) ×3 IMPLANT
ELECT BLADE 4.0 EZ CLEAN MEGAD (MISCELLANEOUS) ×3
ELECT COATED BLADE 2.86 ST (ELECTRODE) ×3 IMPLANT
ELECT REM PT RETURN 9FT ADLT (ELECTROSURGICAL) ×3
ELECTRODE BLDE 4.0 EZ CLN MEGD (MISCELLANEOUS) ×1 IMPLANT
ELECTRODE REM PT RTRN 9FT ADLT (ELECTROSURGICAL) ×1 IMPLANT
GAUZE 4X4 16PLY RFD (DISPOSABLE) IMPLANT
GLOVE BIO SURGEON STRL SZ7 (GLOVE) IMPLANT
GLOVE BIO SURGEON STRL SZ8 (GLOVE) ×3 IMPLANT
GLOVE BIOGEL PI IND STRL 7.0 (GLOVE) IMPLANT
GLOVE BIOGEL PI INDICATOR 7.0 (GLOVE)
GOWN STRL REUS W/ TWL LRG LVL3 (GOWN DISPOSABLE) IMPLANT
GOWN STRL REUS W/ TWL XL LVL3 (GOWN DISPOSABLE) IMPLANT
GOWN STRL REUS W/TWL 2XL LVL3 (GOWN DISPOSABLE) ×3 IMPLANT
GOWN STRL REUS W/TWL LRG LVL3 (GOWN DISPOSABLE)
GOWN STRL REUS W/TWL XL LVL3 (GOWN DISPOSABLE)
HEMOSTAT POWDER KIT SURGIFOAM (HEMOSTASIS) ×3 IMPLANT
KIT BASIN OR (CUSTOM PROCEDURE TRAY) ×3 IMPLANT
KIT TURNOVER KIT B (KITS) ×3 IMPLANT
NEEDLE HYPO 25X1 1.5 SAFETY (NEEDLE) ×3 IMPLANT
NEEDLE SPNL 20GX3.5 QUINCKE YW (NEEDLE) ×3 IMPLANT
NS IRRIG 1000ML POUR BTL (IV SOLUTION) ×3 IMPLANT
OIL CARTRIDGE MAESTRO DRILL (MISCELLANEOUS) ×3
PACK LAMINECTOMY NEURO (CUSTOM PROCEDURE TRAY) ×3 IMPLANT
PAD ARMBOARD 7.5X6 YLW CONV (MISCELLANEOUS) ×3 IMPLANT
PIN DISTRACTION 14MM (PIN) ×6 IMPLANT
PLATE 14MM (Plate) ×3 IMPLANT
RUBBERBAND STERILE (MISCELLANEOUS) ×6 IMPLANT
SCREW HEX FA SD 4X14 (Screw) ×3 IMPLANT
SCREW HEX FA SD 4X16 (Screw) ×9 IMPLANT
SPACER ASSEM CERV LORD 8M (Spacer) ×3 IMPLANT
SPONGE INTESTINAL PEANUT (DISPOSABLE) ×3 IMPLANT
SPONGE SURGIFOAM ABS GEL SZ50 (HEMOSTASIS) ×3 IMPLANT
STRIP CLOSURE SKIN 1/2X4 (GAUZE/BANDAGES/DRESSINGS) ×2 IMPLANT
SUT VIC AB 3-0 SH 8-18 (SUTURE) ×3 IMPLANT
SUT VICRYL 4-0 PS2 18IN ABS (SUTURE) IMPLANT
TOWEL GREEN STERILE (TOWEL DISPOSABLE) ×3 IMPLANT
TOWEL GREEN STERILE FF (TOWEL DISPOSABLE) ×3 IMPLANT
WATER STERILE IRR 1000ML POUR (IV SOLUTION) ×3 IMPLANT
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
## 2019-06-10 NOTE — Transfer of Care (Signed)
Immediate Anesthesia Transfer of Care Note  Patient: XACHARY HAMBLY  Procedure(s) Performed: C4-5 ANTERIOR CERVICAL DECOMPRESSION/DISCECTOMY FUSION (N/A )  Patient Location: PACU  Anesthesia Type:General  Level of Consciousness: awake and alert   Airway & Oxygen Therapy: Patient Spontanous Breathing and Patient connected to face mask oxygen  Post-op Assessment: Report given to RN and Post -op Vital signs reviewed and stable  Post vital signs: Reviewed and stable  Last Vitals:  Vitals Value Taken Time  BP 138/67   Temp    Pulse 104   Resp 14   SpO2 98     Last Pain:  Vitals:   07/05/2019 1328  TempSrc:   PainSc: 0-No pain      Patients Stated Pain Goal: 0 (11/94/17 4081)  Complications: No apparent anesthesia complications

## 2019-06-10 NOTE — Progress Notes (Signed)
Called by RN that patient with dysphagia and difficult to arouse after returning to floor from OR. Rapid RN administered Narcan and patient was awake and following commands and prev symptoms resolved.  Stat CT H ordered.
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
## 2019-06-10 NOTE — Significant Event (Addendum)
Rapid Response Event Note  Overview: Neurologic - Stroke ?  Initial Focused Assessment: Called by nursing staff with concerns of patient having aphasia post procedure, perhaps facial droop, and worsening weakness. Patient is s/p C4-C5 ACDF and since the patient arrived on the floor from PACU, patient has not been talking and this is not as his baseline. Pupils per nurse were pinpoint and sluggish. I asked the staff to check a blood sugar, which they did, to lay the patient's head down flat and that I was on my way. Upon arrival, patient was having trouble talking, able to move LUE and RLE to command (per nurse, this was consist with earlier exams) but not really following commands on RUE and LLE. I asked the nurse to give the patient Narcan 0.4mg  x 1, even though he was not given any pain medications in PACU, I saw that he did receive Fentanyl during induction in the OR. VS are overall stable, mild tachycardia. After the Narcan was given, patient was more awake, he was now talking - agitated, mild dysarthria perhaps at times but overall we could completely understand him. Patient wanted to leave, he was agitated, trying to the [employer] off - Pupils are 3 mm brisk and reactive. Patient endorsed that he felt like he was "getting any air in" - lung sounds clear throughout, good air movement, 100% on RA, and his neck does not appear swollen, I palpated as much as I could around his neck and I did not feel a hematoma - dressing CDI. I did not appreciate any facial weakness, after the Narcan, patient was able to follow commands in all extremities, bilateral grips were almost equal, RT slightly weak but per nurse, this was ongoing, no sensory loss and no neglect that I could appreciate.   Interventions: -- Narcan 0.4 mg IV x1 -- STAT CT Head  Plan of Care: -- Patient to go for STAT CT HEAD  -- Monitor neurologic exam  -- Rest per medical team  Event Summary:  Call Time 1730 Arrival Time 3007 (I was  with another patient in an emergency) End Time 1810   Bryar Rennie R
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
## 2019-06-10 NOTE — Anesthesia Preprocedure Evaluation (Signed)
Anesthesia Evaluation  Patient identified by MRN, date of birth, ID band Patient awake    Reviewed: Allergy & Precautions, NPO status , Patient's Chart, lab work & pertinent test results  Airway Mallampati: I  TM Distance: >3 FB Neck ROM: Full    Dental   Pulmonary    Pulmonary exam normal        Cardiovascular hypertension, Pt. on medications Normal cardiovascular exam     Neuro/Psych    GI/Hepatic   Endo/Other  diabetes, Type 2  Renal/GU Renal InsufficiencyRenal disease     Musculoskeletal   Abdominal   Peds  Hematology   Anesthesia Other Findings   Reproductive/Obstetrics                             Anesthesia Physical Anesthesia Plan  ASA: III  Anesthesia Plan: General   Post-op Pain Management:    Induction: Intravenous  PONV Risk Score and Plan: 2 and Ondansetron and Treatment may vary due to age or medical condition  Airway Management Planned: Video Laryngoscope Planned and Oral ETT  Additional Equipment:   Intra-op Plan:   Post-operative Plan: Extubation in OR  Informed Consent: I have reviewed the patients History and Physical, chart, labs and discussed the procedure including the risks, benefits and alternatives for the proposed anesthesia with the patient or authorized representative who has indicated his/her understanding and acceptance.       Plan Discussed with: CRNA and Surgeon  Anesthesia Plan Comments:         Anesthesia Quick Evaluation

## 2019-06-10 NOTE — Anesthesia Procedure Notes (Signed)
Procedure Name: Intubation Date/Time: 06/17/2019 1:59 PM Performed by: Alain Marion, CRNA Pre-anesthesia Checklist: Patient identified, Emergency Drugs available, Suction available and Patient being monitored Patient Re-evaluated:Patient Re-evaluated prior to induction Oxygen Delivery Method: Circle System Utilized Preoxygenation: Pre-oxygenation with 100% oxygen Induction Type: IV induction Ventilation: Oral airway inserted - appropriate to patient size, Mask ventilation with difficulty and Mask ventilation without difficulty Laryngoscope Size: Glidescope and 4 Grade View: Grade I Tube type: Oral Tube size: 7.0 mm Number of attempts: 1 Airway Equipment and Method: Stylet,  Oral airway and Video-laryngoscopy Placement Confirmation: ETT inserted through vocal cords under direct vision,  positive ETCO2 and breath sounds checked- equal and bilateral Secured at: 21 cm Tube secured with: Tape Dental Injury: Teeth and Oropharynx as per pre-operative assessment  Comments: C spine stabilized during glidescope intubation with no complications noted

## 2019-06-10 NOTE — Anesthesia Postprocedure Evaluation (Signed)
Anesthesia Post Note  Patient: Zachary Hoffman  Procedure(s) Performed: C4-5 ANTERIOR CERVICAL DECOMPRESSION/DISCECTOMY FUSION (N/A )     Patient location during evaluation: PACU Anesthesia Type: General Level of consciousness: awake and alert Pain management: pain level controlled Vital Signs Assessment: post-procedure vital signs reviewed and stable Respiratory status: spontaneous breathing, nonlabored ventilation, respiratory function stable and patient connected to nasal cannula oxygen Cardiovascular status: blood pressure returned to baseline and stable Postop Assessment: no apparent nausea or vomiting Anesthetic complications: no    Last Vitals:  Vitals:   06/30/2019 1630 06/15/2019 1645  BP: 132/62 (!) 144/62  Pulse: (!) 109 (!) 110  Resp: 19 19  Temp:    SpO2: 98% 95%    Last Pain:  Vitals:   06/16/2019 1645  TempSrc:   PainSc: Asleep    LLE Motor Response: Purposeful movement;Responds to commands (06/30/2019 1645) LLE Sensation: Full sensation (06/17/2019 1645) RLE Motor Response: Purposeful movement;Responds to commands (06/29/2019 1645) RLE Sensation: Full sensation (07/05/2019 1645)      Kolbi Altadonna DAVID

## 2019-06-11 ENCOUNTER — Inpatient Hospital Stay (HOSPITAL_COMMUNITY): Payer: No Typology Code available for payment source | Admitting: Registered Nurse

## 2019-06-11 ENCOUNTER — Inpatient Hospital Stay (HOSPITAL_COMMUNITY): Payer: No Typology Code available for payment source

## 2019-06-11 LAB — CBC WITH DIFFERENTIAL/PLATELET
Abs Immature Granulocytes: 0.16 10*3/uL — ABNORMAL HIGH (ref 0.00–0.07)
Basophils Absolute: 0 10*3/uL (ref 0.0–0.1)
Basophils Relative: 0 %
Eosinophils Absolute: 0.2 10*3/uL (ref 0.0–0.5)
Eosinophils Relative: 3 %
HCT: 29.8 % — ABNORMAL LOW (ref 39.0–52.0)
Hemoglobin: 9.1 g/dL — ABNORMAL LOW (ref 13.0–17.0)
Immature Granulocytes: 2 %
Lymphocytes Relative: 26 %
Lymphs Abs: 2.1 10*3/uL (ref 0.7–4.0)
MCH: 28.1 pg (ref 26.0–34.0)
MCHC: 30.5 g/dL (ref 30.0–36.0)
MCV: 92 fL (ref 80.0–100.0)
Monocytes Absolute: 0.5 10*3/uL (ref 0.1–1.0)
Monocytes Relative: 7 %
Neutro Abs: 4.9 10*3/uL (ref 1.7–7.7)
Neutrophils Relative %: 62 %
Platelets: 96 10*3/uL — ABNORMAL LOW (ref 150–400)
RBC: 3.24 MIL/uL — ABNORMAL LOW (ref 4.22–5.81)
RDW: 16.3 % — ABNORMAL HIGH (ref 11.5–15.5)
WBC: 7.9 10*3/uL (ref 4.0–10.5)
nRBC: 0.8 % — ABNORMAL HIGH (ref 0.0–0.2)

## 2019-06-11 LAB — POCT I-STAT 7, (LYTES, BLD GAS, ICA,H+H)
Acid-base deficit: 13 mmol/L — ABNORMAL HIGH (ref 0.0–2.0)
Bicarbonate: 14.9 mmol/L — ABNORMAL LOW (ref 20.0–28.0)
Calcium, Ion: 1.37 mmol/L (ref 1.15–1.40)
HCT: 26 % — ABNORMAL LOW (ref 39.0–52.0)
Hemoglobin: 8.8 g/dL — ABNORMAL LOW (ref 13.0–17.0)
O2 Saturation: 100 %
Patient temperature: 98
Potassium: 5 mmol/L (ref 3.5–5.1)
Sodium: 144 mmol/L (ref 135–145)
TCO2: 16 mmol/L — ABNORMAL LOW (ref 22–32)
pCO2 arterial: 40.2 mmHg (ref 32.0–48.0)
pH, Arterial: 7.174 — CL (ref 7.350–7.450)
pO2, Arterial: 535 mmHg — ABNORMAL HIGH (ref 83.0–108.0)

## 2019-06-11 LAB — BASIC METABOLIC PANEL
Anion gap: 18 — ABNORMAL HIGH (ref 5–15)
Anion gap: 13 (ref 5–15)
Anion gap: 9 (ref 5–15)
BUN: 41 mg/dL — ABNORMAL HIGH (ref 8–23)
BUN: 51 mg/dL — ABNORMAL HIGH (ref 8–23)
BUN: 49 mg/dL — ABNORMAL HIGH (ref 8–23)
CO2: 12 mmol/L — ABNORMAL LOW (ref 22–32)
CO2: 16 mmol/L — ABNORMAL LOW (ref 22–32)
CO2: 16 mmol/L — ABNORMAL LOW (ref 22–32)
Calcium: 9.4 mg/dL (ref 8.9–10.3)
Calcium: 8.7 mg/dL — ABNORMAL LOW (ref 8.9–10.3)
Calcium: 8.4 mg/dL — ABNORMAL LOW (ref 8.9–10.3)
Chloride: 112 mmol/L — ABNORMAL HIGH (ref 98–111)
Chloride: 114 mmol/L — ABNORMAL HIGH (ref 98–111)
Chloride: 117 mmol/L — ABNORMAL HIGH (ref 98–111)
Creatinine, Ser: 3.25 mg/dL — ABNORMAL HIGH (ref 0.61–1.24)
Creatinine, Ser: 3.5 mg/dL — ABNORMAL HIGH (ref 0.61–1.24)
Creatinine, Ser: 3.41 mg/dL — ABNORMAL HIGH (ref 0.61–1.24)
GFR calc Af Amer: 20 mL/min — ABNORMAL LOW (ref >60)
GFR calc Af Amer: 18 mL/min — ABNORMAL LOW (ref >60)
GFR calc Af Amer: 19 mL/min — ABNORMAL LOW (ref >60)
GFR calc non Af Amer: 17 mL/min — ABNORMAL LOW (ref >60)
GFR calc non Af Amer: 16 mL/min — ABNORMAL LOW (ref >60)
GFR calc non Af Amer: 16 mL/min — ABNORMAL LOW (ref >60)
Glucose, Bld: 341 mg/dL — ABNORMAL HIGH (ref 70–99)
Glucose, Bld: 371 mg/dL — ABNORMAL HIGH (ref 70–99)
Glucose, Bld: 306 mg/dL — ABNORMAL HIGH (ref 70–99)
Potassium: 5.1 mmol/L (ref 3.5–5.1)
Potassium: 4.7 mmol/L (ref 3.5–5.1)
Potassium: 4.5 mmol/L (ref 3.5–5.1)
Sodium: 142 mmol/L (ref 135–145)
Sodium: 143 mmol/L (ref 135–145)
Sodium: 142 mmol/L (ref 135–145)

## 2019-06-11 LAB — GLUCOSE, CAPILLARY
Glucose-Capillary: 104 mg/dL — ABNORMAL HIGH (ref 70–99)
Glucose-Capillary: 110 mg/dL — ABNORMAL HIGH (ref 70–99)
Glucose-Capillary: 114 mg/dL — ABNORMAL HIGH (ref 70–99)
Glucose-Capillary: 130 mg/dL — ABNORMAL HIGH (ref 70–99)
Glucose-Capillary: 138 mg/dL — ABNORMAL HIGH (ref 70–99)
Glucose-Capillary: 162 mg/dL — ABNORMAL HIGH (ref 70–99)
Glucose-Capillary: 186 mg/dL — ABNORMAL HIGH (ref 70–99)
Glucose-Capillary: 218 mg/dL — ABNORMAL HIGH (ref 70–99)
Glucose-Capillary: 276 mg/dL — ABNORMAL HIGH (ref 70–99)
Glucose-Capillary: 277 mg/dL — ABNORMAL HIGH (ref 70–99)
Glucose-Capillary: 338 mg/dL — ABNORMAL HIGH (ref 70–99)
Glucose-Capillary: 394 mg/dL — ABNORMAL HIGH (ref 70–99)
Glucose-Capillary: 394 mg/dL — ABNORMAL HIGH (ref 70–99)

## 2019-06-11 LAB — CBC
HCT: 27.9 % — ABNORMAL LOW (ref 39.0–52.0)
Hemoglobin: 8.3 g/dL — ABNORMAL LOW (ref 13.0–17.0)
MCH: 27.5 pg (ref 26.0–34.0)
MCHC: 29.7 g/dL — ABNORMAL LOW (ref 30.0–36.0)
MCV: 92.4 fL (ref 80.0–100.0)
Platelets: 239 10*3/uL (ref 150–400)
RBC: 3.02 MIL/uL — ABNORMAL LOW (ref 4.22–5.81)
RDW: 16 % — ABNORMAL HIGH (ref 11.5–15.5)
WBC: 4.1 10*3/uL (ref 4.0–10.5)
nRBC: 0 % (ref 0.0–0.2)

## 2019-06-11 LAB — PROTIME-INR
INR: 1.5 — ABNORMAL HIGH (ref 0.8–1.2)
Prothrombin Time: 17.7 seconds — ABNORMAL HIGH (ref 11.4–15.2)

## 2019-06-11 LAB — BETA-HYDROXYBUTYRIC ACID: Beta-Hydroxybutyric Acid: 1.97 mmol/L — ABNORMAL HIGH (ref 0.05–0.27)

## 2019-06-11 LAB — PHOSPHORUS: Phosphorus: 8.3 mg/dL — ABNORMAL HIGH (ref 2.5–4.6)

## 2019-06-11 LAB — TRIGLYCERIDES: Triglycerides: 82 mg/dL (ref <150)

## 2019-06-11 LAB — MAGNESIUM: Magnesium: 2.5 mg/dL — ABNORMAL HIGH (ref 1.7–2.4)

## 2019-06-11 MED ORDER — CHLORHEXIDINE GLUCONATE 0.12% ORAL RINSE (MEDLINE KIT)
15.0000 mL | Freq: Two times a day (BID) | OROMUCOSAL | Status: DC
  Administered 2019-06-11 – 2019-06-18 (×15): 15 mL via OROMUCOSAL

## 2019-06-11 MED ORDER — MIDAZOLAM HCL 2 MG/2ML IJ SOLN
INTRAMUSCULAR | Status: AC
  Filled 2019-06-11: qty 2

## 2019-06-11 MED ORDER — DOXAZOSIN MESYLATE 8 MG PO TABS
8.0000 mg | ORAL_TABLET | Freq: Every day | ORAL | Status: DC
  Administered 2019-06-12 – 2019-06-16 (×5): 8 mg
  Filled 2019-06-11 (×6): qty 1

## 2019-06-11 MED ORDER — SODIUM CHLORIDE 0.9 % IV SOLN: INTRAVENOUS | Status: DC

## 2019-06-11 MED ORDER — CHLORHEXIDINE GLUCONATE CLOTH 2 % EX PADS
6.0000 | MEDICATED_PAD | Freq: Every day | CUTANEOUS | Status: DC
  Administered 2019-06-12 – 2019-06-17 (×5): 6 via TOPICAL

## 2019-06-11 MED ORDER — ORAL CARE MOUTH RINSE
15.0000 mL | OROMUCOSAL | Status: DC
  Administered 2019-06-11 – 2019-06-18 (×74): 15 mL via OROMUCOSAL

## 2019-06-11 MED ORDER — DEXTROSE-NACL 5-0.45 % IV SOLN
INTRAVENOUS | Status: DC
  Administered 2019-06-11: 950 mL via INTRAVENOUS

## 2019-06-11 MED ORDER — VITAL HIGH PROTEIN PO LIQD: 1000.0000 mL | ORAL | Status: DC

## 2019-06-11 MED ORDER — SODIUM CHLORIDE 0.9 % IV BOLUS
1000.0000 mL | Freq: Once | INTRAVENOUS | Status: AC
  Administered 2019-06-11: 1000 mL via INTRAVENOUS

## 2019-06-11 MED ORDER — SENNA 8.6 MG PO TABS
1.0000 | ORAL_TABLET | Freq: Two times a day (BID) | ORAL | Status: DC
  Administered 2019-06-11 – 2019-06-15 (×8): 8.6 mg
  Filled 2019-06-11 (×11): qty 1

## 2019-06-11 MED ORDER — POLYETHYLENE GLYCOL 3350 17 G PO PACK
17.0000 g | PACK | Freq: Two times a day (BID) | ORAL | Status: DC
  Administered 2019-06-12 – 2019-06-15 (×7): 17 g
  Filled 2019-06-11 (×8): qty 1

## 2019-06-11 MED ORDER — INSULIN REGULAR(HUMAN) IN NACL 100-0.9 UT/100ML-% IV SOLN
INTRAVENOUS | Status: DC
  Administered 2019-06-11: 8.5 [IU]/h via INTRAVENOUS
  Filled 2019-06-11 (×2): qty 100

## 2019-06-11 MED ORDER — POTASSIUM CHLORIDE 10 MEQ/100ML IV SOLN
10.0000 meq | INTRAVENOUS | Status: AC
  Administered 2019-06-11 (×2): 10 meq via INTRAVENOUS
  Filled 2019-06-11 (×2): qty 100

## 2019-06-11 MED ORDER — VITAL AF 1.2 CAL PO LIQD
1000.0000 mL | ORAL | Status: DC
  Administered 2019-06-11 – 2019-06-16 (×4): 1000 mL

## 2019-06-11 MED ORDER — DEXTROSE 50 % IV SOLN: 0.0000 mL | INTRAVENOUS | Status: DC | PRN

## 2019-06-11 MED ORDER — SODIUM CHLORIDE 0.9 % IV SOLN
INTRAVENOUS | Status: DC
  Administered 2019-06-11: 980 mL via INTRAVENOUS

## 2019-06-11 MED ORDER — PRO-STAT SUGAR FREE PO LIQD
30.0000 mL | Freq: Two times a day (BID) | ORAL | Status: DC
  Administered 2019-06-11 – 2019-06-18 (×15): 30 mL
  Filled 2019-06-11 (×15): qty 30

## 2019-06-11 MED ORDER — PROPOFOL 1000 MG/100ML IV EMUL
5.0000 ug/kg/min | INTRAVENOUS | Status: DC
  Administered 2019-06-11 (×2): 20 ug/kg/min via INTRAVENOUS
  Administered 2019-06-12: 25 ug/kg/min via INTRAVENOUS
  Administered 2019-06-12: 30 ug/kg/min via INTRAVENOUS
  Administered 2019-06-12: 20 ug/kg/min via INTRAVENOUS
  Administered 2019-06-13: 25 ug/kg/min via INTRAVENOUS
  Administered 2019-06-14 (×2): 20 ug/kg/min via INTRAVENOUS
  Administered 2019-06-15: 15 ug/kg/min via INTRAVENOUS
  Filled 2019-06-11 (×9): qty 100

## 2019-06-11 MED ORDER — FENTANYL CITRATE (PF) 100 MCG/2ML IJ SOLN
25.0000 ug | INTRAMUSCULAR | Status: DC | PRN
  Administered 2019-06-11 – 2019-06-16 (×6): 50 ug via INTRAVENOUS
  Filled 2019-06-11 (×6): qty 2

## 2019-06-11 MED ORDER — SODIUM CHLORIDE 0.9 % IV SOLN: 250.0000 mL | INTRAVENOUS | Status: DC

## 2019-06-11 MED ORDER — SODIUM CHLORIDE 0.9% FLUSH: 10.0000 mL | INTRAVENOUS | Status: DC | PRN

## 2019-06-11 MED ORDER — SODIUM CHLORIDE 0.9% FLUSH
10.0000 mL | Freq: Two times a day (BID) | INTRAVENOUS | Status: DC
  Administered 2019-06-12: 10 mL
  Administered 2019-06-13 – 2019-06-14 (×2): 20 mL
  Administered 2019-06-14 – 2019-06-15 (×3): 10 mL
  Administered 2019-06-16: 20 mL
  Administered 2019-06-16 – 2019-06-17 (×3): 10 mL
  Administered 2019-06-18: 40 mL
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
## 2019-06-11 NOTE — Progress Notes (Signed)
OT Cancellation Note  Patient Details Name: BERTRAN ZEIMET MRN: [id number] DOB: 11/28/1940   Cancelled Treatment:    Reason Eval/Treat Not Completed: Medical issues which prohibited therapy.  Events of this am noted. Will check back early in the week.  Nilsa Nutting., OTR/L Acute Rehabilitation Services Pager [phone] Office [phone]   Lucille Passy M 06/11/2019, 6:18 AM
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
## 2019-06-11 NOTE — Procedures (Signed)
Pt had 12FR triple lumen catheter placed for emergent IV access. This was placed via Seldinger technique. The catheter was placed after dark venous, nonpulsatile blood was obtained. All ports aspirated easily. Pt tol the procedure well CXR pending

## 2019-06-11 NOTE — Progress Notes (Signed)
Patient ID: Zachary Hoffman, male   DOB: July 01, 1940, 79 y.o.   MRN: [id number] I spoke with his wife at the bedside and she called Zachary Hoffman's daughter on speaker phone so I could update them both. I answered their questions. The CT Dr. Ronnald Hoffman ordered has not been read yet.  Patient examined and I agree with the assessment and plan  Zachary Skeans, MD, MPH, FACS Please use [email or web address] to contact on call provider 06/11/2019 1:12 PM

## 2019-06-11 NOTE — Progress Notes (Signed)
Patient ID: Zachary Hoffman, male   DOB: [birth year], 79 y.o.   MRN: [id number] Events of last night noted.  It sounds like he was in ACLS for 20 to 21 minutes.  He is now intubated in the ICU.  His eyes are open and he blinks but he does not regard the examiner and he follows no commands.  He has a triple reflex in his legs but really cannot get him to move his arms to pain.  Every once in a while I think I see a flicker of movement.  He has tone in all 4 extremities.  His incision is clean and dry and flat without evidence of significant swelling.  He remains in his collar.  Unclear etiology of code.  Intubation team did not feel it looks like he had airway compression.  He seemed to be ventilating on a mask prior to intubation which would not be consistent with external compression from hematoma.  Will check CT scan of head and neck today.  I have examined the patient with Dr. Marcello Moores who is on-call for our team and he will follow-up on the imaging.

## 2019-06-11 NOTE — Progress Notes (Signed)
CXR confirmed correct placement of CVL with no ptx on my prelim read. Awaiting RADS MD read.

## 2019-06-11 NOTE — Progress Notes (Signed)
PT Cancellation Note  Patient Details Name: Zachary Hoffman MRN: [id number] DOB: 12/18/1940   Cancelled Treatment:    Reason Eval/Treat Not Completed: Medical issues which prohibited therapy  Events noted. Evaluation deferred at this time. Will follow and proceed when medically appropriate.    Arby Barrette, PT Pager [phone]   Rexanne Mano 06/11/2019, 9:53 AM
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
## 2019-06-11 NOTE — Anesthesia Procedure Notes (Signed)
Procedure Name: Intubation Date/Time: 06/11/2019 1:21 AM Performed by: Jearld Pies, CRNA Pre-anesthesia Checklist: Patient identified, Emergency Drugs available, Suction available and Patient being monitored Patient Re-evaluated:Patient Re-evaluated prior to induction Oxygen Delivery Method: Ambu bag Preoxygenation: Pre-oxygenation with 100% oxygen Ventilation: Mask ventilation without difficulty Laryngoscope Size: Glidescope and 4 Grade View: Grade I Tube type: Oral Tube size: 8.0 mm Number of attempts: 1 Airway Equipment and Method: Stylet and Video-laryngoscopy Placement Confirmation: ETT inserted through vocal cords under direct vision,  breath sounds checked- equal and bilateral and CO2 detector Secured at: 22 cm Tube secured with: Tape Dental Injury: Teeth and Oropharynx as per pre-operative assessment  Comments: Glidescope utilized d/t C-collar and cervical precautions. C-collar maintained during intubation. RT present to secure ETT with securement device.

## 2019-06-11 NOTE — Code Documentation (Signed)
  Patient Name: Zachary Hoffman   MRN: [id number]   Date of Birth/ Sex: [birth year] , male      Admission Date: 06/12/2019  Attending Provider: Md, Trauma, MD  Primary Diagnosis: <principal problem not specified>   Indication: Pt was in his usual state of health until this AM, when he was noted to be PEA. Code blue was subsequently called. At the time of arrival on scene, ACLS protocol was underway.   Technical Description:  - CPR performance duration:  21 minutes  - Was defibrillation or cardioversion used? No   - Was external pacer placed? No  - Was patient intubated pre/post CPR? No   Medications Administered: Y = Yes; Blank = No Amiodarone    Atropine    Calcium  Y  Epinephrine  Y  Lidocaine    Magnesium  Y  Norepinephrine    Phenylephrine    Sodium bicarbonate  Y  Vasopressin     Post CPR evaluation:  - Final Status - Was patient successfully resuscitated ? Yes - What is current rhythm? Sinus tach - What is current hemodynamic status? Stable  Miscellaneous Information:  - Labs sent, including: CMP, CBC, Lactic acid, Coags  - Primary team notified?  Yes  - Family Notified? No  - Additional notes/ transfer status: N/A     Ina Homes, MD  06/11/2019, 2:00 AM

## 2019-06-11 NOTE — Progress Notes (Signed)
Called by RN that patient had been coding for 19min after c/o trouble breathing. According to code team pt was given several rounds of ACLS and had ROSC. Pt lost ROSC and with further ACLS ROSC returned.  Pt was intubated and transferred to ICU.  I/O cath placed.  Labs ordered and drawn and CXR pending.

## 2019-06-11 NOTE — Progress Notes (Signed)
Patient was awake, agitated, and holleringl with slurred speech until overheard by assigned CNA "I can't breathe" who rushed into room and noted O2 saturation 88% on room air, HR in 50's - 70's on the monitor. Became unresponsive while applying oxygen via nasal cannula. CPR started at 0103 and code called. Paged Trauma MD on call, Dr Rosendo Gros and notified of the Event. Called Wife [name] and asked to come to the Hospital due to deterioration in patient condition. Elita Boone, BSN, RN

## 2019-06-12 ENCOUNTER — Inpatient Hospital Stay (HOSPITAL_COMMUNITY): Payer: No Typology Code available for payment source

## 2019-06-12 LAB — GLUCOSE, CAPILLARY
Glucose-Capillary: 170 mg/dL — ABNORMAL HIGH (ref 70–99)
Glucose-Capillary: 173 mg/dL — ABNORMAL HIGH (ref 70–99)
Glucose-Capillary: 182 mg/dL — ABNORMAL HIGH (ref 70–99)
Glucose-Capillary: 191 mg/dL — ABNORMAL HIGH (ref 70–99)
Glucose-Capillary: 193 mg/dL — ABNORMAL HIGH (ref 70–99)
Glucose-Capillary: 193 mg/dL — ABNORMAL HIGH (ref 70–99)
Glucose-Capillary: 204 mg/dL — ABNORMAL HIGH (ref 70–99)
Glucose-Capillary: 209 mg/dL — ABNORMAL HIGH (ref 70–99)
Glucose-Capillary: 211 mg/dL — ABNORMAL HIGH (ref 70–99)
Glucose-Capillary: 214 mg/dL — ABNORMAL HIGH (ref 70–99)
Glucose-Capillary: 216 mg/dL — ABNORMAL HIGH (ref 70–99)
Glucose-Capillary: 231 mg/dL — ABNORMAL HIGH (ref 70–99)
Glucose-Capillary: 247 mg/dL — ABNORMAL HIGH (ref 70–99)
Glucose-Capillary: 253 mg/dL — ABNORMAL HIGH (ref 70–99)
Glucose-Capillary: 269 mg/dL — ABNORMAL HIGH (ref 70–99)
Glucose-Capillary: 271 mg/dL — ABNORMAL HIGH (ref 70–99)
Glucose-Capillary: 274 mg/dL — ABNORMAL HIGH (ref 70–99)
Glucose-Capillary: 279 mg/dL — ABNORMAL HIGH (ref 70–99)

## 2019-06-12 LAB — CBC WITH DIFFERENTIAL/PLATELET
Abs Immature Granulocytes: 0.06 10*3/uL (ref 0.00–0.07)
Basophils Absolute: 0 10*3/uL (ref 0.0–0.1)
Basophils Relative: 0 %
Eosinophils Absolute: 0 10*3/uL (ref 0.0–0.5)
Eosinophils Relative: 0 %
HCT: 22.6 % — ABNORMAL LOW (ref 39.0–52.0)
Hemoglobin: 7.1 g/dL — ABNORMAL LOW (ref 13.0–17.0)
Immature Granulocytes: 1 %
Lymphocytes Relative: 8 %
Lymphs Abs: 0.5 10*3/uL — ABNORMAL LOW (ref 0.7–4.0)
MCH: 27.3 pg (ref 26.0–34.0)
MCHC: 31.4 g/dL (ref 30.0–36.0)
MCV: 86.9 fL (ref 80.0–100.0)
Monocytes Absolute: 0.5 10*3/uL (ref 0.1–1.0)
Monocytes Relative: 10 %
Neutro Abs: 4.5 10*3/uL (ref 1.7–7.7)
Neutrophils Relative %: 81 %
Platelets: 226 10*3/uL (ref 150–400)
RBC: 2.6 MIL/uL — ABNORMAL LOW (ref 4.22–5.81)
RDW: 15.7 % — ABNORMAL HIGH (ref 11.5–15.5)
WBC: 5.6 10*3/uL (ref 4.0–10.5)
nRBC: 0 % (ref 0.0–0.2)

## 2019-06-12 LAB — BASIC METABOLIC PANEL
Anion gap: 8 (ref 5–15)
Anion gap: 10 (ref 5–15)
BUN: 57 mg/dL — ABNORMAL HIGH (ref 8–23)
BUN: 60 mg/dL — ABNORMAL HIGH (ref 8–23)
CO2: 19 mmol/L — ABNORMAL LOW (ref 22–32)
CO2: 17 mmol/L — ABNORMAL LOW (ref 22–32)
Calcium: 8.6 mg/dL — ABNORMAL LOW (ref 8.9–10.3)
Calcium: 8.5 mg/dL — ABNORMAL LOW (ref 8.9–10.3)
Chloride: 116 mmol/L — ABNORMAL HIGH (ref 98–111)
Chloride: 117 mmol/L — ABNORMAL HIGH (ref 98–111)
Creatinine, Ser: 3.35 mg/dL — ABNORMAL HIGH (ref 0.61–1.24)
Creatinine, Ser: 3.32 mg/dL — ABNORMAL HIGH (ref 0.61–1.24)
GFR calc Af Amer: 19 mL/min — ABNORMAL LOW (ref >60)
GFR calc Af Amer: 19 mL/min — ABNORMAL LOW (ref >60)
GFR calc non Af Amer: 17 mL/min — ABNORMAL LOW (ref >60)
GFR calc non Af Amer: 17 mL/min — ABNORMAL LOW (ref >60)
Glucose, Bld: 192 mg/dL — ABNORMAL HIGH (ref 70–99)
Glucose, Bld: 253 mg/dL — ABNORMAL HIGH (ref 70–99)
Potassium: 4.4 mmol/L (ref 3.5–5.1)
Potassium: 4.4 mmol/L (ref 3.5–5.1)
Sodium: 143 mmol/L (ref 135–145)
Sodium: 144 mmol/L (ref 135–145)

## 2019-06-12 LAB — PHOSPHORUS
Phosphorus: 3.3 mg/dL (ref 2.5–4.6)
Phosphorus: 3.3 mg/dL (ref 2.5–4.6)

## 2019-06-12 LAB — BETA-HYDROXYBUTYRIC ACID
Beta-Hydroxybutyric Acid: 0.08 mmol/L (ref 0.05–0.27)
Beta-Hydroxybutyric Acid: 0.09 mmol/L (ref 0.05–0.27)

## 2019-06-12 LAB — MAGNESIUM
Magnesium: 2.1 mg/dL (ref 1.7–2.4)
Magnesium: 2.1 mg/dL (ref 1.7–2.4)

## 2019-06-12 MED ORDER — DEXTROSE 50 % IV SOLN: 0.0000 mL | INTRAVENOUS | Status: DC | PRN

## 2019-06-12 MED ORDER — INSULIN ASPART 100 UNIT/ML ~~LOC~~ SOLN
1.0000 [IU] | SUBCUTANEOUS | Status: DC
  Administered 2019-06-12 (×2): 3 [IU] via SUBCUTANEOUS

## 2019-06-12 MED ORDER — DEXTROSE-NACL 5-0.45 % IV SOLN: INTRAVENOUS | Status: DC

## 2019-06-12 MED ORDER — INSULIN ASPART 100 UNIT/ML ~~LOC~~ SOLN: 3.0000 [IU] | SUBCUTANEOUS | Status: DC

## 2019-06-12 MED ORDER — SODIUM CHLORIDE 0.9 % IV SOLN: INTRAVENOUS | Status: DC

## 2019-06-12 MED ORDER — INSULIN REGULAR(HUMAN) IN NACL 100-0.9 UT/100ML-% IV SOLN
INTRAVENOUS | Status: DC
  Administered 2019-06-12: 9 [IU]/h via INTRAVENOUS
  Filled 2019-06-12: qty 100

## 2019-06-12 MED ORDER — PANTOPRAZOLE SODIUM 40 MG IV SOLR
40.0000 mg | INTRAVENOUS | Status: DC
  Administered 2019-06-12 – 2019-06-18 (×7): 40 mg via INTRAVENOUS
  Filled 2019-06-12 (×7): qty 40

## 2019-06-12 MED ORDER — INSULIN GLARGINE 100 UNIT/ML ~~LOC~~ SOLN
20.0000 [IU] | Freq: Every day | SUBCUTANEOUS | Status: DC
  Administered 2019-06-12: 20 [IU] via SUBCUTANEOUS
  Filled 2019-06-12 (×2): qty 0.2

## 2019-06-12 MED ORDER — INSULIN GLARGINE 100 UNIT/ML ~~LOC~~ SOLN
10.0000 [IU] | Freq: Every day | SUBCUTANEOUS | Status: DC
  Filled 2019-06-12: qty 0.1

## 2019-06-12 MED ORDER — INSULIN ASPART 100 UNIT/ML ~~LOC~~ SOLN
3.0000 [IU] | SUBCUTANEOUS | Status: DC
  Administered 2019-06-12 (×3): 3 [IU] via SUBCUTANEOUS

## 2019-06-12 MED ORDER — INSULIN ASPART 100 UNIT/ML ~~LOC~~ SOLN: 0.0000 [IU] | Freq: Three times a day (TID) | SUBCUTANEOUS | Status: DC
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
## 2019-06-12 NOTE — Progress Notes (Signed)
Consulted the on-call diabetes coordinator on how to transition pt off of Endotool. See new orders.

## 2019-06-12 NOTE — Progress Notes (Signed)
Inpatient Diabetes Program Recommendations  AACE/ADA: New Consensus Statement on Inpatient Glycemic Control (2015)  Target Ranges:  Prepandial:   less than 140 mg/dL      Peak postprandial:   less than 180 mg/dL (1-2 hours)      Critically ill patients:  140 - 180 mg/dL   Lab Results  Component Value Date   GLUCAP 193 (H) 06/12/2019   HGBA1C 8.6 (H) 06/16/2019    Review of Glycemic Control Results for Zachary Hoffman, Zachary Hoffman (MRN [id number]) as of 06/12/2019 09:06  Ref. Range 06/12/2019 04:02 06/12/2019 04:59 06/12/2019 06:44  Glucose-Capillary Latest Ref Range: 70 - 99 mg/dL 191 (H) 182 (H) 193 (H)   Diabetes history: Type 2 DM Outpatient Diabetes medications: Apidra 0-10 SSI, Lantus 20 units BID Current orders for Inpatient glycemic control: IV insulin  Inpatient Diabetes Program Recommendations:   Noted consult and received page regarding transition orders.   Consider instead the following: -Lantus 20 units two hours prior to discontinuation of IV insulin, then QD to follow. -Novolog 1-3 units Q4H -Novolog 3 units Q4H for tube feed coverage (to be stopped or held if tube feeds are stopped).  -Discontinue carb modified diet, as patient is intubated.  PA paged, awaiting orders.   Thanks, Bronson Curb, MSN, RNC-OB Diabetes Coordinator [phone] (8a-5p)

## 2019-06-12 NOTE — Progress Notes (Signed)
PT Cancellation/Discharge Note  Patient Details Name: Zachary Hoffman MRN: [id number] DOB: May 09, 1940   Cancelled Treatment:    Reason Eval/Treat Not Completed: PT screened, no needs identified, will sign off  Noted pt currently on propofol; pinpoint pupils. Please reorder PT Consult if becomes medically appropriate.   Arby Barrette, PT Pager [phone]   Rexanne Mano 06/12/2019, 12:53 PM

## 2019-06-12 NOTE — Progress Notes (Signed)
Pt has had 2 consecutive CBGs >270, Dr.Ramirez called since Diabetes Coordinator is unavailable after hours. Change to resistant scale per order.

## 2019-06-13 ENCOUNTER — Encounter (HOSPITAL_COMMUNITY): Payer: Medicare Other

## 2019-06-13 LAB — CBC
HCT: 23.2 % — ABNORMAL LOW (ref 39.0–52.0)
Hemoglobin: 7.2 g/dL — ABNORMAL LOW (ref 13.0–17.0)
MCH: 26.9 pg (ref 26.0–34.0)
MCHC: 31 g/dL (ref 30.0–36.0)
MCV: 86.6 fL (ref 80.0–100.0)
Platelets: 246 10*3/uL (ref 150–400)
RBC: 2.68 MIL/uL — ABNORMAL LOW (ref 4.22–5.81)
RDW: 15.9 % — ABNORMAL HIGH (ref 11.5–15.5)
WBC: 5.5 10*3/uL (ref 4.0–10.5)
nRBC: 0 % (ref 0.0–0.2)

## 2019-06-13 LAB — GLUCOSE, CAPILLARY
Glucose-Capillary: 144 mg/dL — ABNORMAL HIGH (ref 70–99)
Glucose-Capillary: 150 mg/dL — ABNORMAL HIGH (ref 70–99)
Glucose-Capillary: 155 mg/dL — ABNORMAL HIGH (ref 70–99)
Glucose-Capillary: 161 mg/dL — ABNORMAL HIGH (ref 70–99)
Glucose-Capillary: 162 mg/dL — ABNORMAL HIGH (ref 70–99)
Glucose-Capillary: 162 mg/dL — ABNORMAL HIGH (ref 70–99)
Glucose-Capillary: 163 mg/dL — ABNORMAL HIGH (ref 70–99)
Glucose-Capillary: 164 mg/dL — ABNORMAL HIGH (ref 70–99)
Glucose-Capillary: 168 mg/dL — ABNORMAL HIGH (ref 70–99)
Glucose-Capillary: 175 mg/dL — ABNORMAL HIGH (ref 70–99)
Glucose-Capillary: 181 mg/dL — ABNORMAL HIGH (ref 70–99)
Glucose-Capillary: 183 mg/dL — ABNORMAL HIGH (ref 70–99)
Glucose-Capillary: 191 mg/dL — ABNORMAL HIGH (ref 70–99)
Glucose-Capillary: 219 mg/dL — ABNORMAL HIGH (ref 70–99)

## 2019-06-13 LAB — TYPE AND SCREEN
ABO/RH(D): O POS
Antibody Screen: NEGATIVE

## 2019-06-13 LAB — BASIC METABOLIC PANEL
Anion gap: 8 (ref 5–15)
BUN: 68 mg/dL — ABNORMAL HIGH (ref 8–23)
CO2: 20 mmol/L — ABNORMAL LOW (ref 22–32)
Calcium: 8.5 mg/dL — ABNORMAL LOW (ref 8.9–10.3)
Chloride: 116 mmol/L — ABNORMAL HIGH (ref 98–111)
Creatinine, Ser: 3.21 mg/dL — ABNORMAL HIGH (ref 0.61–1.24)
GFR calc Af Amer: 20 mL/min — ABNORMAL LOW (ref >60)
GFR calc non Af Amer: 18 mL/min — ABNORMAL LOW (ref >60)
Glucose, Bld: 165 mg/dL — ABNORMAL HIGH (ref 70–99)
Potassium: 4.6 mmol/L (ref 3.5–5.1)
Sodium: 144 mmol/L (ref 135–145)

## 2019-06-13 LAB — CBC WITH DIFFERENTIAL/PLATELET
Abs Immature Granulocytes: 0.07 10*3/uL (ref 0.00–0.07)
Basophils Absolute: 0 10*3/uL (ref 0.0–0.1)
Basophils Relative: 0 %
Eosinophils Absolute: 0 10*3/uL (ref 0.0–0.5)
Eosinophils Relative: 1 %
HCT: 22.1 % — ABNORMAL LOW (ref 39.0–52.0)
Hemoglobin: 7 g/dL — ABNORMAL LOW (ref 13.0–17.0)
Immature Granulocytes: 1 %
Lymphocytes Relative: 14 %
Lymphs Abs: 0.7 10*3/uL (ref 0.7–4.0)
MCH: 27.6 pg (ref 26.0–34.0)
MCHC: 31.7 g/dL (ref 30.0–36.0)
MCV: 87 fL (ref 80.0–100.0)
Monocytes Absolute: 0.5 10*3/uL (ref 0.1–1.0)
Monocytes Relative: 10 %
Neutro Abs: 3.7 10*3/uL (ref 1.7–7.7)
Neutrophils Relative %: 74 %
Platelets: 228 10*3/uL (ref 150–400)
RBC: 2.54 MIL/uL — ABNORMAL LOW (ref 4.22–5.81)
RDW: 15.8 % — ABNORMAL HIGH (ref 11.5–15.5)
WBC: 5 10*3/uL (ref 4.0–10.5)
nRBC: 0.6 % — ABNORMAL HIGH (ref 0.0–0.2)

## 2019-06-13 LAB — ABO/RH: ABO/RH(D): O POS

## 2019-06-13 MED ORDER — INSULIN ASPART 100 UNIT/ML ~~LOC~~ SOLN: 0.0000 [IU] | Freq: Three times a day (TID) | SUBCUTANEOUS | Status: DC

## 2019-06-13 MED ORDER — INSULIN GLARGINE 100 UNIT/ML ~~LOC~~ SOLN
20.0000 [IU] | Freq: Two times a day (BID) | SUBCUTANEOUS | Status: DC
  Administered 2019-06-13 – 2019-06-15 (×6): 20 [IU] via SUBCUTANEOUS
  Filled 2019-06-13 (×8): qty 0.2

## 2019-06-13 MED ORDER — FUROSEMIDE 10 MG/ML IJ SOLN
20.0000 mg | Freq: Every day | INTRAMUSCULAR | Status: DC
  Administered 2019-06-13 – 2019-06-18 (×6): 20 mg via INTRAVENOUS
  Filled 2019-06-13 (×6): qty 2

## 2019-06-13 MED ORDER — INSULIN ASPART 100 UNIT/ML ~~LOC~~ SOLN
0.0000 [IU] | SUBCUTANEOUS | Status: DC
  Administered 2019-06-13 – 2019-06-15 (×6): 2 [IU] via SUBCUTANEOUS
  Administered 2019-06-15 (×3): 3 [IU] via SUBCUTANEOUS
  Administered 2019-06-16 (×4): 2 [IU] via SUBCUTANEOUS
  Administered 2019-06-16 (×2): 1 [IU] via SUBCUTANEOUS
  Administered 2019-06-17: 3 [IU] via SUBCUTANEOUS
  Administered 2019-06-17: 5 [IU] via SUBCUTANEOUS
  Administered 2019-06-17: 3 [IU] via SUBCUTANEOUS
  Administered 2019-06-17: 5 [IU] via SUBCUTANEOUS
  Administered 2019-06-17: 3 [IU] via SUBCUTANEOUS
  Administered 2019-06-17: 5 [IU] via SUBCUTANEOUS
  Administered 2019-06-18: 9 [IU] via SUBCUTANEOUS
  Administered 2019-06-18: 3 [IU] via SUBCUTANEOUS
  Administered 2019-06-18 (×2): 9 [IU] via SUBCUTANEOUS

## 2019-06-13 MED ORDER — INSULIN ASPART 100 UNIT/ML ~~LOC~~ SOLN
4.0000 [IU] | SUBCUTANEOUS | Status: DC
  Administered 2019-06-13 – 2019-06-18 (×24): 4 [IU] via SUBCUTANEOUS

## 2019-06-13 MED ORDER — DEXTROSE-NACL 5-0.45 % IV SOLN: INTRAVENOUS | Status: DC

## 2019-06-13 MED FILL — Medication: Qty: 1 | Status: AC

## 2019-06-13 NOTE — Progress Notes (Signed)
Patient ID: Zachary Hoffman, male   DOB: 06-26-1940, 79 y.o.   MRN: [id number] Patient had an episode of emesis so we will hold tube feeds.  D5 started in light of insulin drip.  I spoke with his wife at the bedside and we called one of his daughters on the phone.  I reiterated the presence of bilateral posterior circulation strokes that Dr. Marcello Moores discussed with them yesterday including the associated very poor prognosis for neurologic recovery.  This will impair his ability to come off of the ventilator.  I advised them to begin having discussions regarding what his wishes would be regarding living on machines in a facility.  For now, they do not want to make him a DNR.  I will continue to discuss goals of care with them daily.  Georganna Skeans, MD, MPH, FACS Please use [email or web address] to contact on call provider
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
## 2019-06-13 NOTE — Progress Notes (Signed)
Per Diabetes coordinator, recommendations to increase lantus 20U BID, add novolog sensitive/renal scale d/t renal issues, and add 4U TF coverage. May stop D5 1/2NS per MD.  Orders added and shortly after pt started vomitting. TF stopped and MD notified. Order to restart D5 1/2NS. Diabetes coordinator aware.

## 2019-06-13 NOTE — Progress Notes (Signed)
EndoTool criteria has been met, per Dr. Rosendo Gros, keep the insulin gtt at the current rate and continue to monitor CBG Q1.

## 2019-06-13 NOTE — Progress Notes (Signed)
OT Cancellation Note  Patient Details Name: Zachary Hoffman MRN: [id number] DOB: 1940/06/11   Cancelled Treatment:    Reason Eval/Treat Not Completed: Medical issues which prohibited therapy. Pt remains on vent. Poor prognosis per trauma. Will sign off as pt not medically appropriate. Please re-consult as pt medically ready. Thank you.  [place], OTR/L Acute Rehab Pager: [phone] Office: [phone] 06/13/2019, 4:44 PM
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
## 2019-06-14 ENCOUNTER — Encounter: Payer: Self-pay | Admitting: *Deleted

## 2019-06-14 ENCOUNTER — Encounter (HOSPITAL_COMMUNITY): Payer: Medicare Other

## 2019-06-14 LAB — GLUCOSE, CAPILLARY
Glucose-Capillary: 108 mg/dL — ABNORMAL HIGH (ref 70–99)
Glucose-Capillary: 115 mg/dL — ABNORMAL HIGH (ref 70–99)
Glucose-Capillary: 159 mg/dL — ABNORMAL HIGH (ref 70–99)
Glucose-Capillary: 169 mg/dL — ABNORMAL HIGH (ref 70–99)
Glucose-Capillary: 174 mg/dL — ABNORMAL HIGH (ref 70–99)
Glucose-Capillary: 192 mg/dL — ABNORMAL HIGH (ref 70–99)
Glucose-Capillary: 99 mg/dL (ref 70–99)

## 2019-06-14 LAB — CBC WITH DIFFERENTIAL/PLATELET
Abs Immature Granulocytes: 0.03 10*3/uL (ref 0.00–0.07)
Basophils Absolute: 0 10*3/uL (ref 0.0–0.1)
Basophils Relative: 0 %
Eosinophils Absolute: 0.1 10*3/uL (ref 0.0–0.5)
Eosinophils Relative: 1 %
HCT: 22.9 % — ABNORMAL LOW (ref 39.0–52.0)
Hemoglobin: 7 g/dL — ABNORMAL LOW (ref 13.0–17.0)
Immature Granulocytes: 1 %
Lymphocytes Relative: 15 %
Lymphs Abs: 0.8 10*3/uL (ref 0.7–4.0)
MCH: 26.7 pg (ref 26.0–34.0)
MCHC: 30.6 g/dL (ref 30.0–36.0)
MCV: 87.4 fL (ref 80.0–100.0)
Monocytes Absolute: 0.4 10*3/uL (ref 0.1–1.0)
Monocytes Relative: 8 %
Neutro Abs: 3.8 10*3/uL (ref 1.7–7.7)
Neutrophils Relative %: 75 %
Platelets: 235 10*3/uL (ref 150–400)
RBC: 2.62 MIL/uL — ABNORMAL LOW (ref 4.22–5.81)
RDW: 16 % — ABNORMAL HIGH (ref 11.5–15.5)
WBC: 5.1 10*3/uL (ref 4.0–10.5)
nRBC: 0 % (ref 0.0–0.2)

## 2019-06-14 LAB — BASIC METABOLIC PANEL
Anion gap: 12 (ref 5–15)
BUN: 65 mg/dL — ABNORMAL HIGH (ref 8–23)
CO2: 19 mmol/L — ABNORMAL LOW (ref 22–32)
Calcium: 8.6 mg/dL — ABNORMAL LOW (ref 8.9–10.3)
Chloride: 114 mmol/L — ABNORMAL HIGH (ref 98–111)
Creatinine, Ser: 2.87 mg/dL — ABNORMAL HIGH (ref 0.61–1.24)
GFR calc Af Amer: 23 mL/min — ABNORMAL LOW (ref >60)
GFR calc non Af Amer: 20 mL/min — ABNORMAL LOW (ref >60)
Glucose, Bld: 215 mg/dL — ABNORMAL HIGH (ref 70–99)
Potassium: 4.3 mmol/L (ref 3.5–5.1)
Sodium: 145 mmol/L (ref 135–145)

## 2019-06-14 LAB — TRIGLYCERIDES: Triglycerides: 113 mg/dL (ref <150)

## 2019-06-14 MED ORDER — AMLODIPINE BESYLATE 10 MG PO TABS
10.0000 mg | ORAL_TABLET | Freq: Every day | ORAL | Status: DC
  Administered 2019-06-14 – 2019-06-15 (×2): 10 mg
  Filled 2019-06-14 (×4): qty 1

## 2019-06-14 MED ORDER — FREE WATER
200.0000 mL | Freq: Three times a day (TID) | Status: DC
  Administered 2019-06-14 – 2019-06-18 (×14): 200 mL
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
## 2019-06-14 NOTE — Progress Notes (Signed)
Subjective: Patient sedated and on ventilator.  Objective: Vital signs in last 24 hours: Temp:  [96.8 F (36 C)-97.8 F (36.6 C)] 97.6 F (36.4 C) (03/09 0400) Pulse Rate:  [83-107] 88 (03/09 0744) Resp:  [16-31] 18 (03/09 0744) BP: (117-178)/(59-101) 156/59 (03/09 0744) SpO2:  [100 %] 100 % (03/09 0744) FiO2 (%):  [30 %] 30 % (03/09 0745)  Intake/Output from previous day: 03/08 0701 - 03/09 0700 In: 2213 [I.V.:1583; NG/GT:630] Out: 3700 [Urine:2900; Emesis/NG output:800] Intake/Output this shift: No intake/output data recorded.  Neurologic exam: flickers in extremities with noxious stimuli, does not fc or open eyes  Lab Results: Lab Results  Component Value Date   WBC 5.1 06/14/2019   HGB 7.0 (L) 06/14/2019   HCT 22.9 (L) 06/14/2019   MCV 87.4 06/14/2019   PLT 235 06/14/2019   Lab Results  Component Value Date   INR 1.5 (H) 06/11/2019   BMET Lab Results  Component Value Date   NA 145 06/14/2019   K 4.3 06/14/2019   CL 114 (H) 06/14/2019   CO2 19 (L) 06/14/2019   GLUCOSE 215 (H) 06/14/2019   BUN 65 (H) 06/14/2019   CREATININE 2.87 (H) 06/14/2019   CALCIUM 8.6 (L) 06/14/2019    Studies/Results: No results found.  Assessment/Plan: Status post acdf for cervical fracture. Still no improvement in neurologic status. Prognosis seems to be poor, would recommend comfort care. Looks as though trauma has tried to have this discussion with the family and they are not ready yet.    LOS: 6 days    Ocie Cornfield Kayzlee Wirtanen 06/14/2019, 8:14 AM

## 2019-06-14 NOTE — Progress Notes (Signed)
Inpatient Diabetes Program Recommendations  AACE/ADA: New Consensus Statement on Inpatient Glycemic Control (2015)  Target Ranges:  Prepandial:   less than 140 mg/dL      Peak postprandial:   less than 180 mg/dL (1-2 hours)      Critically ill patients:  140 - 180 mg/dL   Lab Results  Component Value Date   GLUCAP 99 06/14/2019   HGBA1C 8.6 (H) 06/23/2019    Review of Glycemic Control Results for [name], Zachary Hoffman (MRN [id number]) as of 06/14/2019 09:53  Ref. Range 06/13/2019 23:38 06/14/2019 03:43 06/14/2019 08:04 06/14/2019 09:17  Glucose-Capillary Latest Ref Range: 70 - 99 mg/dL 219 (H) 192 (H) 108 (H) 99   Diabetes history:Type 2 DM Outpatient Diabetes medications:Apidra 0-10 SSI, Lantus 20 units BID Current orders for Inpatient glycemic control:IV insulin  Inpatient Diabetes Program Recommendations:  Tube feeds off. However, noted for last 3 doses patient received tube feed coverage instead of Q4H correction. Please ensure tube feed coverage only given if tube feeds are running. Continue with current orders at this time.   Thanks, Bronson Curb, MSN, RNC-OB Diabetes Coordinator [phone] (8a-5p)

## 2019-06-14 NOTE — Progress Notes (Signed)
Patient ID: Zachary Hoffman, male   DOB: 04/06/1941, 79 y.o.   MRN: [id number] I met with his wife at the bedside and we called his daughter. I updated them. They are going to try to have a family meeting by phone tonight to further decide on goals of care. I explained the potential process of trach/PEG and vent SNF placement and emphasized that they need to think about what Dolores would want. Georganna Skeans, MD, MPH, FACS Please use [email or web address] to contact on call provider

## 2019-06-15 ENCOUNTER — Inpatient Hospital Stay (HOSPITAL_COMMUNITY): Payer: No Typology Code available for payment source

## 2019-06-15 LAB — BASIC METABOLIC PANEL
Anion gap: 11 (ref 5–15)
BUN: 70 mg/dL — ABNORMAL HIGH (ref 8–23)
CO2: 22 mmol/L (ref 22–32)
Calcium: 8.9 mg/dL (ref 8.9–10.3)
Chloride: 115 mmol/L — ABNORMAL HIGH (ref 98–111)
Creatinine, Ser: 2.75 mg/dL — ABNORMAL HIGH (ref 0.61–1.24)
GFR calc Af Amer: 24 mL/min — ABNORMAL LOW (ref >60)
GFR calc non Af Amer: 21 mL/min — ABNORMAL LOW (ref >60)
Glucose, Bld: 125 mg/dL — ABNORMAL HIGH (ref 70–99)
Potassium: 4.7 mmol/L (ref 3.5–5.1)
Sodium: 148 mmol/L — ABNORMAL HIGH (ref 135–145)

## 2019-06-15 LAB — CBC WITH DIFFERENTIAL/PLATELET
Abs Immature Granulocytes: 0.05 10*3/uL (ref 0.00–0.07)
Basophils Absolute: 0 10*3/uL (ref 0.0–0.1)
Basophils Relative: 0 %
Eosinophils Absolute: 0.1 10*3/uL (ref 0.0–0.5)
Eosinophils Relative: 2 %
HCT: 22.3 % — ABNORMAL LOW (ref 39.0–52.0)
Hemoglobin: 7 g/dL — ABNORMAL LOW (ref 13.0–17.0)
Immature Granulocytes: 1 %
Lymphocytes Relative: 13 %
Lymphs Abs: 0.7 10*3/uL (ref 0.7–4.0)
MCH: 27.3 pg (ref 26.0–34.0)
MCHC: 31.4 g/dL (ref 30.0–36.0)
MCV: 87.1 fL (ref 80.0–100.0)
Monocytes Absolute: 0.6 10*3/uL (ref 0.1–1.0)
Monocytes Relative: 11 %
Neutro Abs: 3.7 10*3/uL (ref 1.7–7.7)
Neutrophils Relative %: 73 %
Platelets: 225 10*3/uL (ref 150–400)
RBC: 2.56 MIL/uL — ABNORMAL LOW (ref 4.22–5.81)
RDW: 15.9 % — ABNORMAL HIGH (ref 11.5–15.5)
WBC: 5.1 10*3/uL (ref 4.0–10.5)
nRBC: 0 % (ref 0.0–0.2)

## 2019-06-15 LAB — GLUCOSE, CAPILLARY
Glucose-Capillary: 110 mg/dL — ABNORMAL HIGH (ref 70–99)
Glucose-Capillary: 82 mg/dL (ref 70–99)
Glucose-Capillary: 158 mg/dL — ABNORMAL HIGH (ref 70–99)
Glucose-Capillary: 216 mg/dL — ABNORMAL HIGH (ref 70–99)
Glucose-Capillary: 239 mg/dL — ABNORMAL HIGH (ref 70–99)

## 2019-06-15 MED ORDER — METOPROLOL TARTRATE 5 MG/5ML IV SOLN
5.0000 mg | Freq: Once | INTRAVENOUS | Status: AC
  Administered 2019-06-15: 5 mg via INTRAVENOUS

## 2019-06-15 NOTE — Progress Notes (Signed)
Subjective: Patient sedated and on ventilator  Objective: Vital signs in last 24 hours: Temp:  [97.4 F (36.3 C)-97.7 F (36.5 C)] 97.4 F (36.3 C) (03/10 0800) Pulse Rate:  [82-111] 94 (03/10 0822) Resp:  [12-24] 13 (03/10 0822) BP: (128-155)/(54-69) 142/65 (03/10 0822) SpO2:  [100 %] 100 % (03/10 0822) FiO2 (%):  [30 %] 30 % (03/10 0822) Weight:  [88.9 kg] 88.9 kg (03/10 0500)  Intake/Output from previous day: 03/09 0701 - 03/10 0700 In: 1891.2 [I.V.:1291.2; NG/GT:600] Out: 2275 [Urine:2225; Emesis/NG output:50] Intake/Output this shift: No intake/output data recorded.  Neurologic status unchanged, unable to [facility], flickers to noxious stimuli in lower extremities  Lab Results: Lab Results  Component Value Date   WBC 5.1 06/15/2019   HGB 7.0 (L) 06/15/2019   HCT 22.3 (L) 06/15/2019   MCV 87.1 06/15/2019   PLT 225 06/15/2019   Lab Results  Component Value Date   INR 1.5 (H) 06/11/2019   BMET Lab Results  Component Value Date   NA 148 (H) 06/15/2019   K 4.7 06/15/2019   CL 115 (H) 06/15/2019   CO2 22 06/15/2019   GLUCOSE 125 (H) 06/15/2019   BUN 70 (H) 06/15/2019   CREATININE 2.75 (H) 06/15/2019   CALCIUM 8.9 06/15/2019    Studies/Results: No results found.  Assessment/Plan: No change in neuro status, could consider MRI brain for prognostic determination. Per trauma, family discussing goals of care tonight. Etiology of cardiorespiratory arrest is unknown but doesn't appear secondary postoperative airway compromise at the surgical site. Prognosis for functional recovery is grim.    LOS: 7 days    Ocie Cornfield [facility] 06/15/2019, 8:39 AM
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
## 2019-06-16 ENCOUNTER — Inpatient Hospital Stay (HOSPITAL_COMMUNITY): Payer: No Typology Code available for payment source

## 2019-06-16 LAB — BASIC METABOLIC PANEL
Anion gap: 10 (ref 5–15)
BUN: 91 mg/dL — ABNORMAL HIGH (ref 8–23)
CO2: 21 mmol/L — ABNORMAL LOW (ref 22–32)
Calcium: 8.5 mg/dL — ABNORMAL LOW (ref 8.9–10.3)
Chloride: 116 mmol/L — ABNORMAL HIGH (ref 98–111)
Creatinine, Ser: 3 mg/dL — ABNORMAL HIGH (ref 0.61–1.24)
GFR calc Af Amer: 22 mL/min — ABNORMAL LOW (ref >60)
GFR calc non Af Amer: 19 mL/min — ABNORMAL LOW (ref >60)
Glucose, Bld: 169 mg/dL — ABNORMAL HIGH (ref 70–99)
Potassium: 4.6 mmol/L (ref 3.5–5.1)
Sodium: 147 mmol/L — ABNORMAL HIGH (ref 135–145)

## 2019-06-16 LAB — GLUCOSE, CAPILLARY
Glucose-Capillary: 140 mg/dL — ABNORMAL HIGH (ref 70–99)
Glucose-Capillary: 143 mg/dL — ABNORMAL HIGH (ref 70–99)
Glucose-Capillary: 157 mg/dL — ABNORMAL HIGH (ref 70–99)
Glucose-Capillary: 159 mg/dL — ABNORMAL HIGH (ref 70–99)
Glucose-Capillary: 175 mg/dL — ABNORMAL HIGH (ref 70–99)
Glucose-Capillary: 195 mg/dL — ABNORMAL HIGH (ref 70–99)
Glucose-Capillary: 211 mg/dL — ABNORMAL HIGH (ref 70–99)

## 2019-06-16 LAB — CBC WITH DIFFERENTIAL/PLATELET
Abs Immature Granulocytes: 0.06 10*3/uL (ref 0.00–0.07)
Basophils Absolute: 0 10*3/uL (ref 0.0–0.1)
Basophils Relative: 0 %
Eosinophils Absolute: 0.1 10*3/uL (ref 0.0–0.5)
Eosinophils Relative: 2 %
HCT: 22.4 % — ABNORMAL LOW (ref 39.0–52.0)
Hemoglobin: 6.9 g/dL — CL (ref 13.0–17.0)
Immature Granulocytes: 1 %
Lymphocytes Relative: 16 %
Lymphs Abs: 0.8 10*3/uL (ref 0.7–4.0)
MCH: 26.8 pg (ref 26.0–34.0)
MCHC: 30.8 g/dL (ref 30.0–36.0)
MCV: 87.2 fL (ref 80.0–100.0)
Monocytes Absolute: 0.8 10*3/uL (ref 0.1–1.0)
Monocytes Relative: 16 %
Neutro Abs: 3.4 10*3/uL (ref 1.7–7.7)
Neutrophils Relative %: 65 %
Platelets: 238 10*3/uL (ref 150–400)
RBC: 2.57 MIL/uL — ABNORMAL LOW (ref 4.22–5.81)
RDW: 15.8 % — ABNORMAL HIGH (ref 11.5–15.5)
WBC: 5.1 10*3/uL (ref 4.0–10.5)
nRBC: 0 % (ref 0.0–0.2)

## 2019-06-16 MED ORDER — INSULIN GLARGINE 100 UNIT/ML ~~LOC~~ SOLN
24.0000 [IU] | Freq: Two times a day (BID) | SUBCUTANEOUS | Status: DC
  Administered 2019-06-16 – 2019-06-18 (×5): 24 [IU] via SUBCUTANEOUS
  Filled 2019-06-16 (×6): qty 0.24

## 2019-06-16 MED ORDER — METOPROLOL TARTRATE 5 MG/5ML IV SOLN
5.0000 mg | Freq: Once | INTRAVENOUS | Status: AC
  Administered 2019-06-16: 5 mg via INTRAVENOUS

## 2019-06-16 NOTE — Progress Notes (Signed)
Pt with worsening neuro exam. GCS 3 with flickers of muscle movement to stimulation in RLE and RUE. R pupil is nonreactive and L pupil is minimally reactive. NSU NP at bedside. Dr. Grandville Silos in to evaluate as well. Order for stat CT scan.

## 2019-06-16 NOTE — Progress Notes (Signed)
Patient ID: Zachary Hoffman, male   DOB: 1940-08-03, 79 y.o.   MRN: [id number] I spoke with his wife at the bedside and his daughter on the phone. I reviewed the CT head findings and let them know this is not survivable. I recommended comfort care. They are going to try to have his children speak with Dr. Ronnald Ramp on the phone. They do not want to make him DNR at this time.  Georganna Skeans, MD, MPH, FACS Please use [email or web address] to contact on call provider

## 2019-06-16 NOTE — Progress Notes (Signed)
Subjective: Patient ventilated and not responsive  Objective: Vital signs in last 24 hours: Temp:  [96.4 F (35.8 C)-99.1 F (37.3 C)] 99.1 F (37.3 C) (03/11 0800) Pulse Rate:  [77-142] 94 (03/11 0800) Resp:  [11-23] 11 (03/11 0800) BP: (99-154)/(50-89) 126/57 (03/11 0800) SpO2:  [100 %] 100 % (03/11 0800) FiO2 (%):  [30 %] 30 % (03/11 0742) Weight:  [87.7 kg] 87.7 kg (03/11 0500)  Intake/Output from previous day: 03/10 0701 - 03/11 0700 In: 1884.3 [I.V.:36.9; NG/GT:1847.3] Out: 2010 [Urine:2010] Intake/Output this shift: No intake/output data recorded.  Neurologic exam: Right pupil fixed and blown, left pupil 3 and sluggish, does not FC, flickers right arm and right leg to noxious stimuli  Lab Results: Lab Results  Component Value Date   WBC 5.1 06/15/2019   HGB 7.0 (L) 06/15/2019   HCT 22.3 (L) 06/15/2019   MCV 87.1 06/15/2019   PLT 225 06/15/2019   Lab Results  Component Value Date   INR 1.5 (H) 06/11/2019   BMET Lab Results  Component Value Date   NA 148 (H) 06/15/2019   K 4.7 06/15/2019   CL 115 (H) 06/15/2019   CO2 22 06/15/2019   GLUCOSE 125 (H) 06/15/2019   BUN 70 (H) 06/15/2019   CREATININE 2.75 (H) 06/15/2019   CALCIUM 8.9 06/15/2019    Studies/Results: DG CHEST PORT 1 VIEW  Result Date: 06/15/2019 CLINICAL DATA:  Respiratory failure. EXAM: PORTABLE CHEST 1 VIEW COMPARISON:  Chest x-ray 06/12/2019 FINDINGS: The endotracheal tube, NG tube and left subclavian catheters are stable. The cardiac silhouette, mediastinal and hilar contours are stable. Low lung volumes with vascular crowding and streaky areas of atelectasis. Possible small left pleural effusion. No pneumothorax. IMPRESSION: 1. Stable support apparatus. 2. Low lung volumes with vascular crowding and streaky atelectasis. Electronically Signed   By: Marijo Sanes M.D.   On: 06/15/2019 08:39    Assessment/Plan: Nurse this morning states that his right pupil is fixed and blown now, this is a  change from previous neurologic exam. Will get a stat head CT. Has been off of sedation for over 24 hours now and neurologic function has not improved.    LOS: 8 days    Ocie Cornfield Kyrian Stage 06/16/2019, 8:30 AM

## 2019-06-16 NOTE — Progress Notes (Signed)
Assisted tele visit to patient with 5 family members.  Maryelizabeth Rowan, RN
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
## 2019-06-16 NOTE — Progress Notes (Signed)
Patient ID: Zachary Hoffman, male   DOB: 05-01-1940, 79 y.o.   MRN: [id number] I reviewed his CT scan of the head.  He has infarcted basically the entire right hemisphere and the posterior fossa so he has infarcts in the right MCA and PCA territory and maybe also part of the [place] territory.    This is not related to the left vertebral artery injury. There is significant shift of the midline structures and trapping of the ventricle.  There is no intervention here that would be helpful and I think all treatment at this point is futile.  I would recommend comfort measures only.  I have spoken with the wife this morning and explained the findings on the CT scan.  She states she is coming to the hospital to see Zachary Hoffman.

## 2019-06-16 NOTE — Progress Notes (Signed)
Assisted tele visit to patient with family member.  Elise Gladden P, RN  

## 2019-06-17 DIAGNOSIS — Z515 Encounter for palliative care: Secondary | ICD-10-CM

## 2019-06-17 DIAGNOSIS — Z66 Do not resuscitate: Secondary | ICD-10-CM

## 2019-06-17 DIAGNOSIS — Z7189 Other specified counseling: Secondary | ICD-10-CM

## 2019-06-17 LAB — GLUCOSE, CAPILLARY
Glucose-Capillary: 218 mg/dL — ABNORMAL HIGH (ref 70–99)
Glucose-Capillary: 222 mg/dL — ABNORMAL HIGH (ref 70–99)
Glucose-Capillary: 224 mg/dL — ABNORMAL HIGH (ref 70–99)
Glucose-Capillary: 253 mg/dL — ABNORMAL HIGH (ref 70–99)
Glucose-Capillary: 253 mg/dL — ABNORMAL HIGH (ref 70–99)
Glucose-Capillary: 272 mg/dL — ABNORMAL HIGH (ref 70–99)

## 2019-06-17 LAB — CBC WITH DIFFERENTIAL/PLATELET
Abs Immature Granulocytes: 0.09 10*3/uL — ABNORMAL HIGH (ref 0.00–0.07)
Basophils Absolute: 0 10*3/uL (ref 0.0–0.1)
Basophils Relative: 0 %
Eosinophils Absolute: 0.1 10*3/uL (ref 0.0–0.5)
Eosinophils Relative: 1 %
HCT: 20.6 % — ABNORMAL LOW (ref 39.0–52.0)
Hemoglobin: 6 g/dL — CL (ref 13.0–17.0)
Immature Granulocytes: 2 %
Lymphocytes Relative: 10 %
Lymphs Abs: 0.6 10*3/uL — ABNORMAL LOW (ref 0.7–4.0)
MCH: 26.7 pg (ref 26.0–34.0)
MCHC: 29.1 g/dL — ABNORMAL LOW (ref 30.0–36.0)
MCV: 91.6 fL (ref 80.0–100.0)
Monocytes Absolute: 1 10*3/uL (ref 0.1–1.0)
Monocytes Relative: 16 %
Neutro Abs: 4.3 10*3/uL (ref 1.7–7.7)
Neutrophils Relative %: 71 %
Platelets: 182 10*3/uL (ref 150–400)
RBC: 2.25 MIL/uL — ABNORMAL LOW (ref 4.22–5.81)
RDW: 15.9 % — ABNORMAL HIGH (ref 11.5–15.5)
WBC: 6 10*3/uL (ref 4.0–10.5)
nRBC: 0 % (ref 0.0–0.2)

## 2019-06-17 LAB — BASIC METABOLIC PANEL
Anion gap: 10 (ref 5–15)
BUN: 107 mg/dL — ABNORMAL HIGH (ref 8–23)
CO2: 18 mmol/L — ABNORMAL LOW (ref 22–32)
Calcium: 7.7 mg/dL — ABNORMAL LOW (ref 8.9–10.3)
Chloride: 120 mmol/L — ABNORMAL HIGH (ref 98–111)
Creatinine, Ser: 3.39 mg/dL — ABNORMAL HIGH (ref 0.61–1.24)
GFR calc Af Amer: 19 mL/min — ABNORMAL LOW (ref >60)
GFR calc non Af Amer: 16 mL/min — ABNORMAL LOW (ref >60)
Glucose, Bld: 226 mg/dL — ABNORMAL HIGH (ref 70–99)
Potassium: 5.4 mmol/L — ABNORMAL HIGH (ref 3.5–5.1)
Sodium: 148 mmol/L — ABNORMAL HIGH (ref 135–145)

## 2019-06-17 MED ORDER — PHENYLEPHRINE CONCENTRATED 100MG/250ML (0.4 MG/ML) INFUSION SIMPLE
0.0000 ug/min | INTRAVENOUS | Status: DC
  Administered 2019-06-17: 150 ug/min via INTRAVENOUS
  Administered 2019-06-17: 20 ug/min via INTRAVENOUS
  Filled 2019-06-17 (×4): qty 250
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
## 2019-06-17 NOTE — Progress Notes (Signed)
CDS updated on patient's worsening condition, requested update if formal brain death testing conducted.  Candy Sledge, RN

## 2019-06-17 NOTE — Progress Notes (Signed)
Spoke with on call Trauma MD regarding hypotension (Map <60).  Was given orders to administer Phenylephrine to achieve a Map >60. Will continue to monitor and act accordingly.

## 2019-06-17 NOTE — Progress Notes (Signed)
Patient's wife expressed desire to change code status from full code to DNR. Communicated to Trauma MD.  Candy Sledge, RN

## 2019-06-17 NOTE — Progress Notes (Signed)
Received call from RN saying brown cap broke off CVC. Arrived to find hemostats on line. No cap or connection on end.Unable to repair or place new cap. Explained this to RN. She will follow up with MD.

## 2019-06-17 NOTE — Progress Notes (Signed)
Assisted tele visit to patient with several family members.  Mikal Wisman, Janace Hoard, RN

## 2019-06-17 NOTE — Progress Notes (Signed)
Nutrition Brief Note  Chart reviewed. Per MD CT shows R hemisphere infarct which is considered nonsurvivable.  TF on hold, MD has recommended comfort care.  No further nutrition interventions planned at this time.  Please re-consult as needed.   Lockie Pares., RD, LDN, CNSC See AMiON for contact information

## 2019-06-17 NOTE — Progress Notes (Signed)
Patient ID: Zachary Hoffman, male   DOB: 12/14/1940, 79 y.o.   MRN: [id number] I had a long talk with the patient's wife and with his daughter regarding the patient's condition and prognosis.  Have tried to answer all of the questions the best of my ability.  I think they have reached a consensus to withdraw care to comfort care.  I think this is a reasonable decision.  We will pray for them and I offered my condolences.

## 2019-06-18 DIAGNOSIS — Z7189 Other specified counseling: Secondary | ICD-10-CM

## 2019-06-18 DIAGNOSIS — Z515 Encounter for palliative care: Secondary | ICD-10-CM

## 2019-06-18 DIAGNOSIS — Z66 Do not resuscitate: Secondary | ICD-10-CM

## 2019-06-18 LAB — CBC WITH DIFFERENTIAL/PLATELET
Abs Immature Granulocytes: 0.09 10*3/uL — ABNORMAL HIGH (ref 0.00–0.07)
Basophils Absolute: 0 10*3/uL (ref 0.0–0.1)
Basophils Relative: 0 %
Eosinophils Absolute: 0.1 10*3/uL (ref 0.0–0.5)
Eosinophils Relative: 1 %
HCT: 24.1 % — ABNORMAL LOW (ref 39.0–52.0)
Hemoglobin: 7.1 g/dL — ABNORMAL LOW (ref 13.0–17.0)
Immature Granulocytes: 1 %
Lymphocytes Relative: 6 %
Lymphs Abs: 0.6 10*3/uL — ABNORMAL LOW (ref 0.7–4.0)
MCH: 27.1 pg (ref 26.0–34.0)
MCHC: 29.5 g/dL — ABNORMAL LOW (ref 30.0–36.0)
MCV: 92 fL (ref 80.0–100.0)
Monocytes Absolute: 1.6 10*3/uL — ABNORMAL HIGH (ref 0.1–1.0)
Monocytes Relative: 15 %
Neutro Abs: 7.8 10*3/uL — ABNORMAL HIGH (ref 1.7–7.7)
Neutrophils Relative %: 77 %
Platelets: 230 10*3/uL (ref 150–400)
RBC: 2.62 MIL/uL — ABNORMAL LOW (ref 4.22–5.81)
RDW: 16.2 % — ABNORMAL HIGH (ref 11.5–15.5)
WBC: 10.3 10*3/uL (ref 4.0–10.5)
nRBC: 0 % (ref 0.0–0.2)

## 2019-06-18 LAB — BASIC METABOLIC PANEL
Anion gap: 12 (ref 5–15)
BUN: 141 mg/dL — ABNORMAL HIGH (ref 8–23)
CO2: 20 mmol/L — ABNORMAL LOW (ref 22–32)
Calcium: 9.3 mg/dL (ref 8.9–10.3)
Chloride: 119 mmol/L — ABNORMAL HIGH (ref 98–111)
Creatinine, Ser: 4.42 mg/dL — ABNORMAL HIGH (ref 0.61–1.24)
GFR calc Af Amer: 14 mL/min — ABNORMAL LOW (ref >60)
GFR calc non Af Amer: 12 mL/min — ABNORMAL LOW (ref >60)
Glucose, Bld: 316 mg/dL — ABNORMAL HIGH (ref 70–99)
Potassium: 6.4 mmol/L (ref 3.5–5.1)
Sodium: 151 mmol/L — ABNORMAL HIGH (ref 135–145)

## 2019-06-18 LAB — POTASSIUM
Potassium: 6.6 mmol/L (ref 3.5–5.1)
Potassium: 6.6 mmol/L (ref 3.5–5.1)

## 2019-06-18 LAB — GLUCOSE, CAPILLARY
Glucose-Capillary: 243 mg/dL — ABNORMAL HIGH (ref 70–99)
Glucose-Capillary: 376 mg/dL — ABNORMAL HIGH (ref 70–99)
Glucose-Capillary: 472 mg/dL — ABNORMAL HIGH (ref 70–99)
Glucose-Capillary: 369 mg/dL — ABNORMAL HIGH (ref 70–99)
Glucose-Capillary: 365 mg/dL — ABNORMAL HIGH (ref 70–99)
Glucose-Capillary: 364 mg/dL — ABNORMAL HIGH (ref 70–99)

## 2019-06-18 MED ORDER — DEXTROSE 50 % IV SOLN
INTRAVENOUS | Status: AC
  Administered 2019-06-18: 50 mL via INTRAVENOUS
  Filled 2019-06-18: qty 50

## 2019-06-18 MED ORDER — INSULIN ASPART 100 UNIT/ML IV SOLN
10.0000 [IU] | Freq: Once | INTRAVENOUS | Status: AC
  Administered 2019-06-18: 15:00:00 10 [IU] via INTRAVENOUS

## 2019-06-18 MED ORDER — MIDAZOLAM 50MG/50ML (1MG/ML) PREMIX INFUSION: 0.5000 mg/h | INTRAVENOUS | Status: DC

## 2019-06-18 MED ORDER — POLYVINYL ALCOHOL 1.4 % OP SOLN
2.0000 [drp] | OPHTHALMIC | Status: DC | PRN
  Filled 2019-06-18: qty 15

## 2019-06-18 MED ORDER — BISACODYL 10 MG RE SUPP: 10.0000 mg | Freq: Every day | RECTAL | Status: DC | PRN

## 2019-06-18 MED ORDER — DEXTROSE 50 % IV SOLN
50.0000 mL | Freq: Once | INTRAVENOUS | Status: AC
  Administered 2019-06-18: 09:00:00 50 mL via INTRAVENOUS

## 2019-06-18 MED ORDER — INSULIN ASPART 100 UNIT/ML ~~LOC~~ SOLN
9.0000 [IU] | Freq: Once | SUBCUTANEOUS | Status: AC
  Administered 2019-06-18: 9 [IU] via SUBCUTANEOUS

## 2019-06-18 MED ORDER — FENTANYL 2500MCG IN NS 250ML (10MCG/ML) PREMIX INFUSION
0.0000 ug/h | INTRAVENOUS | Status: DC
  Administered 2019-06-18: 17:00:00 75 ug/h via INTRAVENOUS
  Filled 2019-06-18: qty 250

## 2019-06-18 MED ORDER — GLYCOPYRROLATE 0.2 MG/ML IJ SOLN
0.4000 mg | INTRAMUSCULAR | Status: DC
  Administered 2019-06-18: 0.4 mg via INTRAVENOUS
  Filled 2019-06-18: qty 2

## 2019-06-18 MED ORDER — SODIUM ZIRCONIUM CYCLOSILICATE 10 G PO PACK
10.0000 g | PACK | Freq: Once | ORAL | Status: AC
Start: 2019-06-18 — End: 2019-06-18
  Administered 2019-06-18: 12:00:00 10 g via ORAL
  Filled 2019-06-18: qty 1

## 2019-06-18 MED ORDER — GLYCOPYRROLATE 0.2 MG/ML IJ SOLN: 0.1000 mg | Freq: Once | INTRAMUSCULAR | Status: DC

## 2019-06-18 MED ORDER — INSULIN ASPART 100 UNIT/ML IV SOLN
10.0000 [IU] | Freq: Once | INTRAVENOUS | Status: AC
  Administered 2019-06-18: 10 [IU] via INTRAVENOUS

## 2019-06-18 MED ORDER — DEXTROSE 50 % IV SOLN
50.0000 mL | Freq: Once | INTRAVENOUS | Status: AC
  Filled 2019-06-18: qty 50

## 2019-06-20 ENCOUNTER — Encounter: Payer: Self-pay | Admitting: Podiatry

## 2019-06-21 ENCOUNTER — Encounter (HOSPITAL_COMMUNITY): Payer: Medicare Other

## 2019-07-07 NOTE — Progress Notes (Signed)
Patient ID: Zachary Hoffman, male   DOB: [birth year], 79 y.o.   MRN: [id number] BP (!) 127/56   Pulse 78   Temp (!) 97.1 F (36.2 C) (Oral)   Resp 12   Ht 5\' 7"  (1.702 m)   Wt 86.1 kg   SpO2 100%   BMI 29.73 kg/m  Comatose, on vent Gra[place] prognosis No new recommendations.

## 2019-07-07 NOTE — Progress Notes (Signed)
Patient POC CBG>600, re-checked and CBG reading 472. SSI given and MD notified, will re-check in 30 minutes.  Candy Sledge, RN

## 2019-07-07 NOTE — Progress Notes (Signed)
Pt extubated using withdrawal guidelines.  RN @ bedside. 

## 2019-07-07 NOTE — Progress Notes (Signed)
CRITICAL VALUE ALERT  Critical Value:  K 6.4  Date & Time Notifiied:  2019/07/18 @ 0100  Provider Notified: Barry Dienes MD  Orders Received/Actions taken: see [facility]

## 2019-07-07 NOTE — Progress Notes (Signed)
CRITICAL VALUE ALERT  Critical Value:  K 6.6  Date & Time Notied:   2019-06-25  1429  Provider Notified: Dr. Barry Dienes
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
## 2019-07-07 NOTE — Progress Notes (Signed)
Two RN's Myself and Candy Sledge auscultated for heart sounds and none were heard.   Patient time of death occurred at 24 on 2019/07/01.  Attending physician notified [phone].
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
## 2019-07-07 NOTE — Progress Notes (Signed)
Family indicated readiness for compassionate extubation, spiritual support offered and declined.  Candy Sledge, RN

## 2019-07-07 NOTE — Progress Notes (Signed)
CRITICAL VALUE ALERT  Critical Value:  K 6.6  Date & Time Notied:  11-Jul-2019 1030am  Provider Notified: Dr. Barry Dienes  Orders Received/Actions taken: Orders for lokelma, stop tube feeds and to repeat a K in 2 hours given
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
## 2019-07-07 NOTE — Progress Notes (Signed)
F/U POC CBG 376, MD notified and one time additional dose of 9u insulin given.  Candy Sledge, RN
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
## 2019-07-07 DEATH — deceased

## 2019-08-26 ENCOUNTER — Ambulatory Visit: Payer: Medicare Other | Admitting: Podiatry

## 2021-07-26 IMAGING — DX DG CHEST 1V PORT
1 series · 1 of 1 positions shown · non-contrast
Comparison: June 09, 2019

CLINICAL DATA: Endotracheal tube and central line placement

EXAM:
PORTABLE CHEST 1 VIEW

[chest ap]
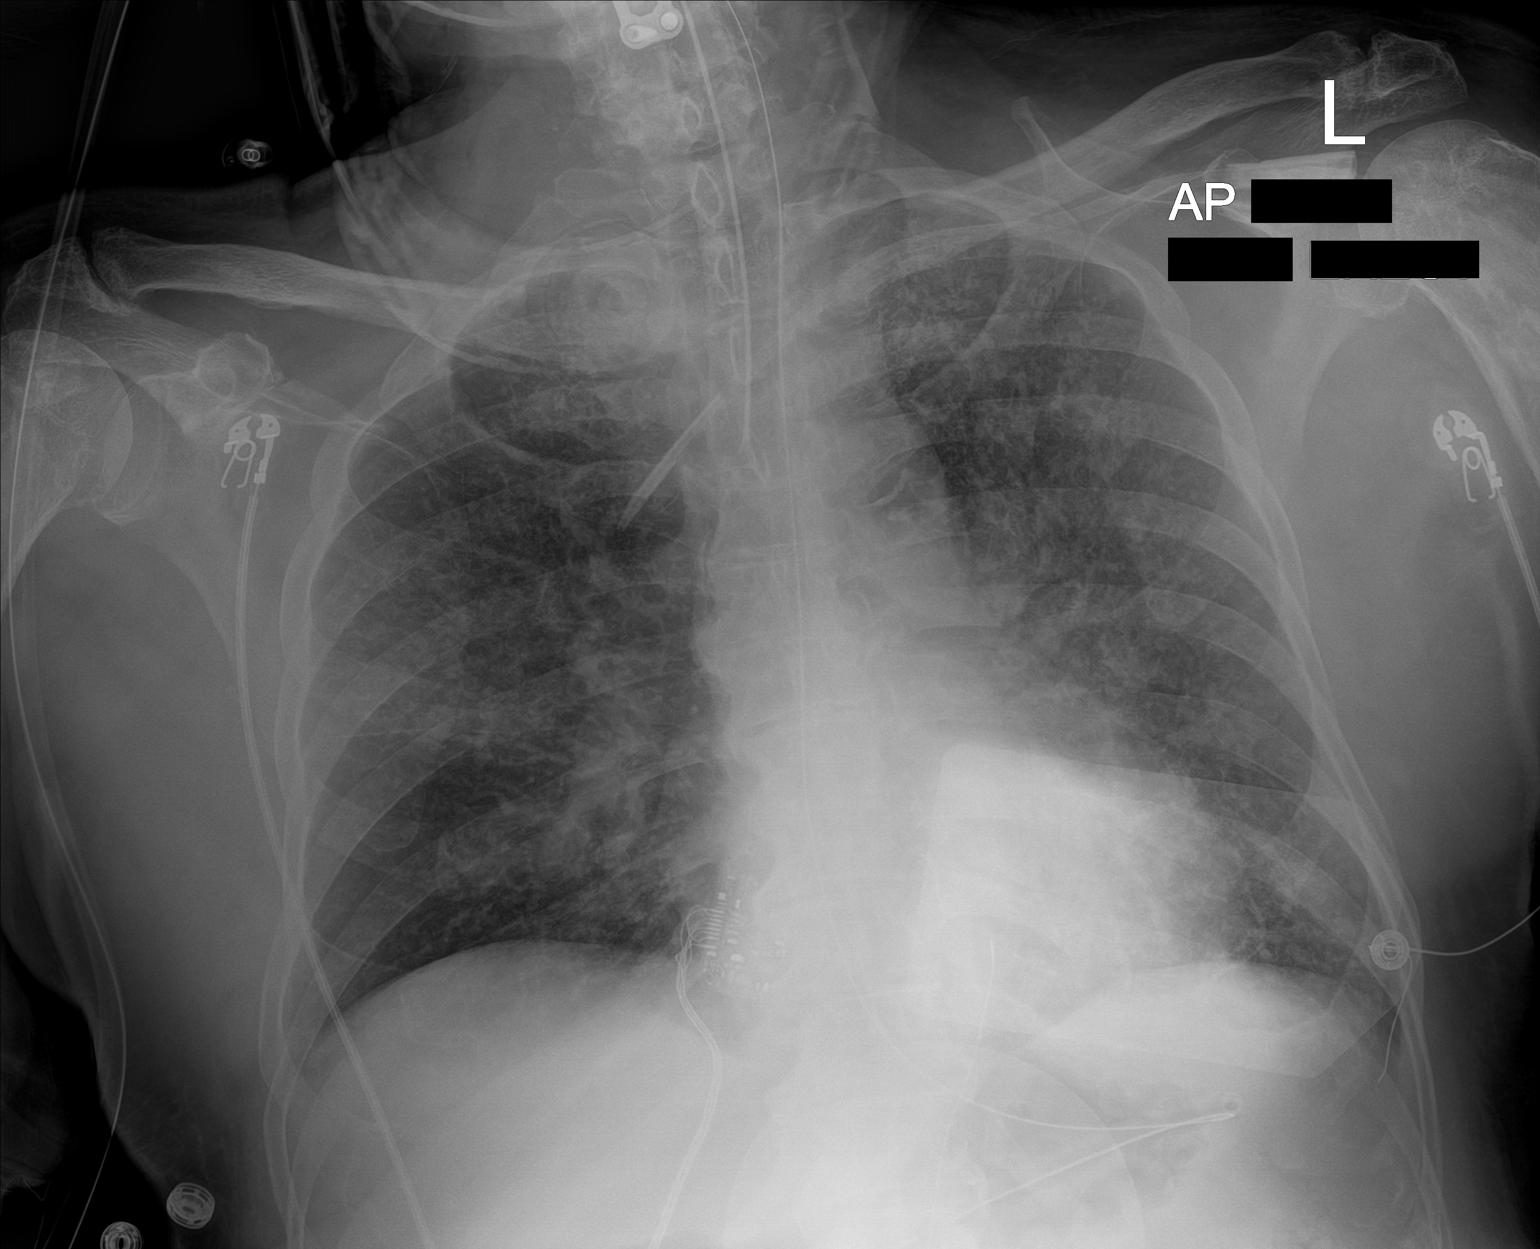

[1 of 1 positions shown; findings below may reference images not displayed]

FINDINGS: The endotracheal tube terminates approximately 2.8 cm above the
carina. The enteric tube extends below the left hemidiaphragm. There
is a left subclavian central venous catheter in place with tip
projecting over the SVC. There is no pneumothorax. The heart size is
stable from prior study. There is no large pleural effusion. New
hazy bilateral airspace opacities with prominent interstitial lung
markings are noted.
IMPRESSION: 1. Lines and tubes as above.  No pneumothorax.
2. Findings suspicious for developing pulmonary edema in the
appropriate clinical setting.

## 2021-07-26 IMAGING — DX DG ABD PORTABLE 1V
1 series · 1 of 1 positions shown · non-contrast
Comparison: None.

CLINICAL DATA: OG tube placement

EXAM:
PORTABLE ABDOMEN - 1 VIEW

[abdomen kub]
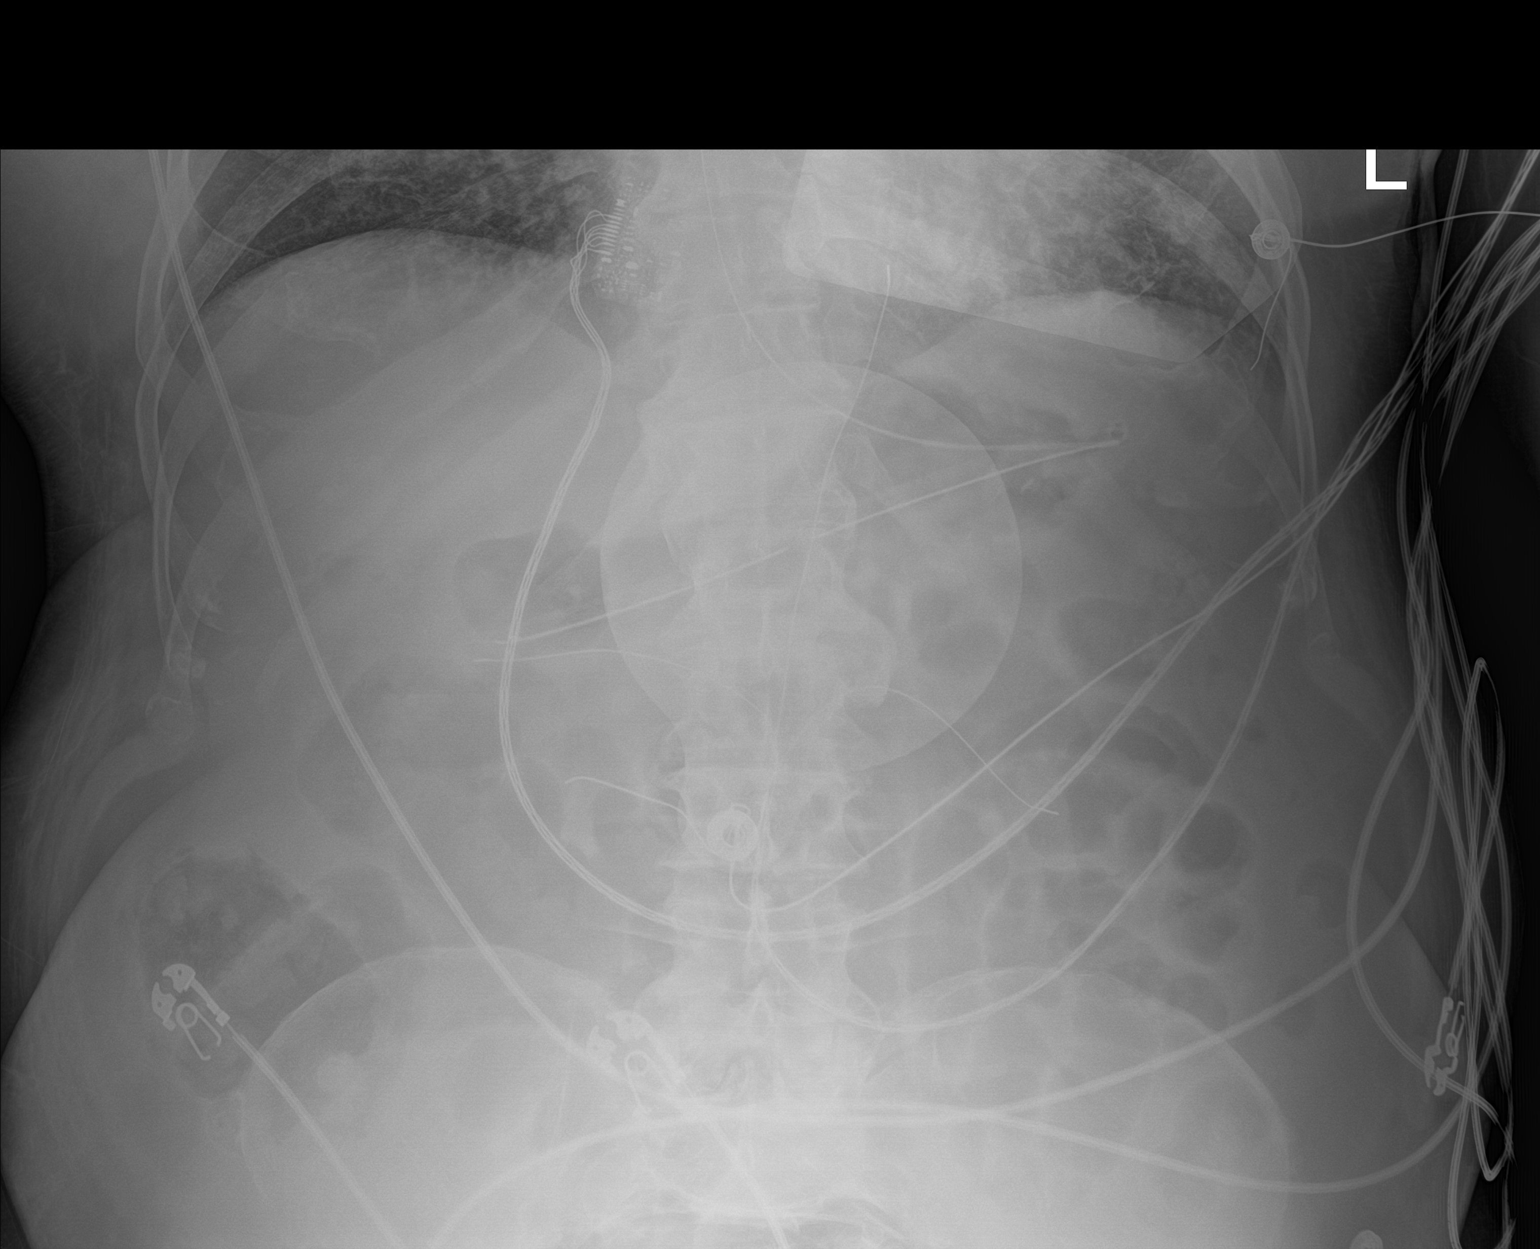

[1 of 1 positions shown; findings below may reference images not displayed]

FINDINGS: The OG tube projects over the gastric antrum/pylorus. The tip is
pointed distally. The bowel gas pattern is nonspecific and
nonobstructive.
IMPRESSION: OG tube projects over the gastric antrum/pylorus. The tip is pointed
distally.
